# Patient Record
Sex: Female | Born: 1955 | Race: White | Hispanic: No | Marital: Single | Smoking: Former smoker
Health system: Southern US, Community
[De-identification: ages and names within clinical notes are randomized; demographics above are authoritative.]

## PROBLEM LIST (undated history)

## (undated) DIAGNOSIS — S52502A Unspecified fracture of the lower end of left radius, initial encounter for closed fracture: Secondary | ICD-10-CM

## (undated) DIAGNOSIS — T7840XA Allergy, unspecified, initial encounter: Secondary | ICD-10-CM

## (undated) DIAGNOSIS — Q2730 Arteriovenous malformation, site unspecified: Secondary | ICD-10-CM

## (undated) DIAGNOSIS — E039 Hypothyroidism, unspecified: Secondary | ICD-10-CM

## (undated) DIAGNOSIS — J45909 Unspecified asthma, uncomplicated: Secondary | ICD-10-CM

## (undated) DIAGNOSIS — F419 Anxiety disorder, unspecified: Secondary | ICD-10-CM

## (undated) DIAGNOSIS — K219 Gastro-esophageal reflux disease without esophagitis: Secondary | ICD-10-CM

## (undated) DIAGNOSIS — M797 Fibromyalgia: Secondary | ICD-10-CM

## (undated) DIAGNOSIS — E785 Hyperlipidemia, unspecified: Secondary | ICD-10-CM

## (undated) DIAGNOSIS — M722 Plantar fascial fibromatosis: Secondary | ICD-10-CM

## (undated) DIAGNOSIS — R569 Unspecified convulsions: Secondary | ICD-10-CM

## (undated) HISTORY — DX: Arteriovenous malformation, site unspecified: Q27.30

## (undated) HISTORY — DX: Hyperlipidemia, unspecified: E78.5

## (undated) HISTORY — DX: Fibromyalgia: M79.7

## (undated) HISTORY — PX: CHOLECYSTECTOMY: SHX55

## (undated) HISTORY — PX: CRANIOTOMY: SHX93

## (undated) HISTORY — DX: Allergy, unspecified, initial encounter: T78.40XA

## (undated) HISTORY — DX: Plantar fascial fibromatosis: M72.2

## (undated) HISTORY — DX: Hypothyroidism, unspecified: E03.9

## (undated) HISTORY — DX: Unspecified asthma, uncomplicated: J45.909

---

## 2003-10-06 ENCOUNTER — Encounter: Payer: Self-pay | Admitting: Cardiology

## 2008-07-16 ENCOUNTER — Ambulatory Visit: Payer: Self-pay | Admitting: Vascular Surgery

## 2008-07-16 ENCOUNTER — Encounter: Payer: Self-pay | Admitting: Cardiology

## 2010-07-18 ENCOUNTER — Encounter: Payer: Self-pay | Admitting: Cardiology

## 2010-09-02 ENCOUNTER — Encounter: Payer: Self-pay | Admitting: Cardiology

## 2010-09-18 ENCOUNTER — Encounter: Payer: Self-pay | Admitting: Cardiology

## 2010-12-02 ENCOUNTER — Encounter: Payer: Self-pay | Admitting: Cardiology

## 2011-01-06 ENCOUNTER — Encounter: Payer: Self-pay | Admitting: Cardiology

## 2011-01-06 ENCOUNTER — Ambulatory Visit
Admission: RE | Admit: 2011-01-06 | Discharge: 2011-01-06 | Payer: Self-pay | Source: Home / Self Care | Attending: Cardiology | Admitting: Cardiology

## 2011-01-06 DIAGNOSIS — R002 Palpitations: Secondary | ICD-10-CM | POA: Insufficient documentation

## 2011-01-06 DIAGNOSIS — R0602 Shortness of breath: Secondary | ICD-10-CM | POA: Insufficient documentation

## 2011-01-15 NOTE — Letter (Signed)
Summary: External Correspondence/ [facility]  External Correspondence/ [facility]   Imported By: Dorise Hiss 12/04/2010 14:32:36  _____________________________________________________________________  External Attachment:    Type:   Image     Comment:   External Document

## 2011-01-15 NOTE — Medication Information (Signed)
Summary: Environmental health practitioner [facility]  RX Folder/ [facility]   Imported By: Dorise Hiss 12/04/2010 14:31:14  _____________________________________________________________________  External Attachment:    Type:   Image     Comment:   External Document

## 2011-01-15 NOTE — Assessment & Plan Note (Signed)
Summary: NP-TACHYCARDIA X 2WEEKS EPISOCID?   Visit Type:  Initial Consult Primary Provider:  [facility], [facility]   History of Present Illness: 55 year old woman referred for cardiology consultation. She reports a few month history of intermittent palpitations, described as a fast heartbeat, occurs suddenly, often at rest. Symptoms usually last for a few minutes, but sometimes for "several" minutes. She notices associated shortness of breath and nausea, no syncope or chest pain however. There is no clear precipitant for these symptoms, no exertional component. Otherwise she states that she feels "okay."  She denies any prior history of symptoms in earlier years. No history of cardiac arrhythmia.  Faxed copy of old ECG from 2004 shows sinus rhythm at 85 beats per minutes with low voltage, left anterior fascicular block, and decreased R wave progression.  Preventive Screening-Counseling & Management  Alcohol-Tobacco     Smoking Status: quit     Year Started: 1974     Year Quit: 2001     Pack years: 2pks/day 27years  Current Medications (verified): 1)  Synthroid 125 Mcg Tabs (Levothyroxine Sodium) .... Take 1 Tablet By Mouth Once A Day 2)  Proair Hfa 108 (90 Base) Mcg/act Aers (Albuterol Sulfate) .... 2 Puffs Four Times A Day As Needed 3)  Savella 50 Mg Tabs (Milnacipran Hcl) .... Take 1 Tablet By Mouth Two Times A Day 4)  Zanaflex 4 Mg Tabs (Tizanidine Hcl) .... Take 1 Tablet By Mouth Three Times A Day 5)  Singulair 10 Mg Tabs (Montelukast Sodium) .... Take 1 Tablet By Mouth Once A Day 6)  Cetirizine Hcl 10 Mg Tabs (Cetirizine Hcl) .... Take 1 Tablet By Mouth Once A Day 7)  Stool Softener 100 Mg Caps (Docusate Sodium) .... Take 1 Tablet By Mouth Two Times A Day 8)  Aspir-Low 81 Mg Tbec (Aspirin) .... Take 1 Tablet By Mouth Once A Day 9)  Alprazolam 0.5 Mg Tabs (Alprazolam) .... Take 1 Tablet By Mouth Two Times A Day As Needed 10)  Vitamin D3 2000 Unit Caps  (Cholecalciferol) .... Take 1 Tablet By Mouth Two Times A Day 11)  Super B Complex  Tabs (B Complex-C) .... Take 1 Tablet By Mouth Two Times A Day 12)  Caltrate 600 1500 Mg Tabs (Calcium Carbonate) .... Take 1 Tablet By Mouth Two Times A Day 13)  Zinc 50 Mg Tabs (Zinc) .... Take 1 Tablet By Mouth Two Times A Day 14)  Multivitamins  Tabs (Multiple Vitamin) .... Take 1 Tablet By Mouth Once A Day 15)  Kelp  Tabs (Iodine (Kelp)) .... Take 1 Tablet By Mouth Once A Day 16)  Ibuprofen 800 Mg Tabs (Ibuprofen) .... Take 1 Tablet By Mouth Two Times A Day As Needed  Allergies (verified): 1)  ! Celexa 2)  ! * Tramadol 3)  ! * Cherry Flavor  Comments:  Administrator, Civil Service: The patient's medication list and allergies were reviewed with the patient and were updated in the Medication and Allergy Lists.  Past History:  Past Surgical History: Last updated: 01/05/2011 Cholecystectomy Craniotomy for AVM  Family History: Last updated: January 25, 2011 Father: died age 88 with emphysema Mother: no reported cardiac disease Siblings: no reported cardiac disease  Social History: Last updated: 01-25-2011 Retired  Tobacco Use - Former.  Alcohol Use - no Married   Past Medical History: Hypothyroidism Hyperlipidemia Asthma Intracranial AVM with seizures Fibromyalgia  Family History: Father: died age 7 with emphysema Mother: no reported cardiac disease Siblings: no reported cardiac disease  Social History: Retired  Tobacco  Use - Former.  Alcohol Use - no Married  Pack years:  2pks/day 27years  Review of Systems  The patient denies anorexia, fever, weight loss, chest pain, syncope, dyspnea on exertion, peripheral edema, prolonged cough, hemoptysis, melena, hematochezia, and severe indigestion/heartburn.         Describes some skin changes in her hands suggestive of Raynaud's phenomenon, otherwise reviewed and negative except as outlined.  Vital Signs:  Patient profile:   55 year old  female Height:      60 inches Weight:      191 pounds BMI:     37.44 Pulse rate:   79 / minute BP sitting:   123 / 76  (left arm) Cuff size:   large  Vitals Entered By: Carlye Grippe (January 06, 2011 8:28 AM)  Nutrition Counseling: Patient's BMI is greater than 25 and therefore counseled on weight management options.  Physical Exam  Additional Exam:  Overweight woman in no acute distress. HEENT: Conjunctiva and lids normal, oropharynx with moist mucosa. Neck: Supple, no elevated JVP or bruits, no thyromegaly or thyroid tenderness. Lungs: Clear to auscultation, nonlabored. Cardiac: Regular rate and rhythm, no S3, no significant systolic murmur. Abdomen: Soft, nontender, bowel sounds present. Skin: Warm and dry area of Musculoskeletal: No kyphosis. Extremities: No pitting edema, distal pulses full. Neuropsychiatric: Alert and oriented x3, affect appropriate.   EKG  Procedure date:  01/06/2011  Findings:      Normal sinus rhythm at 76 beats per minute, low voltage, PR interval 124 ms, no definite delta waves.  Impression & Recommendations:  Problem # 1:  PALPITATIONS, RECURRENT (ICD-785.1)  Episodic, as described above. No associated chest pain or syncope. Possibility of PSVT or perhaps even paroxysmal atrial fibrillation to be considered. Baseline ECG shows a short PR interval, no definite evidence of preexcitation however. No major findings noted on cardiac examination. Plan at this point is to provide an event recorder to obtain more objective information in association with symptoms. Then bring her back to the office to discuss the results. No new medications added.  Her updated medication list for this problem includes:    Aspir-low 81 Mg Tbec (Aspirin) .Marland Kitchen... Take 1 tablet by mouth once a day  Orders: Cardionet/Event Monitor (Cardionet/Event)  Problem # 2:  DYSPNEA (ICD-786.05)  Noted in association with above symptoms, no progressive dyspnea on exertion however.  Patient reports good control of her asthma, very infrequent inhaler use.  Her updated medication list for this problem includes:    Aspir-low 81 Mg Tbec (Aspirin) .Marland Kitchen... Take 1 tablet by mouth once a day  Other Orders: EKG w/ Interpretation (93000)  Patient Instructions: 1)  Follow up with Dr. Diona Browner on Friday, February 06, 2011 at 2pm. 2)  Your physician has recommended that you wear an event monitor.  Event monitors are medical devices that record the heart's electrical activity. Doctors most often use these monitors to diagnose arrhythmias. Arrhythmias are problems with the speed or rhythm of the heartbeat. The monitor is a small, portable device. You can wear one while you do your normal daily activities. This is usually used to diagnose what is causing palpitations/syncope (passing out). 3)  Your physician recommends that you continue on your current medications as directed. Please refer to the Current Medication list given to you today.

## 2011-01-15 NOTE — Consult Note (Signed)
Summary: Consultation Report/  VASCULAR  Consultation Report/  VASCULAR   Imported By: Dorise Hiss 01/06/2011 08:32:58  _____________________________________________________________________  External Attachment:    Type:   Image     Comment:   External Document

## 2011-01-21 NOTE — Letter (Signed)
Summary: Internal Other/ PATIENT HISTORY FORM  Internal Other/ PATIENT HISTORY FORM   Imported By: Dorise Hiss 01/14/2011 10:48:14  _____________________________________________________________________  External Attachment:    Type:   Image     Comment:   External Document

## 2011-02-03 ENCOUNTER — Encounter: Payer: Self-pay | Admitting: Cardiology

## 2011-02-06 ENCOUNTER — Encounter: Payer: Self-pay | Admitting: Physician Assistant

## 2011-02-06 ENCOUNTER — Ambulatory Visit (INDEPENDENT_AMBULATORY_CARE_PROVIDER_SITE_OTHER): Payer: PRIVATE HEALTH INSURANCE | Admitting: Cardiology

## 2011-02-06 DIAGNOSIS — R0602 Shortness of breath: Secondary | ICD-10-CM

## 2011-02-06 DIAGNOSIS — E039 Hypothyroidism, unspecified: Secondary | ICD-10-CM | POA: Insufficient documentation

## 2011-02-06 DIAGNOSIS — R002 Palpitations: Secondary | ICD-10-CM

## 2011-02-10 NOTE — Procedures (Signed)
Summary: [facility] Final Summary  [facility] Final Summary   Imported By: Cyril Loosen, RN, BSN 02/05/2011 16:57:25  _____________________________________________________________________  External Attachment:    Type:   Image     Comment:   External Document  Appended Document: [facility] Final Summary Pt will be notified of results at ov today

## 2011-02-10 NOTE — Assessment & Plan Note (Signed)
Summary: 3 WK F/U PER 1/24 OV-JM   Visit Type:  Follow-up Primary Provider:  Prudy Feeler, PA   History of Present Illness: patient returns in followup, for review of a recent event monitor, per Dr. Diona Browner, for evaluation of palpitations with associated dyspnea and chest pain. She recently presented to [place] here in the clinic for initial consultation, with no prior history of heart disease.  The results of the study yielded NSR with occasional sinus tachycardia at 150 bpm, and rare PVCs. There was no definite evidence of atrial fibrillation/flutter, or PSVT. Of note, the patient did report SOB and CP, during which she was in documented NSR in the 90 bpm range.  Patient otherwise denies exertional CP or SOB. Her palpitations have been occurring over the last 2-3 months, typically several times a week.  Of note, patient has hypothyroidism and is on Synthroid. She had a TSH of level of 0.26, back in September, with a normal T4.  Preventive Screening-Counseling & Management  Alcohol-Tobacco     Smoking Status: quit     Year Quit: 10-15 yrs  Current Medications (verified): 1)  Synthroid 125 Mcg Tabs (Levothyroxine Sodium) .... Take 1 Tablet By Mouth Once A Day 2)  Proair Hfa 108 (90 Base) Mcg/act Aers (Albuterol Sulfate) .... 2 Puffs Four Times A Day As Needed 3)  Savella 50 Mg Tabs (Milnacipran Hcl) .... Take 1 Tablet By Mouth Two Times A Day 4)  Zanaflex 4 Mg Tabs (Tizanidine Hcl) .... Take 1 Tablet By Mouth Three Times A Day 5)  Singulair 10 Mg Tabs (Montelukast Sodium) .... Take 1 Tablet By Mouth Once A Day 6)  Cetirizine Hcl 10 Mg Tabs (Cetirizine Hcl) .... Take 1 Tablet By Mouth Once A Day 7)  Stool Softener 100 Mg Caps (Docusate Sodium) .... Take 1 Tablet By Mouth Two Times A Day 8)  Aspir-Low 81 Mg Tbec (Aspirin) .... Take 1 Tablet By Mouth Once A Day 9)  Alprazolam 0.5 Mg Tabs (Alprazolam) .... Take 1 Tablet By Mouth Two Times A Day As Needed 10)  Vitamin D3 2000 Unit Caps  (Cholecalciferol) .... Take 1 Tablet By Mouth Two Times A Day 11)  Super B Complex  Tabs (B Complex-C) .... Take 1 Tablet By Mouth Two Times A Day 12)  Caltrate 600 1500 Mg Tabs (Calcium Carbonate) .... Take 1 Tablet By Mouth Two Times A Day 13)  Zinc 50 Mg Tabs (Zinc) .... Take 1 Tablet By Mouth Two Times A Day 14)  Multivitamins  Tabs (Multiple Vitamin) .... Take 1 Tablet By Mouth Once A Day 15)  Kelp  Tabs (Iodine (Kelp)) .... Take 1 Tablet By Mouth Once A Day 16)  Ibuprofen 800 Mg Tabs (Ibuprofen) .... Take 1 Tablet By Mouth Two Times A Day As Needed  Allergies (verified): 1)  ! Celexa 2)  ! * Tramadol 3)  ! * Cherry Flavor  Comments:  Nurse/Medical Assistant: Patient states all meds are the same as last  visit.  Did bring list at last OV.  Did not bring list or bottles today.  Past History:  Past Medical History: Last updated: 01/06/2011 Hypothyroidism Hyperlipidemia Asthma Intracranial AVM with seizures Fibromyalgia  Review of Systems       No fevers, chills, hemoptysis, dysphagia, melena, hematocheezia, hematuria, rash, claudication, orthopnea, pnd, pedal edema. All other systems negative.   Vital Signs:  Patient profile:   55 year old female Weight:      195.25 pounds Pulse rate:   78 /  minute BP sitting:   113 / 78  (left arm) Cuff size:   regular  Vitals Entered By: Hoover Brunette, LPN (February 06, 2011 2:12 PM) Is Patient Diabetic? No Comments 3 week f/u   Physical Exam  Additional Exam:  GEN: 55 year old female, obese, sitting upright, no distress HEENT: NCAT,PERRLA,EOMI NECK: palpable pulses, no bruits; no JVD; no TM LUNGS: CTA bilaterally HEART: RRR (S1S2); no significant murmurs; no rubs; no gallops ABD: protuberant, intact BS EXT: no significant edema SKIN: warm, dry MUSC: no obvious deformity NEURO: A/O (x3)     Impression & Recommendations:  Problem # 1:  PALPITATIONS, RECURRENT (ICD-785.1)  no further workup indicated. recent event  monitor is reassuring, indicating NSR and occasional ST, with no evidence of dysrhythmia. Of note, given the associated SOB with these palpitations, we will order a 2-D echocardiogram for assessment of LVF. If this is reassuring, the patient will be notified by phone, and can resume regular followup with her primary care physician.  Problem # 2:  HYPOTHYROIDISM (ICD-244.9)  the patient has been instructed to to schedule early followup with Dr. Charm Barges, for reassessment of her TSH level. In this context of persistent palpitations, it may be that she needs to cut back on her Synthroid dose. She had a TSH level 0.26, back in September.  Problem # 3:  DYSPNEA (ICD-786.05)  we'll evaluate further with a 2-D echo.  Other Orders: 2-D Echocardiogram (2D Echo)  Patient Instructions: 1)  Your physician has requested that you have an echocardiogram.  Echocardiography is a painless test that uses sound waves to create images of your heart. It provides your doctor with information about the size and shape of your heart and how well your heart's chambers and valves are working.  This procedure takes approximately one hour. There are no restrictions for this procedure. 2)  See Dr. Charm Barges in 1-2 weeks for follow up (need to have TSH done). 3)  Follow up will be based on Echo results.

## 2011-02-13 ENCOUNTER — Encounter: Payer: Self-pay | Admitting: Physician Assistant

## 2011-02-13 DIAGNOSIS — R0602 Shortness of breath: Secondary | ICD-10-CM

## 2011-02-18 ENCOUNTER — Encounter (INDEPENDENT_AMBULATORY_CARE_PROVIDER_SITE_OTHER): Payer: Self-pay | Admitting: *Deleted

## 2011-02-24 NOTE — Letter (Signed)
Summary: Engineer, materials at [facility]  [address]   [place], [place] [address]   Phone: [phone]  Fax: [phone]        February 18, 2011 MRN: [id number]    [facility] [address] [place], [place]  [address]    Dear Ms. Salois,  Your test ordered by Selena Batten has been reviewed by your physician (or physician assistant) and was found to be normal or stable. Your physician (or physician assistant) felt no changes were needed at this time.  _X_ Echocardiogram-No further workup needed. You are cleared to return to primary MD. Follow up with Dr. Diona Browner on an as needed basis  ____ Cardiac Stress Test  ____ Lab Work  ____ Peripheral vascular study of arms, legs or neck  ____ CT scan or X-ray  ____ Lung or Breathing test  ____ Other:   Thank you.   Cyril Loosen, RN, BSN    Duane Boston, M.D., F.A.C.C. Thressa Sheller, M.D., F.A.C.C. Oneal Grout, M.D., F.A.C.C. Cheree Ditto, M.D., F.A.C.C. Daiva Nakayama, M.D., F.A.C.C. Kenney Houseman, M.D., F.A.C.C. Jeanne Ivan, PA-C

## 2011-04-28 NOTE — Procedures (Signed)
LOWER EXTREMITY VENOUS REFLUX EXAM   INDICATION:  Enlarged right foot varicosity.   EXAM:  Using color-flow imaging and pulse Doppler spectral analysis, the  right common femoral, superficial femoral, popliteal, posterior tibial,  greater and lesser saphenous veins are evaluated.  There is no evidence  suggesting deep venous insufficiency in the right lower extremity.   The right saphenofemoral junction is competent.  The right GSV is  competent.   The right proximal short saphenous vein was not adequately visualized.   GSV Diameter (used if found to be incompetent only)                                            Right    Left  Proximal Greater Saphenous Vein           cm       cm  Proximal-to-mid-thigh                     cm       cm  Mid thigh                                 cm       cm  Mid-distal thigh                          cm       cm  Distal thigh                              cm       cm  Knee                                      cm       cm   IMPRESSION:  1. No reflux is noted in the right greater saphenous vein.  2. The right greater saphenous vein is not aneurysmal or tortuous.  3. The deep venous system is competent.  4. There is a cystic structure measuring roughly 0.4 cm x 0.5 cm x 4      cm on the anterolateral right foot which does not appear to be      vascular in nature.  5. There is a Baker's cyst noted in the right popliteal fossa.   ___________________________________________  Quita Skye. Hart Rochester, M.D.   CH/MEDQ  D:  07/16/2008  T:  07/17/2008  Job:  [id number]

## 2011-04-28 NOTE — Consult Note (Signed)
VASCULAR SURGERY CONSULTATION   Alicia Russo, Alicia Russo  DOB:  [birth year]                                       07/16/2008  CHART#:[id number]   The patient was referred for evaluation of a possible varicose vein or  area of thrombophlebitis on the right foot.  This healthy 55 year old  lady noticed a large tender lump on her foot 4 to 8 weeks ago.  She  states that this came on fairly suddenly.  There was no trauma  associated with it.  She has no history of deep venous thrombosis,  thrombophlebitis, pulmonary emboli, varicose veins, edema in her lower  extremities, spider veins or other venous problems.  This has been  treated with an anti-inflammatory medication (Diclofenac  75 mg) with  some improvement.  She was referred for further evaluation.   PAST MEDICAL HISTORY:  1. Asthma.  2. 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  3. Negative for diabetes, hypertension, coronary artery disease,      hyperlipidemia.   PREVIOUS SURGERY:  Includes a craniotomy for the AVM and a  cholecystectomy.   FAMILY HISTORY:  Positive for diabetes in a brother and an uncle.  Negative for coronary artery disease and stroke.   SOCIAL HISTORY:  She is married, has 2 children, is retired.  Smoked 2  packs of cigarettes per day for 30 years, quit in 2005.  Does not use  alcohol.   ALLERGIES:  None.   MEDICATIONS:  Please see health history form.   PHYSICAL EXAM:  Blood pressure 132/70, heart rate 72, respirations 14.  General:  She is a healthy-appearing middle-aged female in no apparent  distress.  alert and oriented x3.  Neck is supple with 3+ carotid pulses  palpable.  No bruits are audible.  Neurologic:  Normal.  No palpable  adenopathy in the neck.  No skin rashes are noted.  Chest:  Clear to  auscultation.  Cardiovascular:  Regular rhythm with no murmurs.  Upper  extremity pulses are 3+ bilaterally.  Abdomen:  Soft, nontender with  no  palpable masses.  Extremity exam reveals 3+ femoral, popliteal and  dorsalis pedis pulses bilaterally.  There is no evidence of any  significant varicosities or spider or reticular veins in the lower  extremity.  No edema is noted.  There is a tubular cystic structure on  the dorsum of the right foot running in a transverse direction, which  does not appear to be connected to the venous system.  It is distended  and is about 4 cm in length and about 0.5 cm in diameter.  It is not red  and inflamed in appearance.  There are no palpable cords in the calf and  no calf tenderness.   Venous duplex exam was performed which revealed no evidence of any  venous disease with no deep venous thrombosis or reflux in the right  great saphenous vein.  Small saphenous vein was not visualized.  The  cystic structure on the right foot does not appear to be a vascular  structure with no flow being noted.  Arterial Dopplers were normal with  normal pressures and wave forms in both feet.   I do not think this is a varicosity or an area of thrombophlebitis.  Possibly, it is a  ganglion cyst although it is somewhat unusual.  I do  not think she needs any further vascular workup and I will refer her  back to Dr. Ulice Brilliant for further evaluation.   Alicia Russo, M.D.  Electronically Signed  JDL/MEDQ  D:  07/16/2008  T:  07/17/2008  Job:  [id number]   cc:   Denny Peon. Ulice Brilliant, D.P.M.  Dr. Prudy Feeler

## 2012-06-03 ENCOUNTER — Encounter: Payer: Self-pay | Admitting: Cardiology

## 2014-12-14 HISTORY — PX: FOOT SURGERY: SHX648

## 2015-01-06 LAB — CBC
HCT: 45.7 % (ref 35.0–47.0)
HCT: 43.8 % (ref 35.0–47.0)
HGB: 15 g/dL (ref 12.0–16.0)
HGB: 14 g/dL (ref 12.0–16.0)
MCH: 29.5 pg (ref 26.0–34.0)
MCH: 29.1 pg (ref 26.0–34.0)
MCHC: 32.9 g/dL (ref 32.0–36.0)
MCHC: 32 g/dL (ref 32.0–36.0)
MCV: 90 fL (ref 80–100)
MCV: 91 fL (ref 80–100)
PLATELETS: 314 10*3/uL (ref 150–440)
Platelet: 347 10*3/uL (ref 150–440)
RBC: 5.1 10*6/uL (ref 3.80–5.20)
RBC: 4.83 10*6/uL (ref 3.80–5.20)
RDW: 13.8 % (ref 11.5–14.5)
RDW: 14 % (ref 11.5–14.5)
WBC: 24.7 10*3/uL — AB (ref 3.6–11.0)
WBC: 19.7 10*3/uL — AB (ref 3.6–11.0)

## 2015-01-06 LAB — COMPREHENSIVE METABOLIC PANEL
ALT: 59 U/L
ANION GAP: 11 (ref 7–16)
Albumin: 3.3 g/dL — ABNORMAL LOW (ref 3.4–5.0)
Alkaline Phosphatase: 88 U/L
BILIRUBIN TOTAL: 0.6 mg/dL (ref 0.2–1.0)
BUN: 13 mg/dL (ref 7–18)
CHLORIDE: 102 mmol/L (ref 98–107)
CREATININE: 0.86 mg/dL (ref 0.60–1.30)
Calcium, Total: 8.9 mg/dL (ref 8.5–10.1)
Co2: 28 mmol/L (ref 21–32)
EGFR (African American): >60
EGFR (Non-African Amer.): >60
Glucose: 120 mg/dL — ABNORMAL HIGH (ref 65–99)
Osmolality: 283 (ref 275–301)
POTASSIUM: 3 mmol/L — AB (ref 3.5–5.1)
SGOT(AST): 41 U/L — ABNORMAL HIGH (ref 15–37)
Sodium: 141 mmol/L (ref 136–145)
TOTAL PROTEIN: 6.9 g/dL (ref 6.4–8.2)

## 2015-01-06 LAB — URINALYSIS, COMPLETE
BILIRUBIN, UR: NEGATIVE
Bacteria: NONE SEEN
Blood: NEGATIVE
Glucose,UR: NEGATIVE mg/dL (ref 0–75)
KETONE: NEGATIVE
LEUKOCYTE ESTERASE: NEGATIVE
Nitrite: NEGATIVE
PH: 7 (ref 4.5–8.0)
PROTEIN: NEGATIVE
SPECIFIC GRAVITY: 1.039 (ref 1.003–1.030)

## 2015-01-06 LAB — TROPONIN I: Troponin-I: <0.02 ng/mL

## 2015-01-06 LAB — CK TOTAL AND CKMB (NOT AT ARMC)
CK, Total: 78 U/L (ref 26–192)
CK-MB: 0.8 ng/mL (ref 0.5–3.6)

## 2015-01-06 LAB — PRO B NATRIURETIC PEPTIDE: B-TYPE NATIURETIC PEPTID: 37 pg/mL (ref 0–125)

## 2015-01-07 ENCOUNTER — Inpatient Hospital Stay: Payer: Self-pay | Admitting: Internal Medicine

## 2015-01-07 LAB — POTASSIUM: Potassium: 2.8 mmol/L — ABNORMAL LOW (ref 3.5–5.1)

## 2015-01-07 LAB — MAGNESIUM: Magnesium: 1.7 mg/dL — ABNORMAL LOW

## 2015-01-07 LAB — CLOSTRIDIUM DIFFICILE(ARMC)

## 2015-01-08 LAB — TSH: Thyroid Stimulating Horm: 0.515 u[IU]/mL

## 2015-01-08 LAB — WBCS, STOOL

## 2015-01-09 LAB — CBC WITH DIFFERENTIAL/PLATELET
Basophil #: 0 10*3/uL (ref 0.0–0.1)
Basophil %: 0.4 %
Eosinophil #: 0.3 10*3/uL (ref 0.0–0.7)
Eosinophil %: 3.5 %
HCT: 38.7 % (ref 35.0–47.0)
HGB: 12.7 g/dL (ref 12.0–16.0)
Lymphocyte #: 1.8 10*3/uL (ref 1.0–3.6)
Lymphocyte %: 25.2 %
MCH: 29.7 pg (ref 26.0–34.0)
MCHC: 32.9 g/dL (ref 32.0–36.0)
MCV: 90 fL (ref 80–100)
Monocyte #: 0.7 x10 3/mm (ref 0.2–0.9)
Monocyte %: 9.8 %
Neutrophil #: 4.5 10*3/uL (ref 1.4–6.5)
Neutrophil %: 61.1 %
Platelet: 219 10*3/uL (ref 150–440)
RBC: 4.29 10*6/uL (ref 3.80–5.20)
RDW: 14.3 % (ref 11.5–14.5)
WBC: 7.3 10*3/uL (ref 3.6–11.0)

## 2015-01-09 LAB — POTASSIUM: Potassium: 4.1 mmol/L (ref 3.5–5.1)

## 2015-01-09 LAB — MAGNESIUM: Magnesium: 2.4 mg/dL

## 2015-01-10 LAB — STOOL CULTURE

## 2015-04-14 NOTE — H&P (Signed)
PATIENT NAME:  Alicia Russo, Alicia Russo MR#:  [id number] DATE OF BIRTH:  08-Dec-1956  DATE OF ADMISSION:  01/07/2015  REFERRING PHYSICIAN:  Rebecka ApleyAllison P. Webster, MD  PRIMARY CARE PHYSICIAN:  Nonlocal.   ADMISSION DIAGNOSIS:  Nausea and vomiting.   HISTORY OF PRESENT ILLNESS:  This is a 59 year old Caucasian female who presents to the Emergency Department via EMS for sudden onset of nausea and vomiting. The patient has vomited several times today and since she has been in the Emergency Department had a few episodes of nonbloody, nonbilious emesis accompanied with nonbloody diarrhea. The patient states that her symptoms began this evening. There are no sick contacts in the home. She ate a few meals today that were shared by the other members of the household, all of whom are well. The patient does not have underlying gastrointestinal issues and denies any abdominal pain prior to these recurrent episodes of vomiting. In the Emergency Room, the patient was evaluated and found to have a leukocytosis, but otherwise normal workup. It was thought that she might have a viral gastroenteritis, and she was given a prescription for Phenergan prior to discharge home. However, immediately before leaving the Emergency Department, the patient vomited again. She was given an oral trial, which she failed, which prompted the Emergency Department to call for admission.   REVIEW OF SYSTEMS: CONSTITUTIONAL:  The patient denies fever or weakness.  EYES:  Denies blurred vision or inflammation.  EARS, NOSE, AND THROAT:  Denies tinnitus or sore throat.  RESPIRATORY:  Admits to cough and some shortness of breath.  CARDIOVASCULAR:  Denies chest pain or palpitations, orthopnea, or paroxysmal nocturnal dyspnea.  GASTROINTESTINAL:  Admits to nausea, vomiting, and diarrhea. She denies abdominal pain.  GENITOURINARY:  Denies dysuria or increased frequency or hesitancy of urination.  HEMATOLOGIC AND LYMPHATIC:  Denies easy bruising or bleeding.   ENDOCRINE:  Denies polyuria or polydipsia.  INTEGUMENTARY:  Denies rashes or lesions.  MUSCULOSKELETAL:  Denies arthralgias or myalgias.  NEUROLOGIC:  Denies numbness in her extremities or dysarthria.  PSYCHIATRIC:  Denies depression or suicidal ideation.   PAST MEDICAL HISTORY:  Asthma, venous insufficiency, gastroesophageal reflux disease, fibromyalgia, and history of cerebral aneurysm.   PAST SURGICAL HISTORY:  The patient has had her right temporal lobe removed following cerebral aneurysm. She has also had a cholecystectomy.   SOCIAL HISTORY:  The patient does not smoke, drink, or do any drugs. She [place] but is currently visiting her [place].   FAMILY HISTORY:  Significant for some coronary artery disease, but otherwise no recurrent illnesses.   MEDICATIONS: 1.  Hydrochlorothiazide, dose unknown 1 tablet p.o. daily.  2.  ProAir 90 mcg per inhalation 2 puffs inhaled every 4 hours as needed for coughing, wheezing, or shortness of breath.  3.  Singulair 10 mg 1 tablet p.o. daily.  4.  Synthroid 125 mcg 1 tablet p.o. daily.   ALLERGIES:  CELEXA, CHERRY FLAVORING, AND BEE STINGS.   PERTINENT LABORATORY RESULTS AND RADIOGRAPHIC FINDINGS:  Serum glucose is 120, BNP 37, BUN 13, creatinine 0.86, serum sodium 141, potassium 3, chloride 102, bicarbonate 28, calcium 8.9, serum albumin 3.3, total bilirubin 0.6, alkaline phosphatase 88, AST 41, and ALT is 59. Troponin is negative. White blood cell count initially 24.7 and now 19.7 after fluid resuscitation, hemoglobin 14, hematocrit 43.8, and platelet count is 314,000. MCV is 91. Urinalysis is negative for infection.   Chest x-ray shows chronic peribronchial thickening with mild increased  interstitial markings that reflect asthma. The lungs are otherwise clear. CT of the abdomen and pelvis with contrast shows no acute intra-abdominal abnormality. There are nonobstructing right renal  stones at the lower pole of the right kidney measuring up to 4 mm in size, but there is no evidence of hydronephrosis.   PHYSICAL EXAMINATION: VITAL SIGNS:  Temperature is 99, pulse 96, respirations 18, blood pressure 106/63, and pulse oximetry is 97% on room air.  GENERAL:  The patient is alert and oriented, but in mild distress, as she is vomiting.  HEENT:  Normocephalic, atraumatic. Pupils are equal, round, and reactive to light and accommodation. Extraocular movements are intact. Mucous membranes are mildly tacky.  NECK:  Trachea is midline. No adenopathy. Thyroid is nonpalpable and nontender.  CHEST:  Symmetric and atraumatic.  CARDIOVASCULAR:  Regular rate and rhythm. Normal S1, S2. No rubs, clicks, or murmurs appreciated.  LUNGS:  Clear to auscultation bilaterally. Normal effort and excursion.  ABDOMEN:  Positive bowel sounds. Soft, nontender, nondistended. No hepatosplenomegaly.  GENITOURINARY:  Deferred.  MUSCULOSKELETAL:  The patient moves all 4 extremities equally, but I have not walked the patient, as she is currently retching.  SKIN:  No rashes or lesions. Warm and dry.  EXTREMITIES:  No clubbing, cyanosis, or edema.  NEUROLOGIC:  Cranial nerves II through XII are grossly intact.  PSYCHIATRIC:  Mood is normal. Affect is congruent. The patient has fairly good insight and judgment into her medical condition.   ASSESSMENT AND PLAN:  This is a 59 year old female admitted for nausea and vomiting.   1.  Nausea and vomiting, intractable but noncyclical. The patient has not had any blood or bilious material in her emesis. She likely is suffering from viral gastroenteritis. We will continue to hydrate the patient and manage her symptomatically.  2.  Asthma. The patient admits to more cough recently. This may indicate viral exposure, as viral gastroenteritis is frequently spread via respiratory droplet. We will continue albuterol every 4 hours per her home regimen.  3.  Venous insufficiency.  We will continue hydrochlorothiazide. The patient denies high blood pressure. Thus, I will start her dose at 12.5, as we do not have her medications with [place] at this time.  4.  Morbid obesity. The patient's body mass index is 40.7. I have encouraged a balanced diet and exercise.  5.  Deep vein thrombosis prophylaxis. Heparin.  6.  Gastrointestinal prophylaxis. H2-blocker as needed for heartburn.   CODE STATUS:  The patient is a full code.   TIME SPENT ON ADMISSION ORDERS AND PATIENT CARE:  Approximately 35 minutes.    ____________________________ Kelton Pillar. Sheryle Hail, MD msd:nb D: 01/07/2015 05:44:54 ET T: 01/07/2015 06:55:09 ET JOB#: [id number]  cc: Kelton Pillar. Sheryle Hail, MD, <Dictator> Kelton Pillar Naheem Mosco MD ELECTRONICALLY SIGNED 01/09/2015 1:56

## 2015-04-14 NOTE — Discharge Summary (Signed)
PATIENT NAME:  Alicia Russo, Alicia Russo MR#:  [id number] DATE OF BIRTH:  11/25/1956  DATE OF ADMISSION:  01/07/2015 DATE OF DISCHARGE:  01/09/2015  PRIMARY CARE PHYSICIAN: Nonlocal.   FINAL DIAGNOSES:  1. Nausea, vomiting, diarrhea, suspected viral gastroenteritis versus food poisoning, since other family members were sick.  2. Clostridium difficile colitis.  3. Hypertension.  4. Hypothyroidism.  5. Headache.  6. Hypokalemia and hypomagnesemia.   MEDICATIONS ON DISCHARGE: Include Synthroid 125 mcg daily, Singulair 10 mg at bedtime, ProAir HFA 1-2 puffs inhaled once a day, Savella 100 mg 1 tablet twice a day, atorvastatin 10 mg at bedtime, Zanaflex 4 mg every 8 hours as needed, metronidazole 500 mg every 8 hours for 12 more days, ranitidine 150 mg every 12 hours. Stop taking hydrochlorothiazide and omeprazole.   DIET: Low sodium diet, regular consistency.   ACTIVITY: As tolerated.   FOLLOWUP: 1 to 2 weeks with your medical doctor.   HOSPITAL COURSE: The patient was admitted 01/07/2015 and discharged 01/09/2015, came in with nausea, vomiting, diarrhea, unsteady gait. Other family members at home had similar symptoms.   LABORATORY AND RADIOLOGICAL DATA DURING THE HOSPITAL COURSE: Included a troponin that was negative. Glucose 120, BUN 13, creatinine 0.86, sodium 141, potassium 3.0, chloride 102, CO2 of 28, calcium 8.9. Liver function tests: AST slightly elevated 41, albumin slightly low at 3.3, white blood count 24.7, hemoglobin and hematocrit 15.0 and 45.7, platelet count 347,000. Chest x-ray shows chronic pre-bronchial thickening, mild increased interstitial markings. CT scan of the abdomen and pelvis showed no acute abnormality in the abdomen and pelvis, nonobstructing right renal stone at the lower pole of the right kidney measuring up to 4 mm in size. Urinalysis negative. White count on the 24th came down to 19.7. Stool for C. diff came back positive. Stool culture negative. Stool white blood cell  negative. TSH 0.515, magnesium 2.4, potassium 4.1. White count upon discharge 7.3, hemoglobin 12.7.   HOSPITAL COURSE PER PROBLEM LIST:  1. For the patient's nausea, vomiting, and diarrhea, I did suspect viral gastroenteritis versus food poisoning, since the other family members were sick. I spoke with the patient's daughter. Other family members were doing better, so likely the initial cause. The patient was able to tolerate diet.  2. Clostridium difficile colitis. Stool for Clostridium difficile was sent off. It was positive. The patient did not have any recent antibiotics, but she was taking a proton pump inhibitor, omeprazole was stopped. That could be her risk for this. We will treat with metronidazole for a total of 14 days.  3. Essential hypertension. Blood pressure on the borderline lower side and the patient had electrolyte abnormalities, so I stopped the hydrochlorothiazide.  4. Hypothyroidism, unspecified, continue Synthroid. TSH normal range.  5. Headache. This improved a dose of Toradol and anti-inflammatory.  6. Hypokalemia and hypomagnesemia. This was replaced during the hospital course.  7. Hyperlipidemia, unspecified on atorvastatin.  8. Asthma, shortness of breath upon walking. Follow up as outpatient.   TIME SPENT ON DISCHARGE: 35 minutes  ____________________________ Herschell Dimesichard J. Renae GlossWieting, MD rjw:ap D: 01/11/2015 12:04:03 ET T: 01/11/2015 14:42:58 ET JOB#: [id number]  cc: Herschell Dimesichard J. Renae GlossWieting, MD, <Dictator> Salley ScarletICHARD J Dexter Sauser MD ELECTRONICALLY SIGNED 01/14/2015 15:41

## 2015-07-10 ENCOUNTER — Ambulatory Visit (INDEPENDENT_AMBULATORY_CARE_PROVIDER_SITE_OTHER): Payer: PPO

## 2015-07-10 ENCOUNTER — Ambulatory Visit (INDEPENDENT_AMBULATORY_CARE_PROVIDER_SITE_OTHER): Payer: PPO | Admitting: Podiatry

## 2015-07-10 ENCOUNTER — Encounter: Payer: Self-pay | Admitting: Podiatry

## 2015-07-10 VITALS — BP 125/82 | HR 81 | Resp 16 | Ht 60.0 in | Wt 198.0 lb

## 2015-07-10 DIAGNOSIS — M722 Plantar fascial fibromatosis: Secondary | ICD-10-CM

## 2015-07-10 DIAGNOSIS — M67471 Ganglion, right ankle and foot: Secondary | ICD-10-CM

## 2015-07-10 MED ORDER — MELOXICAM 15 MG PO TABS: 15.0000 mg | ORAL_TABLET | Freq: Every day | ORAL | Status: DC

## 2015-07-10 MED ORDER — METHYLPREDNISOLONE 4 MG PO TBPK: ORAL_TABLET | ORAL | Status: DC

## 2015-07-10 NOTE — Progress Notes (Signed)
   Subjective:    Patient ID: Alicia Russo, female    DOB: 1956-12-06, 59 y.o.   MRN: [id number]  HPI Right plantar heel pain for 2-3 months , have tried stretching, no help and using ibuprofen for pain    Review of Systems     Objective:   Physical Exam: I have reviewed her past medical history medications allergies surgeon social history. States that she is repaired right heel pain for the past 2-3 months has been seen by her primary physician who recommended a foot specialist. She is also concerned of a lesion to the dorsal aspect of the right foot which has been undiagnosed by multiple surgeons.  Pulses are strongly palpable bilateral. Neurologic sensorium is intact bilateral. Deep tendon reflexes are intact bilateral and equal bilateral. Muscle strength +5 over 5 dorsiflexion plantar flexors and inverters everters all edges of musculature is intact. Orthopedic evaluation of a straight awl joints distal to the ankle have full range of motion without crepitation. Cutaneous evaluation demonstrates supple well-hydrated cutis no erythema edema cellulitis drainage or odor with exception of a serpentine off tissue mass overlying the dorsal lateral aspect of Lisfranc's joint of her right foot. His mass is nonpulsatile in nature and feels fluctuant. She does have pain on palpation medial calcaneal tubercle of her right heel. Radiographs confirm soft tissue increase in density at the plantar fascial calcaneal insertion site of her right heel.        Assessment & Plan:  Assessment: Ganglion cyst dorsolateral aspect right foot. Plantar fasciitis right foot.  Plan: Discussed etiology pathology conservative versus surgical therapies. Aspirated typical ganglion type material from the lesion to the dorsal aspect of the right foot. Injected the right heel today with Kenalog and local and aesthetic. She was dispensed a plantar fascial brace and a night splint. Dispensed a prescription for Medrol Dosepak  to be followed by meloxicam. Discussed appropriate shoe gear stretching exercises ice therapy and shoe gear modifications area I will follow-up with her in 1 month.

## 2015-07-10 NOTE — Patient Instructions (Signed)

## 2015-08-07 ENCOUNTER — Encounter: Payer: Self-pay | Admitting: Podiatry

## 2015-08-07 ENCOUNTER — Ambulatory Visit (INDEPENDENT_AMBULATORY_CARE_PROVIDER_SITE_OTHER): Payer: PPO | Admitting: Podiatry

## 2015-08-07 VITALS — BP 131/78 | HR 74 | Resp 18

## 2015-08-07 DIAGNOSIS — M722 Plantar fascial fibromatosis: Secondary | ICD-10-CM | POA: Diagnosis not present

## 2015-08-07 DIAGNOSIS — M67471 Ganglion, right ankle and foot: Secondary | ICD-10-CM

## 2015-08-07 NOTE — Progress Notes (Signed)
She presents today for follow-up of plantar fasciitis and a ganglion cyst on the right foot. She states that the ganglion cyst was doing great and he came back in the foot started hurting again she states that her plantar fasciitis is doing very good and then it has started to recur as well. She states that she continues all of her splints as well as her anti-inflammatory's. She presents today in tennis shoes.  Objective: 59 year old female in no acute distress presents today with her daughter and grandson. Pulses are palpable vital signs are stable she's alert and oriented 3. Ganglion cyst has recurred but is much more fluctuance to the dorsal aspect of the right foot. The cyst runs transversely measures proximally for 4 cm in total length and a centimeter in width. She has pain on palpation medial calcaneal tubercle of the right heel.  Assessment: Slowly resolving plantar fasciitis right foot. Ganglion cyst dorsal aspect right foot.  Plan: Discussed etiology pathology conservative versus surgical therapies. At this point she's going to continue nonsterile anti-inflammatory's plantar fascial brace and night splint. She will also consider surgical intervention in the near future. I will follow up with her in 3 weeks at which time we will perform a surgical consult for one or both of these items. I also injected the right heel today with Kenalog and local and aesthetic for which she tolerated well.  Tight Zeric Baranowski DPM

## 2015-08-28 ENCOUNTER — Encounter: Payer: Self-pay | Admitting: Podiatry

## 2015-08-28 ENCOUNTER — Ambulatory Visit (INDEPENDENT_AMBULATORY_CARE_PROVIDER_SITE_OTHER): Payer: PPO | Admitting: Podiatry

## 2015-08-28 VITALS — BP 126/79 | HR 92 | Resp 18

## 2015-08-28 DIAGNOSIS — M722 Plantar fascial fibromatosis: Secondary | ICD-10-CM | POA: Diagnosis not present

## 2015-08-28 DIAGNOSIS — M67471 Ganglion, right ankle and foot: Secondary | ICD-10-CM

## 2015-08-28 NOTE — Patient Instructions (Signed)
Pre-Operative Instructions  Congratulations, you have decided to take an important step to improving your quality of life.  You can be assured that the doctors of [facility] will be with you every step of the way.  1. Plan to be at the surgery center/hospital at least 1 (one) hour prior to your scheduled time unless otherwise directed by the surgical center/hospital staff.  You must have a responsible adult accompany you, remain during the surgery and drive you home.  Make sure you have directions to the surgical center/hospital and know how to get there on time. 2. For hospital based surgery you will need to obtain a history and physical form from your family physician within 1 month prior to the date of surgery- we will give you a form for you primary physician.  3. We make every effort to accommodate the date you request for surgery.  There are however, times where surgery dates or times have to be moved.  We will contact you as soon as possible if a change in schedule is required.   4. No Aspirin/Ibuprofen for one week before surgery.  If you are on aspirin, any non-steroidal anti-inflammatory medications (Mobic, Aleve, Ibuprofen) you should stop taking it 7 days prior to your surgery.  You make take Tylenol  For pain prior to surgery.  5. Medications- If you are taking daily heart and blood pressure medications, seizure, reflux, allergy, asthma, anxiety, pain or diabetes medications, make sure the surgery center/hospital is aware before the day of surgery so they may notify you which medications to take or avoid the day of surgery. 6. No food or drink after midnight the night before surgery unless directed otherwise by surgical center/hospital staff. 7. No alcoholic beverages 24 hours prior to surgery.  No smoking 24 hours prior to or 24 hours after surgery. 8. Wear loose pants or shorts- loose enough to fit over bandages, boots, and casts. 9. No slip on shoes, sneakers are best. 10. Bring  your boot with you to the surgery center/hospital.  Also bring crutches or a walker if your physician has prescribed it for you.  If you do not have this equipment, it will be provided for you after surgery. 11. If you have not been contracted by the surgery center/hospital by the day before your surgery, call to confirm the date and time of your surgery. 12. Leave-time from work may vary depending on the type of surgery you have.  Appropriate arrangements should be made prior to surgery with your employer. 13. Prescriptions will be provided immediately following surgery by your doctor.  Have these filled as soon as possible after surgery and take the medication as directed. 14. Remove nail polish on the operative foot. 15. Wash the night before surgery.  The night before surgery wash the foot and leg well with the antibacterial soap provided and water paying special attention to beneath the toenails and in between the toes.  Rinse thoroughly with water and dry well with a towel.  Perform this wash unless told not to do so by your physician.  Enclosed: 1 Ice pack (please put in freezer the night before surgery)   1 Hibiclens skin cleaner   Pre-op Instructions  If you have any questions regarding the instructions, do not hesitate to call our office.  [place]: [address] [place], [place] [address] [phone]  [place]: [address] [place], [place] [address] [phone]  [place]: [address]  [place], [place] [address] [phone]  Dr. Richard   Tuchman DPM, Dr. Norman Regal DPM Dr. Richard Sikora DPM, Dr. M. Todd Hyatt DPM, Dr. Kathryn Egerton DPM 

## 2015-08-28 NOTE — Progress Notes (Signed)
She presents today for follow-up of a ganglion cyst which seems to be enlarging she says as well as plantar fasciitis to the right heel. She states that the Synvisc seems to be getting larger and it is more painful with shoe gear. She also states that her plantar fasciitis is slowly getting better and she continues to sleep the night splint. She was liked to consider surgical correction for both of these problems.  Objective: Vital signs are stable she is alert and oriented 3. Pulses are strongly palpable. Neurologic sensorium is intact per Semmes-Weinstein monofilament. Deep tendon reflexes are intact bilateral and muscle strength is 5 over 5 dorsiflexion plantar flexors and inverters and everters. She has an enlarging ganglion cyst to the dorsal lateral aspect of the right foot it appears to be nodular late it but it is nonpulsatile. He runs transverse to her extensor tendons some really not worried about malignancy here. She has pain on palpation medial calcaneal tubercle of her right heel with persistent plantar fasciitis.  Assessment ganglion cyst dorsal aspect right foot. Plantar fasciitis right foot. Plantar fasciitis appears to be improving and ganglion cyst is worsening.  Plan: We discussed the etiology pathology conservative versus surgical therapies. At this point we performed an injection to the right heel today. This is with Kenalog and local and aesthetic. We also went over a consent form today line by line number by number giving her ample time to ask questions and she saw fit regarding excision soft tissue mass/ganglion dorsal aspect right foot as well as endoscopic plantar fasciotomy right heel. We did discuss the pros and cons of these procedures as well as the possibility for complications which may include but are not limited to postop pain bleeding swelling infection recurrence need for further surgery. We dispensed a cam boot today and I will follow-up with her at the end of October to  November 1 for surgical intervention regarding her right foot.

## 2015-08-29 ENCOUNTER — Telehealth: Payer: Self-pay | Admitting: *Deleted

## 2015-08-29 NOTE — Telephone Encounter (Signed)
I'm calling to see if you want to schedule surgery with Dr. Al Corpus?  "Yes ma'am I do."  When would you like to schedule it?  "Can he do it on November 4th?"  Yes, that day is available.  I'll get you scheduled.  You can go ahead an register with the surgical center and it tells you how to do it in your surgical pamphlet.  "What time will it be?"  The surgical center will call you a day or two prior to surgery date and give you the arrival time.  "Okay, I'll finish registering.  I was at a stand still because I didn't have a date yet."

## 2015-10-04 ENCOUNTER — Telehealth: Payer: Self-pay | Admitting: *Deleted

## 2015-10-04 NOTE — Telephone Encounter (Signed)
I called [employer] and spoke to LomiraMonica.  She stated patient has a co-pay of $150 and $3400 out of pocket maximum, no deductible.  I then called [employer] and the automated said no pre-certification is needed for codes [id number] and [id number].

## 2015-10-07 NOTE — Telephone Encounter (Signed)
"  I'd like to reschedule my surgery."

## 2015-10-07 NOTE — Telephone Encounter (Signed)
"  I'd like to reschedule my surgery from November 4th to the beginning of the year.  Do you have a date in mind?  "I'd like to have it done after February 8th."  He can do it on February 10th.  Is that date okay?  "That will be fine."  You should hear from the surgical center a day or two prior to surgery with the arrival time.

## 2015-10-23 ENCOUNTER — Encounter: Payer: Self-pay | Admitting: Podiatry

## 2015-10-30 ENCOUNTER — Encounter: Payer: Self-pay | Admitting: Podiatry

## 2015-12-24 ENCOUNTER — Telehealth: Payer: Self-pay | Admitting: *Deleted

## 2015-12-24 NOTE — Telephone Encounter (Signed)
I spoke to my daughter.  February 24th is not good for her.  Can we just schedule it for 01/31/2016?"  Yes, that date will be fine.  I called and left a message for Aram [facility]ch[facility]Cynthia at [facility] to reschedule surgery from 02/07/2016 to 01/31/2016.  Patient's daughter stated 24th was not a good time for her.  She will be the caretaker.

## 2015-12-24 NOTE — Telephone Encounter (Signed)
I called patient and informed her that Dr. Al CorpusHyatt is not going to be able to do her surgery on 01/24/2016.  I asked if it is okay to reschedule to 01/17/2016 or 01/31/2016.  "Is it possible to do it on 02/07/2016?"  Yes, that date is available.  "I will check with my daughter and see if that date will be okay.  I will be staying with her after I have the surgery.  If it's not a good date I'll call you back."  That is fine.  Thank you so much.

## 2016-01-21 ENCOUNTER — Telehealth: Payer: Self-pay | Admitting: *Deleted

## 2016-01-21 MED ORDER — MELOXICAM 15 MG PO TABS: 15.0000 mg | ORAL_TABLET | Freq: Every day | ORAL | Status: DC

## 2016-01-21 NOTE — Telephone Encounter (Signed)
Faxed request for Meloxicam received.  Dr. Al Corpus ordered refill to equal 1 year. Done.

## 2016-01-29 ENCOUNTER — Encounter: Payer: Self-pay | Admitting: Podiatry

## 2016-01-30 ENCOUNTER — Other Ambulatory Visit: Payer: Self-pay | Admitting: Podiatry

## 2016-01-30 MED ORDER — CEPHALEXIN 500 MG PO CAPS: 500.0000 mg | ORAL_CAPSULE | Freq: Three times a day (TID) | ORAL | Status: DC

## 2016-01-30 MED ORDER — PROMETHAZINE HCL 25 MG PO TABS: 25.0000 mg | ORAL_TABLET | Freq: Three times a day (TID) | ORAL | Status: DC | PRN

## 2016-01-30 MED ORDER — OXYCODONE-ACETAMINOPHEN 10-325 MG PO TABS: ORAL_TABLET | ORAL | Status: DC

## 2016-01-31 DIAGNOSIS — M6740 Ganglion, unspecified site: Secondary | ICD-10-CM | POA: Diagnosis not present

## 2016-01-31 DIAGNOSIS — I10 Essential (primary) hypertension: Secondary | ICD-10-CM | POA: Diagnosis not present

## 2016-01-31 DIAGNOSIS — M722 Plantar fascial fibromatosis: Secondary | ICD-10-CM | POA: Diagnosis not present

## 2016-01-31 DIAGNOSIS — M67471 Ganglion, right ankle and foot: Secondary | ICD-10-CM | POA: Diagnosis not present

## 2016-01-31 DIAGNOSIS — M25571 Pain in right ankle and joints of right foot: Secondary | ICD-10-CM | POA: Diagnosis not present

## 2016-02-05 ENCOUNTER — Encounter: Payer: Self-pay | Admitting: Podiatry

## 2016-02-05 ENCOUNTER — Ambulatory Visit (INDEPENDENT_AMBULATORY_CARE_PROVIDER_SITE_OTHER): Payer: PPO | Admitting: Podiatry

## 2016-02-05 VITALS — BP 136/87 | HR 81 | Resp 18

## 2016-02-05 DIAGNOSIS — M722 Plantar fascial fibromatosis: Secondary | ICD-10-CM

## 2016-02-05 DIAGNOSIS — M67471 Ganglion, right ankle and foot: Secondary | ICD-10-CM

## 2016-02-05 DIAGNOSIS — Z9889 Other specified postprocedural states: Secondary | ICD-10-CM

## 2016-02-05 NOTE — Progress Notes (Signed)
She presents today status post one week excision ganglion cyst dorsolateral aspect of the foot followed by an endoscopic plantar fasciotomy. She states that all in all he really hasn't been sore.  Objective: Vital signs are stable she is alert and oriented 3. Pulses are palpable. Neurologic sensorium is intact. Dry sterile dressing was intact was removed demonstrates no erythema edema saline drainage or odor sutures are intact margins well coapted.  Assessment well-healing surgical foot 1 week status post EPF and ganglion cyst excision.  Plan: [place] today dressed with compressive dressing follow-up with her in 1 week

## 2016-02-07 ENCOUNTER — Telehealth: Payer: Self-pay | Admitting: *Deleted

## 2016-02-07 NOTE — Telephone Encounter (Signed)
Dr. Al Corpus states biopsy of right foot on 01/31/2016 was a ganglion cyst, request I inform pt.  Results called to pt.

## 2016-02-12 ENCOUNTER — Ambulatory Visit (INDEPENDENT_AMBULATORY_CARE_PROVIDER_SITE_OTHER): Payer: PPO | Admitting: Podiatry

## 2016-02-12 ENCOUNTER — Encounter: Payer: Self-pay | Admitting: Podiatry

## 2016-02-12 VITALS — BP 127/71 | HR 77 | Resp 16

## 2016-02-12 DIAGNOSIS — M722 Plantar fascial fibromatosis: Secondary | ICD-10-CM

## 2016-02-12 DIAGNOSIS — M67471 Ganglion, right ankle and foot: Secondary | ICD-10-CM | POA: Diagnosis not present

## 2016-02-12 DIAGNOSIS — Z9889 Other specified postprocedural states: Secondary | ICD-10-CM

## 2016-02-13 NOTE — Progress Notes (Signed)
She presents today 2 weeks status post EPF right foot and excision ganglion cyst dorsal aspect right foot. She states that she is doing very well and has really had no problems. She presents with her cam walker ambulating regularly today. She denies fever chills nausea vomiting muscle aches and pains chest pain or shortness of breath.  Objective: Vital signs stable alert and oriented 3. Pulses are palpable. Sutures were intact and margins are coapted we removed the sutures today margins remain well coapted though I did place Steri-Strips. There is no signs of infection or dehiscence or redevelopment of the cyst.  Assessment well-healing surgical foot status post excision ganglion cyst and an EPF right foot.  Plan: I would allow her to start getting this wet in utilizing a regular shoe gear and I will follow-up with her in 1-2 weeks.

## 2016-02-19 ENCOUNTER — Encounter: Payer: Self-pay | Admitting: Podiatry

## 2016-02-26 ENCOUNTER — Ambulatory Visit (INDEPENDENT_AMBULATORY_CARE_PROVIDER_SITE_OTHER): Payer: PPO | Admitting: Podiatry

## 2016-02-26 ENCOUNTER — Encounter: Payer: Self-pay | Admitting: Podiatry

## 2016-02-26 VITALS — BP 127/79 | HR 84 | Resp 16

## 2016-02-26 DIAGNOSIS — Z9889 Other specified postprocedural states: Secondary | ICD-10-CM

## 2016-02-26 DIAGNOSIS — M67471 Ganglion, right ankle and foot: Secondary | ICD-10-CM

## 2016-02-26 DIAGNOSIS — M722 Plantar fascial fibromatosis: Secondary | ICD-10-CM

## 2016-02-26 NOTE — Progress Notes (Signed)
She presents today for follow-up of her endoscopic plantar fasciotomy right foot and excision ganglion cyst right foot. Her date of surgery 01/31/2016. She states that she is 100% improved. She is very happy with the outcome.  Objective: Vital signs are stable she is alert and oriented 3. Pulses are strongly palpable. Incision sites are gone on to heal uneventfully. She has no pain on palpation of any of the incision sites. No pain on palpation of the medial calcaneal tubercle.  Assessment: Well-healing surgical foot.  Plan: I encouraged her to continue the use of her night splint for 2 more weeks and I will follow-up with her on an as-needed basis.

## 2016-03-31 ENCOUNTER — Encounter: Payer: Self-pay | Admitting: Podiatry

## 2016-04-13 DIAGNOSIS — E785 Hyperlipidemia, unspecified: Secondary | ICD-10-CM | POA: Diagnosis not present

## 2016-04-13 DIAGNOSIS — I10 Essential (primary) hypertension: Secondary | ICD-10-CM | POA: Diagnosis not present

## 2016-04-13 DIAGNOSIS — M797 Fibromyalgia: Secondary | ICD-10-CM | POA: Diagnosis not present

## 2016-04-13 DIAGNOSIS — E039 Hypothyroidism, unspecified: Secondary | ICD-10-CM | POA: Diagnosis not present

## 2016-04-22 DIAGNOSIS — E039 Hypothyroidism, unspecified: Secondary | ICD-10-CM | POA: Diagnosis not present

## 2016-04-22 DIAGNOSIS — I10 Essential (primary) hypertension: Secondary | ICD-10-CM | POA: Diagnosis not present

## 2016-04-22 DIAGNOSIS — E785 Hyperlipidemia, unspecified: Secondary | ICD-10-CM | POA: Diagnosis not present

## 2016-04-22 DIAGNOSIS — M797 Fibromyalgia: Secondary | ICD-10-CM | POA: Diagnosis not present

## 2016-05-10 DIAGNOSIS — H6503 Acute serous otitis media, bilateral: Secondary | ICD-10-CM | POA: Diagnosis not present

## 2016-05-10 DIAGNOSIS — J40 Bronchitis, not specified as acute or chronic: Secondary | ICD-10-CM | POA: Diagnosis not present

## 2016-05-10 DIAGNOSIS — J040 Acute laryngitis: Secondary | ICD-10-CM | POA: Diagnosis not present

## 2016-06-26 DIAGNOSIS — R05 Cough: Secondary | ICD-10-CM | POA: Diagnosis not present

## 2016-06-26 DIAGNOSIS — J329 Chronic sinusitis, unspecified: Secondary | ICD-10-CM | POA: Diagnosis not present

## 2016-08-03 ENCOUNTER — Encounter (INDEPENDENT_AMBULATORY_CARE_PROVIDER_SITE_OTHER): Payer: Self-pay

## 2016-08-03 ENCOUNTER — Encounter: Payer: Self-pay | Admitting: Physician Assistant

## 2016-08-03 ENCOUNTER — Ambulatory Visit (INDEPENDENT_AMBULATORY_CARE_PROVIDER_SITE_OTHER): Payer: PPO | Admitting: Physician Assistant

## 2016-08-03 VITALS — BP 131/86 | HR 76 | Temp 98.3°F | Ht 60.0 in | Wt 203.2 lb

## 2016-08-03 DIAGNOSIS — W540XXA Bitten by dog, initial encounter: Secondary | ICD-10-CM | POA: Diagnosis not present

## 2016-08-03 DIAGNOSIS — Z6839 Body mass index (BMI) 39.0-39.9, adult: Secondary | ICD-10-CM

## 2016-08-03 DIAGNOSIS — S81852A Open bite, left lower leg, initial encounter: Secondary | ICD-10-CM | POA: Insufficient documentation

## 2016-08-03 DIAGNOSIS — L03116 Cellulitis of left lower limb: Secondary | ICD-10-CM

## 2016-08-03 MED ORDER — CEPHALEXIN 500 MG PO CAPS: 500.0000 mg | ORAL_CAPSULE | Freq: Four times a day (QID) | ORAL | 1 refills | Status: DC

## 2016-08-03 NOTE — Patient Instructions (Signed)

## 2016-08-03 NOTE — Progress Notes (Signed)
BP 131/86 (BP Location: Left Wrist, Patient Position: Sitting, Cuff Size: Small)   Pulse 76   Temp 98.3 F (36.8 C) (Oral)   Ht 5' (1.524 m)   Wt 203 lb 3.2 oz (92.2 kg)   BMI 39.68 kg/m    Subjective:    Patient ID: Alicia Russo, female    DOB: 02/26/1956, 60 y.o.   MRN: [id number]  HPI: Alicia Russo is a 60 y.o. female presenting on 08/03/2016 for Animal Bite (Happened Wednesday morning. Left leg- swelling, Patient states that dog is up to date on shots. )   HPI this patient comes in for a problem focused. She has been an established patient with me over many years [facility].  On August 16 she was visiting a neighbor and was bitten by a dog on her lower left leg. The dogs are completely up-to-date and her friend's house. She is current on her vaccines. She had been on Augmentin for sinus infection and has been cleaning the wound very well every day. She states she still has some soreness. And at this time the posterior area of the left leg is still quite tender and is read below it. She denies any fever or chills. She denies any drainage from the wound.   Relevant past medical, surgical, family and social history reviewed and updated as indicated. Interim medical history since our last visit reviewed. Allergies and medications reviewed and updated.  Review of Systems  Constitutional: Negative.  Negative for activity change, fatigue and fever.  HENT: Negative.   Eyes: Negative.   Respiratory: Negative.  Negative for cough.   Cardiovascular: Negative.  Negative for chest pain.  Gastrointestinal: Negative.  Negative for abdominal pain.  Endocrine: Negative.   Genitourinary: Negative.  Negative for dysuria.  Musculoskeletal: Negative.   Skin: Positive for color change, rash and wound.  Neurological: Negative.     Per HPI unless specifically indicated above  Social History   Social History  . Marital status: Married    Spouse name:  N/A  . Number of children: N/A  . Years of education: N/A   Occupational History  . Not on file.   Social History Main Topics  . Smoking status: Former Games developermoker  . Smokeless tobacco: Never Used  . Alcohol use No  . Drug use: No  . Sexual activity: Not on file   Other Topics Concern  . Not on file   Social History Narrative  . No narrative on file    Past Surgical History:  Procedure Laterality Date  . CHOLECYSTECTOMY    . CRANIOTOMY     for AVM    Family History  Problem Relation Age of Onset  . Emphysema Father     died at age 60      Medication List       Accurate as of 08/03/16 12:54 PM. Always use your most recent med list.          albuterol 108 (90 Base) MCG/ACT inhaler Commonly known as:  PROVENTIL HFA;VENTOLIN HFA Inhale 2 puffs into the lungs 4 (four) times daily as needed.   ALPRAZolam 0.5 MG tablet Commonly known as:  XANAX Take 0.5 mg by mouth 2 (two) times daily as needed.   aspirin 81 MG tablet Take 81 mg by mouth daily.   Biotin 10 MG Tabs Take by mouth.   calcium carbonate 1250 MG capsule Take 1,250 mg by mouth 2 (two) times daily with  a meal.   cephALEXin 500 MG capsule Commonly known as:  KEFLEX Take 1 capsule (500 mg total) by mouth 4 (four) times daily.   fluticasone 50 MCG/ACT nasal spray Commonly known as:  FLONASE Place into both nostrils daily.   ibuprofen 800 MG tablet Commonly known as:  ADVIL,MOTRIN Take 800 mg by mouth 2 (two) times daily as needed.   levothyroxine 125 MCG tablet Commonly known as:  SYNTHROID, LEVOTHROID Take 125 mcg by mouth daily.   magnesium 30 MG tablet Take 30 mg by mouth 2 (two) times daily.   montelukast 10 MG tablet Commonly known as:  SINGULAIR Take 10 mg by mouth at bedtime.   multivitamin tablet Take 1 tablet by mouth daily.   omeprazole 20 MG capsule Commonly known as:  PRILOSEC Take 20 mg by mouth daily.   SAVELLA 100 MG Tabs tablet Generic drug:  Milnacipran HCl Take 100  mg by mouth 2 (two) times daily.   SUPER B COMPLEX/C PO Take 1 tablet by mouth 2 (two) times daily.   tiZANidine 4 MG tablet Commonly known as:  ZANAFLEX Take 4 mg by mouth 3 (three) times daily.   Vitamin D3 2000 units Tabs Take 1 tablet by mouth 2 (two) times daily.   zinc gluconate 50 MG tablet Take 50 mg by mouth 2 (two) times daily.          Objective:    BP 131/86 (BP Location: Left Wrist, Patient Position: Sitting, Cuff Size: Small)   Pulse 76   Temp 98.3 F (36.8 C) (Oral)   Ht 5' (1.524 m)   Wt 203 lb 3.2 oz (92.2 kg)   BMI 39.68 kg/m   Wt Readings from Last 3 Encounters:  08/03/16 203 lb 3.2 oz (92.2 kg)  07/10/15 198 lb (89.8 kg)  02/06/11 195 lb 4 oz (88.6 kg)    Physical Exam  Constitutional: She is oriented to person, place, and time. She appears well-developed and well-nourished.  HENT:  Head: Normocephalic and atraumatic.  Eyes: EOM are normal.  Neck: Neck supple.  Cardiovascular: Normal rate, regular rhythm, normal heart sounds and intact distal pulses.   Pulmonary/Chest: Effort normal and breath sounds normal.  Abdominal: Bowel sounds are normal.  Musculoskeletal: She exhibits edema and tenderness.       Right shoulder: She exhibits normal range of motion.       Left ankle: She exhibits decreased range of motion, swelling and deformity.  Neurological: She is alert and oriented to person, place, and time. She has normal reflexes.  Skin: Skin is warm and dry. Bruising and rash noted. There is erythema.     Psychiatric: She has a normal mood and affect. Her behavior is normal. Judgment and thought content normal.  Nursing note and vitals reviewed.        Assessment & Plan:   Problem List Items Addressed This Visit      Other   Dog bite of left calf   Relevant Medications   cephALEXin (KEFLEX) 500 MG capsule   Cellulitis of left leg   Relevant Medications   cephALEXin (KEFLEX) 500 MG capsule    Other Visit Diagnoses    Body mass index  39.0-39.9, adult    -  Primary       Follow up plan: Return in about 2 months (around 10/03/2016) for medication and lab.  Remus LofflerAngel S. Deniese Oberry PA-C [facility] [address]  [place], [place] [id number] [phone]   08/03/2016, 12:54 PM

## 2016-08-07 ENCOUNTER — Encounter: Payer: Self-pay | Admitting: *Deleted

## 2016-08-07 NOTE — Progress Notes (Signed)
   DOS 01-31-16  EPF right and Excision ganglion right

## 2016-08-20 DIAGNOSIS — Z0289 Encounter for other administrative examinations: Secondary | ICD-10-CM

## 2016-09-07 ENCOUNTER — Encounter: Payer: Self-pay | Admitting: Physician Assistant

## 2016-09-07 ENCOUNTER — Ambulatory Visit (INDEPENDENT_AMBULATORY_CARE_PROVIDER_SITE_OTHER): Payer: PPO | Admitting: Physician Assistant

## 2016-09-07 VITALS — BP 120/78 | HR 100 | Temp 97.1°F | Resp 16 | Ht 60.0 in | Wt 200.4 lb

## 2016-09-07 DIAGNOSIS — J209 Acute bronchitis, unspecified: Secondary | ICD-10-CM | POA: Diagnosis not present

## 2016-09-07 MED ORDER — METHYLPREDNISOLONE ACETATE 80 MG/ML IJ SUSP
80.0000 mg | Freq: Once | INTRAMUSCULAR | Status: AC
Start: 2016-09-07 — End: 2016-09-07
  Administered 2016-09-07: 80 mg via INTRAMUSCULAR

## 2016-09-07 MED ORDER — AZITHROMYCIN 250 MG PO TABS: ORAL_TABLET | ORAL | 0 refills | Status: DC

## 2016-09-07 MED ORDER — ALBUTEROL SULFATE HFA 108 (90 BASE) MCG/ACT IN AERS: 2.0000 | INHALATION_SPRAY | Freq: Four times a day (QID) | RESPIRATORY_TRACT | 6 refills | Status: DC | PRN

## 2016-09-07 NOTE — Patient Instructions (Signed)

## 2016-09-07 NOTE — Progress Notes (Signed)
BP 120/78   Pulse 100   Temp 97.1 F (36.2 C) (Oral)   Resp 16   Ht 5' (1.524 m)   Wt 200 lb 6.4 oz (90.9 kg)   SpO2 100%   BMI 39.14 kg/m    Subjective:    Patient ID: Alicia Russo, female    DOB: 05/05/1956, 60 y.o.   MRN: [id number]  HPI: Alicia MeLynn S Chacko is a 60 y.o. female presenting on 09/07/2016 for Shortness of Breath and Wheezing  History of cough and congestion greater than one week. Cough is worsening.  OTC meds have not helped. Denied fever or chills. Does have fatigue and copious cough, especially at night. Some production with yellow coloer, denies blood.  Relevant past medical, surgical, family and social history reviewed and updated as indicated. Interim medical history since our last visit reviewed. Allergies and medications reviewed and updated. DATA REVIEWED: CHART IN EPIC  Social History   Social History  . Marital status: Married    Spouse name: N/A  . Number of children: N/A  . Years of education: N/A   Occupational History  . Not on file.   Social History Main Topics  . Smoking status: Former Games developermoker  . Smokeless tobacco: Never Used  . Alcohol use No  . Drug use: No  . Sexual activity: Not on file   Other Topics Concern  . Not on file   Social History Narrative  . No narrative on file    Past Surgical History:  Procedure Laterality Date  . CHOLECYSTECTOMY    . CRANIOTOMY     for AVM    Family History  Problem Relation Age of Onset  . Emphysema Father     died at age 60    Review of Systems  Constitutional: Negative.   HENT: Positive for congestion, postnasal drip and sore throat.   Eyes: Negative.   Respiratory: Positive for cough, shortness of breath and wheezing.   Cardiovascular: Negative.   Gastrointestinal: Negative.   Genitourinary: Negative.       Medication List       Accurate as of 09/07/16 11:13 AM. Always use your most recent med list.          albuterol 108 (90 Base) MCG/ACT inhaler Commonly known as:   PROVENTIL HFA;VENTOLIN HFA Inhale 2 puffs into the lungs every 6 (six) hours as needed for wheezing or shortness of breath.   albuterol 108 (90 Base) MCG/ACT inhaler Commonly known as:  PROVENTIL HFA;VENTOLIN HFA Inhale 2 puffs into the lungs 4 (four) times daily as needed.   ALPRAZolam 0.5 MG tablet Commonly known as:  XANAX Take 0.5 mg by mouth 2 (two) times daily as needed.   aspirin 81 MG tablet Take 81 mg by mouth daily.   azithromycin 250 MG tablet Commonly known as:  ZITHROMAX Z-PAK As directed   Biotin 10 MG Tabs Take by mouth.   calcium carbonate 1250 MG capsule Take 1,250 mg by mouth 2 (two) times daily with a meal.   fluticasone 50 MCG/ACT nasal spray Commonly known as:  FLONASE Place into both nostrils daily.   ibuprofen 800 MG tablet Commonly known as:  ADVIL,MOTRIN Take 800 mg by mouth 2 (two) times daily as needed.   levothyroxine 125 MCG tablet Commonly known as:  SYNTHROID, LEVOTHROID Take 125 mcg by mouth daily.   magnesium 30 MG tablet Take 30 mg by mouth 2 (two) times daily.   montelukast 10 MG tablet Commonly known as:  SINGULAIR Take 10 mg by mouth at bedtime.   multivitamin tablet Take 1 tablet by mouth daily.   omeprazole 20 MG capsule Commonly known as:  PRILOSEC Take 20 mg by mouth daily.   SAVELLA 100 MG Tabs tablet Generic drug:  Milnacipran HCl Take 100 mg by mouth 2 (two) times daily.   SUPER B COMPLEX/C PO Take 1 tablet by mouth 2 (two) times daily.   tiZANidine 4 MG tablet Commonly known as:  ZANAFLEX Take 4 mg by mouth 3 (three) times daily.   Vitamin D3 2000 units Tabs Take 1 tablet by mouth 2 (two) times daily.   zinc gluconate 50 MG tablet Take 50 mg by mouth 2 (two) times daily.          Objective:    BP 120/78   Pulse 100   Temp 97.1 F (36.2 C) (Oral)   Resp 16   Ht 5' (1.524 m)   Wt 200 lb 6.4 oz (90.9 kg)   SpO2 100%   BMI 39.14 kg/m   Allergies  Allergen Reactions  . Bee Venom   . [employer]  . Citalopram Hydrobromide   . Tramadol     Wt Readings from Last 3 Encounters:  09/07/16 200 lb 6.4 oz (90.9 kg)  08/03/16 203 lb 3.2 oz (92.2 kg)  07/10/15 198 lb (89.8 kg)    Physical Exam  Constitutional: She is oriented to person, place, and time. She appears well-developed and well-nourished.  HENT:  Head: Normocephalic and atraumatic.  Right Ear: No drainage or tenderness.  Left Ear: No drainage.  Nose: Mucosal edema and rhinorrhea present. Right sinus exhibits no maxillary sinus tenderness and no frontal sinus tenderness. Left sinus exhibits no maxillary sinus tenderness and no frontal sinus tenderness.  Mouth/Throat: Oropharyngeal exudate and posterior oropharyngeal erythema present.  Eyes: Conjunctivae and EOM are normal. Pupils are equal, round, and reactive to light.  Neck: Normal range of motion. Neck supple.  Cardiovascular: Normal rate, regular rhythm, normal heart sounds and intact distal pulses.   Pulmonary/Chest: Effort normal. She has wheezes in the right upper field and the left upper field.  Abdominal: Soft. Bowel sounds are normal.  Neurological: She is alert and oriented to person, place, and time. She has normal reflexes.  Skin: Skin is warm and dry. No rash noted.  Psychiatric: She has a normal mood and affect. Her behavior is normal. Judgment and thought content normal.        Assessment & Plan:   1. Acute bronchitis, unspecified organism Albuterol MDI or nebulizer QID Zpak # 1 take as directed Depomedrol 80 mg one injection today   Continue all other maintenance medications as listed above.  Follow up plan: PRN worsening or non resolution if condition  Educational handout given for bronchitis  Remus Loffler PA-C [facility] [address]  [place], [place] [address] [phone]   09/07/2016, 11:13 AM

## 2016-09-07 NOTE — Addendum Note (Signed)
Addended by: Tamera PuntWRAY, Yamaira Spinner S on: 09/07/2016 11:42 AM   Modules accepted: Orders

## 2016-09-16 ENCOUNTER — Ambulatory Visit (INDEPENDENT_AMBULATORY_CARE_PROVIDER_SITE_OTHER): Payer: PPO | Admitting: Physician Assistant

## 2016-09-16 ENCOUNTER — Encounter: Payer: Self-pay | Admitting: Physician Assistant

## 2016-09-16 VITALS — BP 138/75 | HR 72 | Temp 97.3°F | Resp 16 | Ht 60.0 in | Wt 197.4 lb

## 2016-09-16 DIAGNOSIS — J0100 Acute maxillary sinusitis, unspecified: Secondary | ICD-10-CM

## 2016-09-16 DIAGNOSIS — S29019A Strain of muscle and tendon of unspecified wall of thorax, initial encounter: Secondary | ICD-10-CM

## 2016-09-16 MED ORDER — AMOXICILLIN 500 MG PO CAPS: 500.0000 mg | ORAL_CAPSULE | Freq: Three times a day (TID) | ORAL | 0 refills | Status: DC

## 2016-09-16 NOTE — Progress Notes (Signed)
BP 138/75   Pulse 72   Temp 97.3 F (36.3 C) (Oral)   Resp 16   Ht 5' (1.524 m)   Wt 197 lb 6.4 oz (89.5 kg)   SpO2 100%   BMI 38.55 kg/m    Subjective:    Patient ID: [name], female    DOB: 1956/05/10, 60 y.o.   MRN: [id number]  HPI: AMMANDA DOBBINS is a 60 y.o. female presenting on 09/16/2016 for Shortness of Breath The patient finished her antibiotics for her bronchitis last week. She felt better for a few days but at this point is having significant amount of sinus drainage and pressure. She is having pain over her maxillary sinuses. She denies any fever or chills at this time. She is also having a significant amount of pain along the lower right rib area. She states that her cough is not that bad. She also has some shortness of breath with exertion. I reminded her that she did have bronchitis this past month and sometimes it can take several weeks for her to fully feel that her breathing is normal. I've encouraged her to use her inhaler first thing in the morning upon getting up. She is also use it throughout the day if needed.    Relevant past medical, surgical, family and social history reviewed and updated as indicated. Interim medical history since our last visit reviewed. Allergies and medications reviewed and updated. DATA REVIEWED: CHART IN EPIC  Social History   Social History  . Marital status: Married    Spouse name: N/A  . Number of children: N/A  . Years of education: N/A   Occupational History  . Not on file.   Social History Main Topics  . Smoking status: Former Games developer  . Smokeless tobacco: Never Used  . Alcohol use No  . Drug use: No  . Sexual activity: Not on file   Other Topics Concern  . Not on file   Social History Narrative  . No narrative on file    Past Surgical History:  Procedure Laterality Date  . CHOLECYSTECTOMY    . CRANIOTOMY     for AVM    Family History  Problem Relation Age of Onset  . Emphysema Father     died at  age 51    Review of Systems  Constitutional: Negative.  Negative for activity change, fatigue and fever.  HENT: Positive for postnasal drip, sinus pressure and sore throat.   Eyes: Negative.   Respiratory: Positive for shortness of breath. Negative for cough.   Cardiovascular: Negative.  Negative for chest pain.  Gastrointestinal: Negative.  Negative for abdominal pain.  Endocrine: Negative.   Genitourinary: Negative.  Negative for dysuria.  Musculoskeletal: Positive for back pain and neck pain.  Skin: Negative.   Neurological: Negative.       Medication List       Accurate as of 09/16/16 12:07 PM. Always use your most recent med list.          albuterol 108 (90 Base) MCG/ACT inhaler Commonly known as:  PROVENTIL HFA;VENTOLIN HFA Inhale 2 puffs into the lungs every 6 (six) hours as needed for wheezing or shortness of breath.   albuterol 108 (90 Base) MCG/ACT inhaler Commonly known as:  PROVENTIL HFA;VENTOLIN HFA Inhale 2 puffs into the lungs 4 (four) times daily as needed.   ALPRAZolam 0.5 MG tablet Commonly known as:  XANAX Take 0.5 mg by mouth 2 (two) times daily as needed.  amoxicillin 500 MG capsule Commonly known as:  AMOXIL Take 1 capsule (500 mg total) by mouth 3 (three) times daily.   aspirin 81 MG tablet Take 81 mg by mouth daily.   Biotin 10 MG Tabs Take by mouth.   calcium carbonate 1250 MG capsule Take 1,250 mg by mouth 2 (two) times daily with a meal.   fluticasone 50 MCG/ACT nasal spray Commonly known as:  FLONASE Place into both nostrils daily.   ibuprofen 800 MG tablet Commonly known as:  ADVIL,MOTRIN Take 800 mg by mouth 2 (two) times daily as needed.   levothyroxine 125 MCG tablet Commonly known as:  SYNTHROID, LEVOTHROID Take 125 mcg by mouth daily.   magnesium 30 MG tablet Take 30 mg by mouth 2 (two) times daily.   montelukast 10 MG tablet Commonly known as:  SINGULAIR Take 10 mg by mouth at bedtime.   multivitamin tablet Take  1 tablet by mouth daily.   omeprazole 20 MG capsule Commonly known as:  PRILOSEC Take 20 mg by mouth daily.   SAVELLA 100 MG Tabs tablet Generic drug:  Milnacipran HCl Take 100 mg by mouth 2 (two) times daily.   SUPER B COMPLEX/C PO Take 1 tablet by mouth 2 (two) times daily.   tiZANidine 4 MG tablet Commonly known as:  ZANAFLEX Take 4 mg by mouth 3 (three) times daily.   Vitamin D3 2000 units Tabs Take 1 tablet by mouth 2 (two) times daily.   zinc gluconate 50 MG tablet Take 50 mg by mouth 2 (two) times daily.          Objective:    BP 138/75   Pulse 72   Temp 97.3 F (36.3 C) (Oral)   Resp 16   Ht 5' (1.524 m)   Wt 197 lb 6.4 oz (89.5 kg)   SpO2 100%   BMI 38.55 kg/m   Allergies  Allergen Reactions  . Bee Venom   . [employer] Flavor Hives  . Citalopram Hydrobromide   . Tramadol     Wt Readings from Last 3 Encounters:  09/16/16 197 lb 6.4 oz (89.5 kg)  09/07/16 200 lb 6.4 oz (90.9 kg)  08/03/16 203 lb 3.2 oz (92.2 kg)    Physical Exam  Constitutional: She is oriented to person, place, and time. She appears well-developed and well-nourished.  HENT:  Head: Normocephalic and atraumatic.  Right Ear: Tympanic membrane and external ear normal. No middle ear effusion.  Left Ear: Tympanic membrane and external ear normal.  No middle ear effusion.  Nose: Mucosal edema and rhinorrhea present. Right sinus exhibits maxillary sinus tenderness. Left sinus exhibits maxillary sinus tenderness.  Mouth/Throat: Uvula is midline. Posterior oropharyngeal erythema present.  Eyes: Conjunctivae and EOM are normal. Pupils are equal, round, and reactive to light. Right eye exhibits no discharge. Left eye exhibits no discharge.  Neck: Normal range of motion.  Cardiovascular: Normal rate, regular rhythm and normal heart sounds.   Pulmonary/Chest: Effort normal and breath sounds normal. No respiratory distress. She has no wheezes.     She exhibits tenderness and bony tenderness.    Abdominal: Soft.  Lymphadenopathy:    She has no cervical adenopathy.  Neurological: She is alert and oriented to person, place, and time.  Skin: Skin is warm and dry.  Psychiatric: She has a normal mood and affect.        Assessment & Plan:   1. Acute maxillary sinusitis, recurrence not specified - amoxicillin (AMOXIL) 500 MG capsule; Take 1 capsule (  500 mg total) by mouth 3 (three) times daily.  Dispense: 30 capsule; Refill: 0  2. Thoracic myofascial strain, initial encounter Take ibuprofen 800 mg that she has at home, 1 tablet twice a day for the right sided chest wall pain. Call if she does not have improvement.   Continue all other maintenance medications as listed above.  Follow up plan: Return if symptoms worsen or fail to improve.   Educational handout given for Thoracic strain  Remus Loffler PA-C [facility] [address]  [place], [place] [phone]   09/16/2016, 12:07 PM

## 2016-09-16 NOTE — Patient Instructions (Signed)
Thoracic Strain A thoracic strain, which is sometimes called a mid-back strain, is an injury to the muscles or tendons that attach to the upper part of your back behind your chest. This type of injury occurs when a muscle is overstretched or overloaded.  Thoracic strains can range from mild to severe. Mild strains may involve stretching a muscle or tendon without tearing it. These injuries may heal in 1-2 weeks. More severe strains involve tearing of muscle fibers or tendons. These will cause more pain and may take 6-8 weeks to heal. CAUSES This condition may be caused by:  An injury in which a sudden force is placed on the muscle.  Exercising without properly warming up.  Overuse of the muscle.  Improper form during certain movements.  Other injuries that surround or cause stress on the mid-back, causing a strain on the muscles. In some cases, the cause may not be known. RISK FACTORS This injury is more common in:  Athletes.  People with obesity. SYMPTOMS The main symptom of this condition is pain, especially with movement. Other symptoms include:  Bruising.  Swelling.  Spasm. DIAGNOSIS This condition may be diagnosed with a physical exam. X-rays may be taken to check for a fracture. TREATMENT This condition may be treated with:  Resting and icing the injured area.  Physical therapy. This will involve doing stretching and strengthening exercises.  Medicines for pain and inflammation. HOME CARE INSTRUCTIONS  Rest as needed. Follow instructions from your health care provider about any restrictions on activity.  If directed, apply ice to the injured area:  Put ice in a plastic bag.  Place a towel between your skin and the bag.  Leave the ice on for 20 minutes, 2-3 times per day.  Take over-the-counter and prescription medicines only as told by your health care provider.  Begin doing exercises as told by your health care provider or physical therapist.  Always  warm up properly before physical activity or sports.  Bend your knees before you lift heavy objects.  Keep all follow-up visits as told by your health care provider. This is important. SEEK MEDICAL CARE IF:  Your pain is not helped by medicine.  Your pain, bruising, or swelling is getting worse.  You have a fever. SEEK IMMEDIATE MEDICAL CARE IF:  You have shortness of breath.  You have chest pain.  You develop numbness or weakness in your legs.  You have involuntary loss of urine (urinary incontinence).   This information is not intended to replace advice given to you by your health care provider. Make sure you discuss any questions you have with your health care provider.   Document Released: 02/20/2004 Document Revised: 08/21/2015 Document Reviewed: 01/24/2015 Elsevier Interactive Patient Education 2016 [employer]  

## 2016-10-05 ENCOUNTER — Ambulatory Visit (INDEPENDENT_AMBULATORY_CARE_PROVIDER_SITE_OTHER): Payer: PPO | Admitting: Physician Assistant

## 2016-10-05 ENCOUNTER — Encounter: Payer: Self-pay | Admitting: Physician Assistant

## 2016-10-05 VITALS — BP 138/85 | HR 72 | Temp 97.5°F | Ht 60.0 in | Wt 198.2 lb

## 2016-10-05 DIAGNOSIS — J4520 Mild intermittent asthma, uncomplicated: Secondary | ICD-10-CM | POA: Insufficient documentation

## 2016-10-05 DIAGNOSIS — M797 Fibromyalgia: Secondary | ICD-10-CM | POA: Diagnosis not present

## 2016-10-05 DIAGNOSIS — Z1159 Encounter for screening for other viral diseases: Secondary | ICD-10-CM | POA: Diagnosis not present

## 2016-10-05 DIAGNOSIS — E781 Pure hyperglyceridemia: Secondary | ICD-10-CM | POA: Insufficient documentation

## 2016-10-05 DIAGNOSIS — E039 Hypothyroidism, unspecified: Secondary | ICD-10-CM

## 2016-10-05 DIAGNOSIS — K219 Gastro-esophageal reflux disease without esophagitis: Secondary | ICD-10-CM | POA: Diagnosis not present

## 2016-10-05 NOTE — Progress Notes (Signed)
BP 138/85   Pulse 72   Temp 97.5 F (36.4 C) (Oral)   Ht 5' (1.524 m)   Wt 198 lb 3.2 oz (89.9 kg)   BMI 38.71 kg/m    Subjective:    Patient ID: Alicia Russo, female    DOB: [birth year], 60 y.o.   MRN: [id number]  HPI: Alicia Russo is a 60 y.o. female presenting on 10/05/2016 for Follow-up (labs)  Patient comes in today for recheck on her blood pressure and medications. She states she is doing well overall. She recently did have a cold and has continued with a little bit of cough. She also has a left-sided cervical neck pain with a little bit of swelling of her muscle. She states she has not had injury.  Relevant past medical, surgical, family and social history reviewed and updated as indicated. Allergies and medications reviewed and updated.  Past Medical History:  Diagnosis Date  . Asthma   . AVM (arteriovenous malformation)    Intracranial with seizures  . Fibromyalgia   . Hyperlipidemia   . Hypothyroidism     Past Surgical History:  Procedure Laterality Date  . CHOLECYSTECTOMY    . CRANIOTOMY     for AVM    Review of Systems  Constitutional: Negative.  Negative for activity change, fatigue and fever.  HENT: Negative.   Eyes: Negative.   Respiratory: Positive for cough. Negative for shortness of breath and wheezing.   Cardiovascular: Negative.  Negative for chest pain.  Gastrointestinal: Negative.  Negative for abdominal pain.  Endocrine: Negative.   Genitourinary: Negative.  Negative for dysuria.  Musculoskeletal: Positive for neck pain and neck stiffness.  Skin: Negative.   Neurological: Negative.       Medication List       Accurate as of 10/05/16 11:08 AM. Always use your most recent med list.          albuterol 108 (90 Base) MCG/ACT inhaler Commonly known as:  PROVENTIL HFA;VENTOLIN HFA Inhale 2 puffs into the lungs every 6 (six) hours as needed for wheezing or shortness of breath.   albuterol 108 (90 Base) MCG/ACT inhaler Commonly known  as:  PROVENTIL HFA;VENTOLIN HFA Inhale 2 puffs into the lungs 4 (four) times daily as needed.   ALPRAZolam 0.5 MG tablet Commonly known as:  XANAX Take 0.5 mg by mouth 2 (two) times daily as needed.   aspirin 81 MG tablet Take 81 mg by mouth daily.   Biotin 10 MG Tabs Take by mouth.   calcium carbonate 1250 MG capsule Take 1,250 mg by mouth 2 (two) times daily with a meal.   fluticasone 50 MCG/ACT nasal spray Commonly known as:  FLONASE Place into both nostrils daily.   ibuprofen 800 MG tablet Commonly known as:  ADVIL,MOTRIN Take 800 mg by mouth 2 (two) times daily as needed.   levothyroxine 125 MCG tablet Commonly known as:  SYNTHROID, LEVOTHROID Take 125 mcg by mouth daily.   magnesium 30 MG tablet Take 30 mg by mouth 2 (two) times daily.   montelukast 10 MG tablet Commonly known as:  SINGULAIR Take 10 mg by mouth at bedtime.   multivitamin tablet Take 1 tablet by mouth daily.   omeprazole 20 MG capsule Commonly known as:  PRILOSEC Take 20 mg by mouth daily.   SAVELLA 100 MG Tabs tablet Generic drug:  Milnacipran HCl Take 100 mg by mouth 2 (two) times daily.   SUPER B COMPLEX/C PO Take 1 tablet by mouth 2 (  two) times daily.   tiZANidine 4 MG tablet Commonly known as:  ZANAFLEX Take 4 mg by mouth 3 (three) times daily.   Vitamin D3 2000 units Tabs Take 1 tablet by mouth 2 (two) times daily.   zinc gluconate 50 MG tablet Take 50 mg by mouth 2 (two) times daily.          Objective:    BP 138/85   Pulse 72   Temp 97.5 F (36.4 C) (Oral)   Ht 5' (1.524 m)   Wt 198 lb 3.2 oz (89.9 kg)   BMI 38.71 kg/m   Allergies  Allergen Reactions  . Bee Venom   . [employer]  . Citalopram Hydrobromide   . Tramadol     Physical Exam  Constitutional: She is oriented to person, place, and time. She appears well-developed and well-nourished.  HENT:  Head: Normocephalic and atraumatic.  Eyes: Conjunctivae and EOM are normal. Pupils are equal,  round, and reactive to light.  Cardiovascular: Normal rate, regular rhythm, normal heart sounds and intact distal pulses.   Pulmonary/Chest: Effort normal and breath sounds normal.  Abdominal: Soft. Bowel sounds are normal.  Musculoskeletal:       Cervical back: She exhibits decreased range of motion, swelling, pain and spasm.       Back:  Neurological: She is alert and oriented to person, place, and time. She has normal reflexes.  Skin: Skin is warm and dry. No rash noted.  Psychiatric: She has a normal mood and affect. Her behavior is normal. Judgment and thought content normal.  Nursing note and vitals reviewed.       Assessment & Plan:   1. Hypothyroidism, unspecified type - CMP14+EGFR - TSH  2. Pure hyperglyceridemia - CBC with Differential/Platelet - CMP14+EGFR - Lipid panel  3. Mild intermittent asthma, unspecified whether complicated  4. Fibromyalgia  5. Gastroesophageal reflux disease without esophagitis - CBC with Differential/Platelet  6. Need for hepatitis C screening test - Hepatitis C antibody   Continue all other maintenance medications as listed above.  Follow up plan: Return in about 3 months (around 01/05/2017).  Orders Placed This Encounter  Procedures  . CBC with Differential/Platelet  . CMP14+EGFR  . Lipid panel  . TSH  . Hepatitis C antibody    Educational handout given for Bethany PA-C [facility] [address]  [place], [place] [address] [phone]   10/05/2016, 11:08 AM

## 2016-10-05 NOTE — Patient Instructions (Signed)
DASH Eating Plan  DASH stands for "Dietary Approaches to Stop Hypertension." The DASH eating plan is a healthy eating plan that has been shown to reduce high blood pressure (hypertension). Additional health benefits may include reducing the risk of type 2 diabetes mellitus, heart disease, and stroke. The DASH eating plan may also help with weight loss.  WHAT DO I NEED TO KNOW ABOUT THE DASH EATING PLAN?  For the DASH eating plan, you will follow these general guidelines:  · Choose foods with a percent daily value for sodium of less than 5% (as listed on the food label).  · Use salt-free seasonings or herbs instead of table salt or sea salt.  · Check with your health care provider or pharmacist before using salt substitutes.  · Eat lower-sodium products, often labeled as "lower sodium" or "no salt added."  · Eat fresh foods.  · Eat more vegetables, fruits, and low-fat dairy products.  · Choose whole grains. Look for the word "whole" as the first word in the ingredient list.  · Choose fish and skinless chicken or turkey more often than red meat. Limit fish, poultry, and meat to 6 oz (170 g) each day.  · Limit sweets, desserts, sugars, and sugary drinks.  · Choose heart-healthy fats.  · Limit cheese to 1 oz (28 g) per day.  · Eat more home-cooked food and less restaurant, buffet, and fast food.  · Limit fried foods.  · Cook foods using methods other than frying.  · Limit canned vegetables. If you do use them, rinse them well to decrease the sodium.  · When eating at a restaurant, ask that your food be prepared with less salt, or no salt if possible.  WHAT FOODS CAN I EAT?  Seek help from a dietitian for individual calorie needs.  Grains  Whole grain or whole wheat bread. Brown rice. Whole grain or whole wheat pasta. Quinoa, bulgur, and whole grain cereals. Low-sodium cereals. Corn or whole wheat flour tortillas. Whole grain cornbread. Whole grain crackers. Low-sodium crackers.  Vegetables  Fresh or frozen vegetables  (raw, steamed, roasted, or grilled). Low-sodium or reduced-sodium tomato and vegetable juices. Low-sodium or reduced-sodium tomato sauce and paste. Low-sodium or reduced-sodium canned vegetables.   Fruits  All fresh, canned (in natural juice), or frozen fruits.  Meat and Other Protein Products  Ground beef (85% or leaner), grass-fed beef, or beef trimmed of fat. Skinless chicken or turkey. Ground chicken or turkey. Pork trimmed of fat. All fish and seafood. Eggs. Dried beans, peas, or lentils. Unsalted nuts and seeds. Unsalted canned beans.  Dairy  Low-fat dairy products, such as skim or 1% milk, 2% or reduced-fat cheeses, low-fat ricotta or cottage cheese, or plain low-fat yogurt. Low-sodium or reduced-sodium cheeses.  Fats and Oils  Tub margarines without trans fats. Light or reduced-fat mayonnaise and salad dressings (reduced sodium). Avocado. Safflower, olive, or canola oils. Natural peanut or almond butter.  Other  Unsalted popcorn and pretzels.  The items listed above may not be a complete list of recommended foods or beverages. Contact your dietitian for more options.  WHAT FOODS ARE NOT RECOMMENDED?  Grains  White bread. White pasta. White rice. Refined cornbread. Bagels and croissants. Crackers that contain trans fat.  Vegetables  Creamed or fried vegetables. Vegetables in a cheese sauce. Regular canned vegetables. Regular canned tomato sauce and paste. Regular tomato and vegetable juices.  Fruits  Dried fruits. Canned fruit in light or heavy syrup. Fruit juice.  Meat and Other Protein   Products  Fatty cuts of meat. Ribs, chicken wings, bacon, sausage, bologna, salami, chitterlings, fatback, hot dogs, bratwurst, and packaged luncheon meats. Salted nuts and seeds. Canned beans with salt.  Dairy  Whole or 2% milk, cream, half-and-half, and cream cheese. Whole-fat or sweetened yogurt. Full-fat cheeses or blue cheese. Nondairy creamers and whipped toppings. Processed cheese, cheese spreads, or cheese  curds.  Condiments  Onion and garlic salt, seasoned salt, table salt, and sea salt. Canned and packaged gravies. Worcestershire sauce. Tartar sauce. Barbecue sauce. Teriyaki sauce. Soy sauce, including reduced sodium. Steak sauce. Fish sauce. Oyster sauce. Cocktail sauce. Horseradish. Ketchup and mustard. Meat flavorings and tenderizers. Bouillon cubes. Hot sauce. Tabasco sauce. Marinades. Taco seasonings. Relishes.  Fats and Oils  Butter, stick margarine, lard, shortening, ghee, and bacon fat. Coconut, palm kernel, or palm oils. Regular salad dressings.  Other  Pickles and olives. Salted popcorn and pretzels.  The items listed above may not be a complete list of foods and beverages to avoid. Contact your dietitian for more information.  WHERE CAN I FIND MORE INFORMATION?  National Heart, Lung, and Blood Institute: www.nhlbi.nih.gov/health/health-topics/topics/dash/     This information is not intended to replace advice given to you by your health care provider. Make sure you discuss any questions you have with your health care provider.     Document Released: 11/19/2011 Document Revised: 12/21/2014 Document Reviewed: 10/04/2013  Elsevier Interactive Patient Education ©2016 Elsevier Inc.

## 2016-10-06 LAB — CMP14+EGFR
A/G RATIO: 1.9 (ref 1.2–2.2)
ALT: 23 IU/L (ref 0–32)
AST: 18 IU/L (ref 0–40)
Albumin: 4 g/dL (ref 3.6–4.8)
Alkaline Phosphatase: 107 IU/L (ref 39–117)
BUN/Creatinine Ratio: 9 — ABNORMAL LOW (ref 12–28)
BUN: 6 mg/dL — AB (ref 8–27)
Bilirubin Total: 0.2 mg/dL (ref 0.0–1.2)
CALCIUM: 9.2 mg/dL (ref 8.7–10.3)
CHLORIDE: 100 mmol/L (ref 96–106)
CO2: 28 mmol/L (ref 18–29)
Creatinine, Ser: 0.66 mg/dL (ref 0.57–1.00)
GFR calc Af Amer: 111 mL/min/{1.73_m2} (ref >59)
GFR, EST NON AFRICAN AMERICAN: 96 mL/min/{1.73_m2} (ref >59)
GLUCOSE: 87 mg/dL (ref 65–99)
Globulin, Total: 2.1 g/dL (ref 1.5–4.5)
POTASSIUM: 4.8 mmol/L (ref 3.5–5.2)
Sodium: 144 mmol/L (ref 134–144)
Total Protein: 6.1 g/dL (ref 6.0–8.5)

## 2016-10-06 LAB — CBC WITH DIFFERENTIAL/PLATELET
BASOS: 1 %
Basophils Absolute: 0.1 10*3/uL (ref 0.0–0.2)
EOS (ABSOLUTE): 0.3 10*3/uL (ref 0.0–0.4)
EOS: 3 %
HEMATOCRIT: 44.6 % (ref 34.0–46.6)
Hemoglobin: 14.4 g/dL (ref 11.1–15.9)
Immature Grans (Abs): 0.1 10*3/uL (ref 0.0–0.1)
Immature Granulocytes: 1 %
LYMPHS ABS: 2.4 10*3/uL (ref 0.7–3.1)
Lymphs: 25 %
MCH: 29.4 pg (ref 26.6–33.0)
MCHC: 32.3 g/dL (ref 31.5–35.7)
MCV: 91 fL (ref 79–97)
MONOS ABS: 0.7 10*3/uL (ref 0.1–0.9)
Monocytes: 7 %
NEUTROS PCT: 63 %
Neutrophils Absolute: 6.2 10*3/uL (ref 1.4–7.0)
PLATELETS: 332 10*3/uL (ref 150–379)
RBC: 4.89 x10E6/uL (ref 3.77–5.28)
RDW: 14.8 % (ref 12.3–15.4)
WBC: 9.8 10*3/uL (ref 3.4–10.8)

## 2016-10-06 LAB — LIPID PANEL
CHOL/HDL RATIO: 3.2 ratio (ref 0.0–4.4)
Cholesterol, Total: 193 mg/dL (ref 100–199)
HDL: 60 mg/dL (ref >39)
LDL Calculated: 107 mg/dL — ABNORMAL HIGH (ref 0–99)
TRIGLYCERIDES: 130 mg/dL (ref 0–149)
VLDL CHOLESTEROL CAL: 26 mg/dL (ref 5–40)

## 2016-10-06 LAB — TSH: TSH: 0.971 u[IU]/mL (ref 0.450–4.500)

## 2016-10-06 LAB — HEPATITIS C ANTIBODY: Hep C Virus Ab: <0.1 s/co ratio (ref 0.0–0.9)

## 2016-11-03 ENCOUNTER — Other Ambulatory Visit: Payer: Self-pay | Admitting: *Deleted

## 2016-11-03 MED ORDER — ALPRAZOLAM 0.5 MG PO TABS: 0.5000 mg | ORAL_TABLET | Freq: Two times a day (BID) | ORAL | 5 refills | Status: DC | PRN

## 2016-11-03 NOTE — Telephone Encounter (Signed)
Pt is requesting refill on her xanax, if approved please route to Nurse Pool A for nurse to call in.

## 2016-11-30 ENCOUNTER — Ambulatory Visit (INDEPENDENT_AMBULATORY_CARE_PROVIDER_SITE_OTHER): Payer: PPO | Admitting: Family Medicine

## 2016-11-30 ENCOUNTER — Encounter: Payer: Self-pay | Admitting: Family Medicine

## 2016-11-30 VITALS — BP 136/87 | HR 77 | Temp 98.2°F | Ht 60.0 in | Wt 202.8 lb

## 2016-11-30 DIAGNOSIS — J4531 Mild persistent asthma with (acute) exacerbation: Secondary | ICD-10-CM

## 2016-11-30 MED ORDER — AZITHROMYCIN 250 MG PO TABS: ORAL_TABLET | ORAL | 0 refills | Status: DC

## 2016-11-30 MED ORDER — METHYLPREDNISOLONE ACETATE 80 MG/ML IJ SUSP
80.0000 mg | Freq: Once | INTRAMUSCULAR | Status: AC
  Administered 2016-11-30: 80 mg via INTRAMUSCULAR

## 2016-11-30 NOTE — Progress Notes (Signed)
BP (!) 154/97   Pulse 82   Temp 98.2 F (36.8 C) (Oral)   Ht 5' (1.524 m)   Wt 202 lb 12.8 oz (92 kg)   BMI 39.61 kg/m    Subjective:    Patient ID: Alicia Russo, female    DOB: 07/06/1956, 60 y.o.   MRN: [id number]  HPI: Alicia Russo is a 60 y.o. female presenting on 11/30/2016 for URI (has been taking her inhalers & breathing txs, since Saturday has gotten worse since, no known fever) and Hoarse   HPI Cough and chest congestion and wheezing Patient comes in today complaining of cough and chest congestion and wheezing is been going on for the past 3 days. She denies any fevers or chills that she knows of. She has been having more wheezing and having to use her albuterol inhaler up to 3 or 4 times a day. She is also been using her nebulizer machine a couple times a day as well. She denies any sick contacts that she knows of. She says she usually gets like this a few times a year when the weather changes. She has been having some mild shortness of breath as well.  Relevant past medical, surgical, family and social history reviewed and updated as indicated. Interim medical history since our last visit reviewed. Allergies and medications reviewed and updated.  Review of Systems  Constitutional: Negative for chills and fever.  HENT: Positive for congestion, postnasal drip, rhinorrhea, sinus pressure, sneezing and sore throat. Negative for ear discharge and ear pain.   Eyes: Negative for pain, redness and visual disturbance.  Respiratory: Positive for cough, shortness of breath and wheezing. Negative for chest tightness.   Cardiovascular: Negative for chest pain and leg swelling.  Genitourinary: Negative for difficulty urinating and dysuria.  Musculoskeletal: Negative for back pain and gait problem.  Skin: Negative for rash.  Neurological: Negative for light-headedness and headaches.  Psychiatric/Behavioral: Negative for agitation and behavioral problems.  All other systems  reviewed and are negative.   Per HPI unless specifically indicated above      Objective:    BP (!) 154/97   Pulse 82   Temp 98.2 F (36.8 C) (Oral)   Ht 5' (1.524 m)   Wt 202 lb 12.8 oz (92 kg)   BMI 39.61 kg/m   Wt Readings from Last 3 Encounters:  11/30/16 202 lb 12.8 oz (92 kg)  10/05/16 198 lb 3.2 oz (89.9 kg)  09/16/16 197 lb 6.4 oz (89.5 kg)    Physical Exam  Constitutional: She is oriented to person, place, and time. She appears well-developed and well-nourished. No distress.  HENT:  Right Ear: Tympanic membrane, external ear and ear canal normal.  Left Ear: Tympanic membrane, external ear and ear canal normal.  Nose: Mucosal edema and rhinorrhea present. No epistaxis. Right sinus exhibits no maxillary sinus tenderness and no frontal sinus tenderness. Left sinus exhibits no maxillary sinus tenderness and no frontal sinus tenderness.  Mouth/Throat: Uvula is midline and mucous membranes are normal. Posterior oropharyngeal edema and posterior oropharyngeal erythema present. No oropharyngeal exudate or tonsillar abscesses.  Eyes: Conjunctivae are normal.  Cardiovascular: Normal rate, regular rhythm, normal heart sounds and intact distal pulses.   No murmur heard. Pulmonary/Chest: Effort normal and breath sounds normal. No respiratory distress. She has no wheezes (No wheezes on exam today.). She has no rales.  Musculoskeletal: Normal range of motion. She exhibits no edema or tenderness.  Neurological: She is alert and oriented to person,  place, and time. Coordination normal.  Skin: Skin is warm and dry. No rash noted. She is not diaphoretic.  Psychiatric: She has a normal mood and affect. Her behavior is normal.  Vitals reviewed.     Assessment & Plan:   Problem List Items Addressed This Visit      Respiratory   Mild intermittent asthma - Primary   Relevant Medications   methylPREDNISolone acetate (DEPO-MEDROL) injection 80 mg   azithromycin (ZITHROMAX) 250 MG tablet        Follow up plan: Return if symptoms worsen or fail to improve.  Counseling provided for all of the vaccine components No orders of the defined types were placed in this encounter.   Arville CareJoshua Zenobia Kuennen, MD [facility] 11/30/2016, 6:13 PM

## 2017-01-05 ENCOUNTER — Ambulatory Visit (INDEPENDENT_AMBULATORY_CARE_PROVIDER_SITE_OTHER): Payer: PPO | Admitting: Physician Assistant

## 2017-01-05 ENCOUNTER — Encounter: Payer: Self-pay | Admitting: Physician Assistant

## 2017-01-05 VITALS — BP 122/83 | HR 74 | Temp 97.6°F | Ht 60.0 in | Wt 198.2 lb

## 2017-01-05 DIAGNOSIS — E039 Hypothyroidism, unspecified: Secondary | ICD-10-CM | POA: Diagnosis not present

## 2017-01-05 DIAGNOSIS — Z8639 Personal history of other endocrine, nutritional and metabolic disease: Secondary | ICD-10-CM | POA: Diagnosis not present

## 2017-01-05 DIAGNOSIS — K219 Gastro-esophageal reflux disease without esophagitis: Secondary | ICD-10-CM | POA: Diagnosis not present

## 2017-01-05 DIAGNOSIS — J4520 Mild intermittent asthma, uncomplicated: Secondary | ICD-10-CM | POA: Diagnosis not present

## 2017-01-05 DIAGNOSIS — M79671 Pain in right foot: Secondary | ICD-10-CM

## 2017-01-05 NOTE — Progress Notes (Signed)
BP 122/83   Pulse 74   Temp 97.6 F (36.4 C) (Oral)   Ht 5' (1.524 m)   Wt 198 lb 3.2 oz (89.9 kg)   BMI 38.71 kg/m    Subjective:    Patient ID: Alicia Russo, female    DOB: [birth year], 61 y.o.   MRN: [id number]  HPI: NIL Alicia Russo is a 61 y.o. female presenting on 01/05/2017 for Hypothyroidism and Follow-up (3 month follow up )  This patient comes in for periodic recheck on medications and conditions. All medications are reviewed today. There are no reports of any problems with the medications. All of the medical conditions are reviewed and updated.  Lab work is reviewed and will be ordered as medically necessary. There are no new problems reported with today's visit. Right foot With tenderness in the forefoot. It is worse after she has been sitting with her foot tucked under her. She is trying to stop doing this. She does have ibuprofen at home to take. I'm to have her do that for 1 week to let us know if it does not get better.  Past Medical History:  Diagnosis Date  . Asthma   . AVM (arteriovenous malformation)    Intracranial with seizures  . Fibromyalgia   . Hyperlipidemia   . Hypothyroidism    Relevant past medical, surgical, family and social history reviewed and updated as indicated. Interim medical history since our last visit reviewed. Allergies and medications reviewed and updated. DATA REVIEWED: CHART IN EPIC  Social History   Social History  . Marital status: Married    Spouse name: N/A  . Number of children: N/A  . Years of education: N/A   Occupational History  . Not on file.   Social History Main Topics  . Smoking status: Former Games developer  . Smokeless tobacco: Never Used  . Alcohol use No  . Drug use: No  . Sexual activity: Not on file   Other Topics Concern  . Not on file   Social History Narrative  . No narrative on file    Past Surgical History:  Procedure Laterality Date  . CHOLECYSTECTOMY    . CRANIOTOMY     for AVM    Family  History  Problem Relation Age of Onset  . Emphysema Father     died at age 59    Review of Systems  Constitutional: Negative.  Negative for activity change, fatigue and fever.  HENT: Negative.   Eyes: Negative.   Respiratory: Negative.  Negative for cough.   Cardiovascular: Negative.  Negative for chest pain.  Gastrointestinal: Negative.  Negative for abdominal pain.  Endocrine: Negative.   Genitourinary: Negative.  Negative for dysuria.  Musculoskeletal: Negative.   Skin: Negative.   Neurological: Negative.     Allergies as of 01/05/2017      Reactions   Bee Venom    Cherry Flavor Hives   Citalopram Hydrobromide    Tramadol       Medication List       Accurate as of 01/05/17  2:27 PM. Always use your most recent med list.          albuterol 108 (90 Base) MCG/ACT inhaler Commonly known as:  PROVENTIL HFA;VENTOLIN HFA Inhale 2 puffs into the lungs every 6 (six) hours as needed for wheezing or shortness of breath.   albuterol 108 (90 Base) MCG/ACT inhaler Commonly known as:  PROVENTIL HFA;VENTOLIN HFA Inhale 2 puffs into the lungs 4 (four) times daily  as needed.   ALPRAZolam 0.5 MG tablet Commonly known as:  XANAX Take 1 tablet (0.5 mg total) by mouth 2 (two) times daily as needed.   aspirin 81 MG tablet Take 81 mg by mouth daily.   Biotin 10 MG Tabs Take by mouth.   calcium carbonate 1250 MG capsule Take 1,250 mg by mouth 2 (two) times daily with a meal.   fluticasone 50 MCG/ACT nasal spray Commonly known as:  FLONASE Place into both nostrils daily.   ibuprofen 800 MG tablet Commonly known as:  ADVIL,MOTRIN Take 800 mg by mouth 2 (two) times daily as needed.   levothyroxine 125 MCG tablet Commonly known as:  SYNTHROID, LEVOTHROID Take 125 mcg by mouth daily.   magnesium 30 MG tablet Take 30 mg by mouth 2 (two) times daily.   montelukast 10 MG tablet Commonly known as:  SINGULAIR Take 10 mg by mouth at bedtime.   multivitamin tablet Take 1  tablet by mouth daily.   omeprazole 20 MG capsule Commonly known as:  PRILOSEC Take 20 mg by mouth daily.   SAVELLA 100 MG Tabs tablet Generic drug:  Milnacipran HCl Take 100 mg by mouth 2 (two) times daily.   SUPER B COMPLEX/C PO Take 1 tablet by mouth 2 (two) times daily.   tiZANidine 4 MG tablet Commonly known as:  ZANAFLEX Take 4 mg by mouth 3 (three) times daily.   Vitamin D3 2000 units Tabs Take 1 tablet by mouth 2 (two) times daily.   zinc gluconate 50 MG tablet Take 50 mg by mouth 2 (two) times daily.          Objective:    BP 122/83   Pulse 74   Temp 97.6 F (36.4 C) (Oral)   Ht 5' (1.524 m)   Wt 198 lb 3.2 oz (89.9 kg)   BMI 38.71 kg/m   Allergies  Allergen Reactions  . Bee Venom   . [employer]ationCherry Flavor Hives  . Citalopram Hydrobromide   . Tramadol     Wt Readings from Last 3 Encounters:  01/05/17 198 lb 3.2 oz (89.9 kg)  11/30/16 202 lb 12.8 oz (92 kg)  10/05/16 198 lb 3.2 oz (89.9 kg)    Physical Exam  Constitutional: She is oriented to person, place, and time. She appears well-developed and well-nourished.  HENT:  Head: Normocephalic and atraumatic.  Eyes: Conjunctivae and EOM are normal. Pupils are equal, round, and reactive to light.  Cardiovascular: Normal rate, regular rhythm, normal heart sounds and intact distal pulses.   Pulmonary/Chest: Effort normal and breath sounds normal.  Abdominal: Soft. Bowel sounds are normal.  Musculoskeletal:       Right foot: There is tenderness and swelling.       Feet:  Neurological: She is alert and oriented to person, place, and time. She has normal reflexes.  Skin: Skin is warm and dry. No rash noted.  Psychiatric: She has a normal mood and affect. Her behavior is normal. Judgment and thought content normal.        Assessment & Plan:   1. Mild intermittent asthma, unspecified whether complicated  2. Gastroesophageal reflux disease without esophagitis  3. Hypothyroidism, unspecified type  4.  History of hyperlipidemia  5. Right foot pain Take ibuprofen for one week, rest, ice   Continue all other maintenance medications as listed above.  Follow up plan: Return in about 3 months (around 04/05/2017) for recheck.  No orders of the defined types were placed in this encounter.  Educational handout given for foot pain  Remus Loffler PA-C [facility] [address]  [place], [place] [address] [phone]   01/05/2017, 2:27 PM

## 2017-01-05 NOTE — Patient Instructions (Signed)
Foot Pain Introduction Many things can cause foot pain. Some common causes are:  An injury.  A sprain.  Arthritis.  Blisters.  Bunions. Follow these instructions at home: Pay attention to any changes in your symptoms. Take these actions to help with your discomfort:  If directed, put ice on the affected area:  Put ice in a plastic bag.  Place a towel between your skin and the bag.  Leave the ice on for 15-20 minutes, 3?4 times a day for 2 days.  Take over-the-counter and prescription medicines only as told by your health care provider.  Wear comfortable, supportive shoes that fit you well. Do not wear high heels.  Do not stand or walk for long periods of time.  Do not lift a lot of weight. This can put added pressure on your feet.  Do stretches to relieve foot pain and stiffness as told by your health care provider.  Rub your foot gently.  Keep your feet clean and dry. Contact a health care provider if:  Your pain does not get better after a few days of self-care.  Your pain gets worse.  You cannot stand on your foot. Get help right away if:  Your foot is numb or tingling.  Your foot or toes are swollen.  Your foot or toes turn white or blue.  You have warmth and redness along your foot. This information is not intended to replace advice given to you by your health care provider. Make sure you discuss any questions you have with your health care provider. Document Released: 12/27/2015 Document Revised: 05/07/2016 Document Reviewed: 12/26/2014  2017 Elsevier  

## 2017-01-13 ENCOUNTER — Other Ambulatory Visit: Payer: Self-pay | Admitting: Physician Assistant

## 2017-02-02 ENCOUNTER — Other Ambulatory Visit: Payer: Self-pay | Admitting: Physician Assistant

## 2017-02-12 ENCOUNTER — Emergency Department (HOSPITAL_COMMUNITY): Payer: PPO

## 2017-02-12 ENCOUNTER — Emergency Department (HOSPITAL_COMMUNITY)
Admission: EM | Admit: 2017-02-12 | Discharge: 2017-02-12 | Disposition: A | Discharge status: Home / Self Care | Payer: PPO | Attending: Emergency Medicine | Admitting: Emergency Medicine

## 2017-02-12 ENCOUNTER — Encounter (HOSPITAL_COMMUNITY): Payer: Self-pay | Admitting: Emergency Medicine

## 2017-02-12 DIAGNOSIS — Y92007 Garden or yard of unspecified non-institutional (private) residence as the place of occurrence of the external cause: Secondary | ICD-10-CM | POA: Insufficient documentation

## 2017-02-12 DIAGNOSIS — Y999 Unspecified external cause status: Secondary | ICD-10-CM | POA: Diagnosis not present

## 2017-02-12 DIAGNOSIS — S6992XA Unspecified injury of left wrist, hand and finger(s), initial encounter: Secondary | ICD-10-CM | POA: Diagnosis not present

## 2017-02-12 DIAGNOSIS — Z79899 Other long term (current) drug therapy: Secondary | ICD-10-CM | POA: Insufficient documentation

## 2017-02-12 DIAGNOSIS — Y9389 Activity, other specified: Secondary | ICD-10-CM | POA: Insufficient documentation

## 2017-02-12 DIAGNOSIS — E039 Hypothyroidism, unspecified: Secondary | ICD-10-CM | POA: Insufficient documentation

## 2017-02-12 DIAGNOSIS — Z87891 Personal history of nicotine dependence: Secondary | ICD-10-CM | POA: Insufficient documentation

## 2017-02-12 DIAGNOSIS — W1839XA Other fall on same level, initial encounter: Secondary | ICD-10-CM | POA: Insufficient documentation

## 2017-02-12 DIAGNOSIS — T148XXA Other injury of unspecified body region, initial encounter: Secondary | ICD-10-CM | POA: Diagnosis not present

## 2017-02-12 DIAGNOSIS — S52502A Unspecified fracture of the lower end of left radius, initial encounter for closed fracture: Secondary | ICD-10-CM | POA: Diagnosis not present

## 2017-02-12 DIAGNOSIS — S52592A Other fractures of lower end of left radius, initial encounter for closed fracture: Secondary | ICD-10-CM | POA: Diagnosis not present

## 2017-02-12 DIAGNOSIS — M7989 Other specified soft tissue disorders: Secondary | ICD-10-CM | POA: Diagnosis not present

## 2017-02-12 DIAGNOSIS — S52202A Unspecified fracture of shaft of left ulna, initial encounter for closed fracture: Secondary | ICD-10-CM | POA: Insufficient documentation

## 2017-02-12 DIAGNOSIS — J45909 Unspecified asthma, uncomplicated: Secondary | ICD-10-CM | POA: Diagnosis not present

## 2017-02-12 DIAGNOSIS — M79602 Pain in left arm: Secondary | ICD-10-CM | POA: Diagnosis not present

## 2017-02-12 DIAGNOSIS — S62102A Fracture of unspecified carpal bone, left wrist, initial encounter for closed fracture: Secondary | ICD-10-CM

## 2017-02-12 DIAGNOSIS — Z7982 Long term (current) use of aspirin: Secondary | ICD-10-CM | POA: Diagnosis not present

## 2017-02-12 DIAGNOSIS — Q7192 Unspecified reduction defect of left upper limb: Secondary | ICD-10-CM

## 2017-02-12 LAB — CBG MONITORING, ED: Glucose-Capillary: 114 mg/dL — ABNORMAL HIGH (ref 65–99)

## 2017-02-12 MED ORDER — HYDROMORPHONE HCL 1 MG/ML IJ SOLN
1.0000 mg | Freq: Once | INTRAMUSCULAR | Status: AC
  Administered 2017-02-12: 1 mg via INTRAVENOUS
  Filled 2017-02-12: qty 1

## 2017-02-12 MED ORDER — HYDROCODONE-ACETAMINOPHEN 5-325 MG PO TABS: 1.0000 | ORAL_TABLET | Freq: Four times a day (QID) | ORAL | 0 refills | Status: DC | PRN

## 2017-02-12 MED ORDER — LIDOCAINE HCL (PF) 2 % IJ SOLN
INTRAMUSCULAR | Status: AC
  Administered 2017-02-12: 10 mL
  Filled 2017-02-12: qty 10

## 2017-02-12 MED ORDER — ONDANSETRON HCL 4 MG/2ML IJ SOLN
4.0000 mg | Freq: Once | INTRAMUSCULAR | Status: AC
Start: 2017-02-12 — End: 2017-02-12
  Administered 2017-02-12: 4 mg via INTRAVENOUS
  Filled 2017-02-12: qty 2

## 2017-02-12 NOTE — Discharge Instructions (Signed)
Follow-up with Dr. Eulah PontMurphy at 8:30 Monday morning. Keep arm elevated.

## 2017-02-12 NOTE — ED Provider Notes (Signed)
[facility]-EMERGENCY DEPT Provider Note   CSN: [id number] Arrival date & time: 02/12/17  1213   By signing my name below, I, Alicia Russo, attest that this documentation has been prepared under the direction and in the presence of Bethann Berkshire, MD. Electronically Signed: Bobbie Russo, Scribe. 02/12/17. 12:47 PM. History   Chief Complaint Chief Complaint  Patient presents with  . Fall    wrist deformity   HPI Comments: Alicia Russo is a 61 y.o. female brought in by ambulance, who presents to the Emergency Department complaining of left wrist pain s/p fall that occurred prior to arrival. She rates the pain 10/10. The patient states that she was picking up things out of her yard when the wind blew her down. She reports falling and landing on her left wrist. She denies LOC and any other injuries. She has not taken anything for her pain.  The history is provided by the patient. No language interpreter was used.  Fall  This is a new problem. The current episode started 1 to 2 hours ago. The problem occurs constantly. The problem has been gradually worsening. Pertinent negatives include no chest pain, no abdominal pain and no headaches. She has tried nothing for the symptoms.  Arm Injury   This is a new problem. The current episode started 1 to 2 hours ago. The pain is present in the left wrist. The quality of the pain is described as aching. The pain is at a severity of 10/10. The pain is severe. The symptoms are aggravated by contact. She has tried nothing for the symptoms.    Past Medical History:  Diagnosis Date  . Asthma   . AVM (arteriovenous malformation)    Intracranial with seizures  . Fibromyalgia   . Hyperlipidemia   . Hypothyroidism     Patient Active Problem List   Diagnosis Date Noted  . History of hyperlipidemia 01/05/2017  . Pure hyperglyceridemia 10/05/2016  . Mild intermittent asthma 10/05/2016  . Fibromyalgia 10/05/2016  . Gastroesophageal reflux disease  without esophagitis 10/05/2016  . Dog bite of left calf 08/03/2016  . Cellulitis of left leg 08/03/2016  . Hypothyroidism 02/06/2011  . PALPITATIONS, RECURRENT 01/06/2011  . DYSPNEA 01/06/2011    Past Surgical History:  Procedure Laterality Date  . CHOLECYSTECTOMY    . CRANIOTOMY     for AVM    OB History    No data available       Home Medications    Prior to Admission medications   Medication Sig Start Date End Date Taking? Authorizing Provider  albuterol (PROVENTIL HFA;VENTOLIN HFA) 108 (90 BASE) MCG/ACT inhaler Inhale 2 puffs into the lungs 4 (four) times daily as needed.    Historical Provider, MD  albuterol (PROVENTIL HFA;VENTOLIN HFA) 108 (90 Base) MCG/ACT inhaler Inhale 2 puffs into the lungs every 6 (six) hours as needed for wheezing or shortness of breath. 09/07/16   Remus Loffler, PA-C  ALPRAZolam Prudy Feeler) 0.5 MG tablet Take 1 tablet (0.5 mg total) by mouth 2 (two) times daily as needed. 11/03/16   Remus Loffler, PA-C  aspirin 81 MG tablet Take 81 mg by mouth daily.    Historical Provider, MD  Biotin 10 MG TABS Take by mouth.    Historical Provider, MD  calcium carbonate 1250 MG capsule Take 1,250 mg by mouth 2 (two) times daily with a meal.    Historical Provider, MD  Cholecalciferol (VITAMIN D3) 2000 UNITS TABS Take 1 tablet by mouth 2 (two) times daily.  Historical Provider, MD  fluticasone (FLONASE) 50 MCG/ACT nasal spray Place into both nostrils daily.    Historical Provider, MD  ibuprofen (ADVIL,MOTRIN) 800 MG tablet TAKE 1 TABLET BY MOUTH THREE TIMES DAILY 02/03/17   Remus LofflerAngel S Jones, PA-C  levothyroxine (SYNTHROID, LEVOTHROID) 125 MCG tablet Take 125 mcg by mouth daily.    Historical Provider, MD  magnesium 30 MG tablet Take 30 mg by mouth 2 (two) times daily.    Historical Provider, MD  montelukast (SINGULAIR) 10 MG tablet Take 10 mg by mouth at bedtime.    Historical Provider, MD  Multiple Vitamin (MULTIVITAMIN) tablet Take 1 tablet by mouth daily.    Historical  Provider, MD  omeprazole (PRILOSEC) 20 MG capsule TAKE 1 CAPSULE BY MOUTH EVERY DAY 01/13/17   Remus LofflerAngel S Jones, PA-C  SAVELLA 100 MG TABS tablet Take 100 mg by mouth 2 (two) times daily. 04/29/15   Historical Provider, MD  SUPER B COMPLEX/C PO Take 1 tablet by mouth 2 (two) times daily.    Historical Provider, MD  tiZANidine (ZANAFLEX) 4 MG tablet Take 4 mg by mouth 3 (three) times daily.    Historical Provider, MD  zinc gluconate 50 MG tablet Take 50 mg by mouth 2 (two) times daily.    Historical Provider, MD    Family History Family History  Problem Relation Age of Onset  . Emphysema Father     died at age 61    Social History Social History  Substance Use Topics  . Smoking status: Former Games developermoker  . Smokeless tobacco: Never Used  . Alcohol use No     Allergies   Bee venom; Cherry flavor; Citalopram hydrobromide; and Tramadol   Review of Systems Review of Systems  Constitutional: Negative for appetite change and fatigue.  HENT: Negative for congestion, ear discharge and sinus pressure.   Eyes: Negative for discharge.  Respiratory: Negative for cough.   Cardiovascular: Negative for chest pain.  Gastrointestinal: Negative for abdominal pain and diarrhea.  Genitourinary: Negative for frequency and hematuria.  Musculoskeletal: Positive for arthralgias (Left wrist pain). Negative for back pain.  Skin: Negative for rash.  Neurological: Negative for seizures, syncope and headaches.  Psychiatric/Behavioral: Negative for hallucinations.  All other systems reviewed and are negative.   Physical Exam Updated Vital Signs BP 124/77 (BP Location: Right Arm)   Pulse 71   Temp 97.9 F (36.6 C) (Oral)   Resp 18   Ht 5' (1.524 m)   Wt 200 lb (90.7 kg)   SpO2 97%   BMI 39.06 kg/m   Physical Exam  Constitutional: She is oriented to person, place, and time. She appears well-developed.  HENT:  Head: Normocephalic.  Eyes: Conjunctivae are normal.  Neck: No tracheal deviation  present.  Cardiovascular:  No murmur heard. Musculoskeletal:  Deformed left wrist.  Neurological: She is oriented to person, place, and time.  Skin: Skin is warm.  Psychiatric: She has a normal mood and affect.    ED Treatments / Results  DIAGNOSTIC STUDIES: Oxygen Saturation is 97% on RA, normal by my interpretation.    COORDINATION OF CARE: 12:22 PM Discussed treatment plan with pt at bedside and pt agreed to plan. I will check the x-ray of the patient's left wrist.  Labs (all labs ordered are listed, but only abnormal results are displayed) Labs Reviewed - No data to display  EKG  EKG Interpretation None       Radiology No results found.  Procedures Reduction of fracture Date/Time: 02/12/2017 4:01  PM Performed by: Bethann Berkshire Authorized by: Bethann Berkshire  Comments: Patient has a distal radial fracture that's angulated. Patient was given a lot of pain medicine and a hematoma block was performed with 10 mL of 1% lidocaine. Patient tolerated the procedure well. She was anesthetized well. The fracture was reduced without difficulty. Neurovascular exam was normal after reduction and splint was applied    (including critical care time)  Medications Ordered in ED Medications  HYDROmorphone (DILAUDID) injection 1 mg (1 mg Intravenous Given 02/12/17 1230)  ondansetron (ZOFRAN) injection 4 mg (4 mg Intravenous Given 02/12/17 1231)     Initial Impression / Assessment and Plan / ED Course  I have reviewed the triage vital signs and the nursing notes.  Pertinent labs & imaging results that were available during my care of the patient were reviewed by me and considered in my medical decision making (see chart for details).    Patient with a comminuted distal radial fracture. Patient will follow-up with Dr. Eulah Pont on Monday at 8:30   Final Clinical Impressions(s) / ED Diagnoses   Final diagnoses:  None    New Prescriptions New Prescriptions   No medications on file     The chart was scribed for me under my direct supervision.  I personally performed the history, physical, and medical decision making and all procedures in the evaluation of this patient.Bethann Berkshire, MD 02/12/17 [phone]

## 2017-02-12 NOTE — ED Triage Notes (Signed)
Pt was picking up things out of the yard and the wind blew her down.  She landed on left wrist with deformity present.

## 2017-02-15 ENCOUNTER — Encounter (HOSPITAL_BASED_OUTPATIENT_CLINIC_OR_DEPARTMENT_OTHER): Payer: Self-pay | Admitting: *Deleted

## 2017-02-15 DIAGNOSIS — M25512 Pain in left shoulder: Secondary | ICD-10-CM | POA: Diagnosis not present

## 2017-02-16 ENCOUNTER — Encounter (HOSPITAL_BASED_OUTPATIENT_CLINIC_OR_DEPARTMENT_OTHER): Payer: Self-pay | Admitting: *Deleted

## 2017-02-16 ENCOUNTER — Ambulatory Visit (HOSPITAL_BASED_OUTPATIENT_CLINIC_OR_DEPARTMENT_OTHER): Payer: PPO | Admitting: Anesthesiology

## 2017-02-16 ENCOUNTER — Encounter (HOSPITAL_BASED_OUTPATIENT_CLINIC_OR_DEPARTMENT_OTHER)
Admission: RE | Disposition: A | Discharge status: Home / Self Care | Payer: Self-pay | Source: Ambulatory Visit | Attending: Orthopedic Surgery

## 2017-02-16 ENCOUNTER — Ambulatory Visit (HOSPITAL_BASED_OUTPATIENT_CLINIC_OR_DEPARTMENT_OTHER)
Admission: RE | Admit: 2017-02-16 | Discharge: 2017-02-16 | Disposition: A | Discharge status: Home / Self Care | Payer: PPO | Source: Ambulatory Visit | Attending: Orthopedic Surgery | Admitting: Orthopedic Surgery

## 2017-02-16 DIAGNOSIS — K219 Gastro-esophageal reflux disease without esophagitis: Secondary | ICD-10-CM | POA: Diagnosis not present

## 2017-02-16 DIAGNOSIS — Z7982 Long term (current) use of aspirin: Secondary | ICD-10-CM | POA: Diagnosis not present

## 2017-02-16 DIAGNOSIS — J45909 Unspecified asthma, uncomplicated: Secondary | ICD-10-CM | POA: Diagnosis not present

## 2017-02-16 DIAGNOSIS — Z79899 Other long term (current) drug therapy: Secondary | ICD-10-CM | POA: Insufficient documentation

## 2017-02-16 DIAGNOSIS — W19XXXA Unspecified fall, initial encounter: Secondary | ICD-10-CM | POA: Diagnosis not present

## 2017-02-16 DIAGNOSIS — Z87891 Personal history of nicotine dependence: Secondary | ICD-10-CM | POA: Insufficient documentation

## 2017-02-16 DIAGNOSIS — E039 Hypothyroidism, unspecified: Secondary | ICD-10-CM | POA: Insufficient documentation

## 2017-02-16 DIAGNOSIS — S52572A Other intraarticular fracture of lower end of left radius, initial encounter for closed fracture: Secondary | ICD-10-CM | POA: Diagnosis not present

## 2017-02-16 DIAGNOSIS — M797 Fibromyalgia: Secondary | ICD-10-CM | POA: Diagnosis not present

## 2017-02-16 DIAGNOSIS — X58XXXA Exposure to other specified factors, initial encounter: Secondary | ICD-10-CM | POA: Insufficient documentation

## 2017-02-16 DIAGNOSIS — S52502A Unspecified fracture of the lower end of left radius, initial encounter for closed fracture: Secondary | ICD-10-CM | POA: Diagnosis not present

## 2017-02-16 DIAGNOSIS — F419 Anxiety disorder, unspecified: Secondary | ICD-10-CM | POA: Insufficient documentation

## 2017-02-16 DIAGNOSIS — S62102A Fracture of unspecified carpal bone, left wrist, initial encounter for closed fracture: Secondary | ICD-10-CM

## 2017-02-16 HISTORY — DX: Gastro-esophageal reflux disease without esophagitis: K21.9

## 2017-02-16 HISTORY — DX: Unspecified fracture of the lower end of left radius, initial encounter for closed fracture: S52.502A

## 2017-02-16 HISTORY — DX: Unspecified convulsions: R56.9

## 2017-02-16 HISTORY — PX: ORIF WRIST FRACTURE: SHX2133

## 2017-02-16 HISTORY — DX: Anxiety disorder, unspecified: F41.9

## 2017-02-16 SURGERY — OPEN REDUCTION INTERNAL FIXATION (ORIF) WRIST FRACTURE
Anesthesia: Monitor Anesthesia Care | Site: Wrist | Laterality: Left

## 2017-02-16 MED ORDER — FENTANYL CITRATE (PF) 100 MCG/2ML IJ SOLN
INTRAMUSCULAR | Status: AC
  Filled 2017-02-16: qty 2

## 2017-02-16 MED ORDER — METHOCARBAMOL 500 MG PO TABS: 500.0000 mg | ORAL_TABLET | Freq: Four times a day (QID) | ORAL | 0 refills | Status: DC | PRN

## 2017-02-16 MED ORDER — ONDANSETRON HCL 4 MG/2ML IJ SOLN
INTRAMUSCULAR | Status: AC
  Filled 2017-02-16: qty 2

## 2017-02-16 MED ORDER — MIDAZOLAM HCL 2 MG/2ML IJ SOLN
INTRAMUSCULAR | Status: AC
  Filled 2017-02-16: qty 2

## 2017-02-16 MED ORDER — FENTANYL CITRATE (PF) 100 MCG/2ML IJ SOLN
INTRAMUSCULAR | Status: AC
  Filled 2017-02-16: qty 4

## 2017-02-16 MED ORDER — FENTANYL CITRATE (PF) 100 MCG/2ML IJ SOLN
50.0000 ug | INTRAMUSCULAR | Status: DC | PRN
  Administered 2017-02-16 (×2): 50 ug via INTRAVENOUS

## 2017-02-16 MED ORDER — ACETAMINOPHEN 500 MG PO TABS: 1000.0000 mg | ORAL_TABLET | Freq: Once | ORAL | Status: DC

## 2017-02-16 MED ORDER — PROMETHAZINE HCL 25 MG/ML IJ SOLN: 6.2500 mg | INTRAMUSCULAR | Status: DC | PRN

## 2017-02-16 MED ORDER — BUPIVACAINE-EPINEPHRINE (PF) 0.5% -1:200000 IJ SOLN
INTRAMUSCULAR | Status: DC | PRN
  Administered 2017-02-16: 30 mL via PERINEURAL

## 2017-02-16 MED ORDER — PROPOFOL 500 MG/50ML IV EMUL
INTRAVENOUS | Status: DC | PRN
  Administered 2017-02-16: 75 ug/kg/min via INTRAVENOUS

## 2017-02-16 MED ORDER — DEXAMETHASONE SODIUM PHOSPHATE 10 MG/ML IJ SOLN
INTRAMUSCULAR | Status: AC
  Filled 2017-02-16: qty 1

## 2017-02-16 MED ORDER — MIDAZOLAM HCL 2 MG/2ML IJ SOLN
1.0000 mg | INTRAMUSCULAR | Status: DC | PRN
  Administered 2017-02-16: 2 mg via INTRAVENOUS

## 2017-02-16 MED ORDER — LACTATED RINGERS IV SOLN
INTRAVENOUS | Status: DC
  Administered 2017-02-16 (×2): via INTRAVENOUS

## 2017-02-16 MED ORDER — CEFAZOLIN SODIUM-DEXTROSE 2-4 GM/100ML-% IV SOLN
INTRAVENOUS | Status: AC
  Filled 2017-02-16: qty 100

## 2017-02-16 MED ORDER — SCOPOLAMINE 1 MG/3DAYS TD PT72: 1.0000 | MEDICATED_PATCH | Freq: Once | TRANSDERMAL | Status: DC | PRN

## 2017-02-16 MED ORDER — ONDANSETRON HCL 4 MG PO TABS: 4.0000 mg | ORAL_TABLET | Freq: Three times a day (TID) | ORAL | 0 refills | Status: DC | PRN

## 2017-02-16 MED ORDER — CHLORHEXIDINE GLUCONATE 4 % EX LIQD: 60.0000 mL | Freq: Once | CUTANEOUS | Status: DC

## 2017-02-16 MED ORDER — PROPOFOL 10 MG/ML IV BOLUS
INTRAVENOUS | Status: DC | PRN
  Administered 2017-02-16 (×2): 20 mg via INTRAVENOUS

## 2017-02-16 MED ORDER — ONDANSETRON HCL 4 MG/2ML IJ SOLN
INTRAMUSCULAR | Status: DC | PRN
  Administered 2017-02-16: 4 mg via INTRAVENOUS

## 2017-02-16 MED ORDER — OXYCODONE-ACETAMINOPHEN 5-325 MG PO TABS
ORAL_TABLET | ORAL | Status: AC
  Filled 2017-02-16: qty 1

## 2017-02-16 MED ORDER — BUPIVACAINE HCL (PF) 0.5 % IJ SOLN
INTRAMUSCULAR | Status: AC
  Filled 2017-02-16: qty 30

## 2017-02-16 MED ORDER — LIDOCAINE 2% (20 MG/ML) 5 ML SYRINGE
INTRAMUSCULAR | Status: AC
  Filled 2017-02-16: qty 5

## 2017-02-16 MED ORDER — CEFAZOLIN SODIUM-DEXTROSE 2-4 GM/100ML-% IV SOLN
2.0000 g | INTRAVENOUS | Status: AC
  Administered 2017-02-16: 2 g via INTRAVENOUS

## 2017-02-16 MED ORDER — LACTATED RINGERS IV SOLN
INTRAVENOUS | Status: DC
  Administered 2017-02-16: 10 mL/h via INTRAVENOUS

## 2017-02-16 MED ORDER — PROPOFOL 10 MG/ML IV BOLUS
INTRAVENOUS | Status: AC
  Filled 2017-02-16: qty 20

## 2017-02-16 MED ORDER — OXYCODONE-ACETAMINOPHEN 5-325 MG PO TABS: 1.0000 | ORAL_TABLET | ORAL | 0 refills | Status: DC | PRN

## 2017-02-16 MED ORDER — FENTANYL CITRATE (PF) 100 MCG/2ML IJ SOLN: 25.0000 ug | INTRAMUSCULAR | Status: DC | PRN

## 2017-02-16 MED ORDER — OXYCODONE-ACETAMINOPHEN 5-325 MG PO TABS
1.0000 | ORAL_TABLET | Freq: Once | ORAL | Status: AC
  Administered 2017-02-16: 1 via ORAL

## 2017-02-16 SURGICAL SUPPLY — 70 items
BANDAGE ACE 4X5 VEL STRL LF (GAUZE/BANDAGES/DRESSINGS) ×2 IMPLANT
BIT DRILL 2.2 SS TIBIAL (BIT) ×2 IMPLANT
BLADE SURG 15 STRL LF DISP TIS (BLADE) ×2 IMPLANT
BLADE SURG 15 STRL SS (BLADE) ×2
BNDG ESMARK 4X9 LF (GAUZE/BANDAGES/DRESSINGS) ×2 IMPLANT
CHLORAPREP W/TINT 26ML (MISCELLANEOUS) ×2 IMPLANT
CLSR STERI-STRIP ANTIMIC 1/2X4 (GAUZE/BANDAGES/DRESSINGS) ×2 IMPLANT
CORDS BIPOLAR (ELECTRODE) ×2 IMPLANT
COVER BACK TABLE 60X90IN (DRAPES) ×2 IMPLANT
CUFF TOURNIQUET SINGLE 18IN (TOURNIQUET CUFF) IMPLANT
CUFF TOURNIQUET SINGLE 24IN (TOURNIQUET CUFF) ×2 IMPLANT
DECANTER SPIKE VIAL GLASS SM (MISCELLANEOUS) IMPLANT
DRAPE EXTREMITY T 121X128X90 (DRAPE) ×2 IMPLANT
DRAPE IMP U-DRAPE 54X76 (DRAPES) ×2 IMPLANT
DRAPE OEC MINIVIEW 54X84 (DRAPES) ×2 IMPLANT
DRAPE SURG 17X23 STRL (DRAPES) IMPLANT
DRSG EMULSION OIL 3X3 NADH (GAUZE/BANDAGES/DRESSINGS) ×2 IMPLANT
GAUZE SPONGE 4X4 12PLY STRL (GAUZE/BANDAGES/DRESSINGS) ×2 IMPLANT
GAUZE XEROFORM 1X8 LF (GAUZE/BANDAGES/DRESSINGS) IMPLANT
GLOVE BIO SURGEON STRL SZ7.5 (GLOVE) ×4 IMPLANT
GLOVE BIOGEL PI IND STRL 7.0 (GLOVE) ×2 IMPLANT
GLOVE BIOGEL PI IND STRL 8 (GLOVE) ×2 IMPLANT
GLOVE BIOGEL PI INDICATOR 7.0 (GLOVE) ×2
GLOVE BIOGEL PI INDICATOR 8 (GLOVE) ×2
GLOVE ECLIPSE 6.5 STRL STRAW (GLOVE) ×2 IMPLANT
GOWN STRL REUS W/ TWL LRG LVL3 (GOWN DISPOSABLE) ×2 IMPLANT
GOWN STRL REUS W/ TWL XL LVL3 (GOWN DISPOSABLE) ×1 IMPLANT
GOWN STRL REUS W/TWL LRG LVL3 (GOWN DISPOSABLE) ×2
GOWN STRL REUS W/TWL XL LVL3 (GOWN DISPOSABLE) ×1
K-WIRE 1.6 (WIRE) ×2
K-WIRE FX5X1.6XNS BN SS (WIRE) ×2
KWIRE FX5X1.6XNS BN SS (WIRE) ×2 IMPLANT
NEEDLE HYPO 25X1 1.5 SAFETY (NEEDLE) ×2 IMPLANT
NS IRRIG 1000ML POUR BTL (IV SOLUTION) ×2 IMPLANT
PACK BASIN DAY SURGERY FS (CUSTOM PROCEDURE TRAY) ×2 IMPLANT
PAD CAST 4YDX4 CTTN HI CHSV (CAST SUPPLIES) ×1 IMPLANT
PADDING CAST ABS 4INX4YD NS (CAST SUPPLIES) ×1
PADDING CAST ABS COTTON 4X4 ST (CAST SUPPLIES) ×1 IMPLANT
PADDING CAST COTTON 4X4 STRL (CAST SUPPLIES) ×1
PEG LOCKING SMOOTH 2.2X18 (Screw) ×4 IMPLANT
PEG LOCKING SMOOTH 2.2X20 (Screw) ×8 IMPLANT
PLATE NARROW DVR LEFT (Plate) ×2 IMPLANT
SCREW LOCK 12X2.7X 3 LD (Screw) ×1 IMPLANT
SCREW LOCK 14X2.7X 3 LD TPR (Screw) ×2 IMPLANT
SCREW LOCKING 2.7X12MM (Screw) ×1 IMPLANT
SCREW LOCKING 2.7X13MM (Screw) ×2 IMPLANT
SCREW LOCKING 2.7X14 (Screw) ×2 IMPLANT
SLEEVE SCD COMPRESS KNEE MED (MISCELLANEOUS) IMPLANT
SPLINT FAST PLASTER 5X30 (CAST SUPPLIES)
SPLINT PLASTER CAST FAST 5X30 (CAST SUPPLIES) IMPLANT
SPLINT PLASTER CAST XFAST 3X15 (CAST SUPPLIES) IMPLANT
SPLINT PLASTER CAST XFAST 4X15 (CAST SUPPLIES) ×10 IMPLANT
SPLINT PLASTER XTRA FAST SET 4 (CAST SUPPLIES) ×10
SPLINT PLASTER XTRA FASTSET 3X (CAST SUPPLIES)
SUCTION FRAZIER HANDLE 10FR (MISCELLANEOUS)
SUCTION TUBE FRAZIER 10FR DISP (MISCELLANEOUS) IMPLANT
SUT ETHILON 3 0 PS 1 (SUTURE) IMPLANT
SUT MON AB 2-0 CT1 36 (SUTURE) ×2 IMPLANT
SUT MON AB 4-0 PC3 18 (SUTURE) ×2 IMPLANT
SUT PROLENE 3 0 PS 2 (SUTURE) IMPLANT
SUT VIC AB 0 SH 27 (SUTURE) IMPLANT
SUT VIC AB 2-0 SH 27 (SUTURE)
SUT VIC AB 2-0 SH 27XBRD (SUTURE) IMPLANT
SUT VIC AB 3-0 FS2 27 (SUTURE) IMPLANT
SYR BULB 3OZ (MISCELLANEOUS) ×2 IMPLANT
SYR CONTROL 10ML LL (SYRINGE) ×2 IMPLANT
TOWEL OR 17X24 6PK STRL BLUE (TOWEL DISPOSABLE) ×2 IMPLANT
TOWEL OR NON WOVEN STRL DISP B (DISPOSABLE) ×2 IMPLANT
TUBE CONNECTING 20X1/4 (TUBING) IMPLANT
UNDERPAD 30X30 (UNDERPADS AND DIAPERS) ×2 IMPLANT

## 2017-02-16 NOTE — Transfer of Care (Signed)
Immediate Anesthesia Transfer of Care Note  Patient: Alicia Russo  Procedure(s) Performed: Procedure(s): OPEN REDUCTION INTERNAL FIXATION (ORIF) LEFT WRIST FRACTURE (Left)  Patient Location: PACU  Anesthesia Type:MAC and Regional  Level of Consciousness: awake, alert  and oriented  Airway & Oxygen Therapy: Patient Spontanous Breathing and Patient connected to face mask oxygen  Post-op Assessment: Report given to RN and Post -op Vital signs reviewed and stable  Post vital signs: Reviewed and stable  Last Vitals:  Vitals:   02/16/17 1050 02/16/17 1247  BP:  (!) 116/56  Pulse: 82 73  Resp: 16 16  Temp:      Last Pain:  Vitals:   02/16/17 0941  PainSc: 7       Patients Stated Pain Goal: 2 (65/53/74 8270)  Complications: No apparent anesthesia complications

## 2017-02-16 NOTE — Anesthesia Preprocedure Evaluation (Addendum)
Anesthesia Evaluation  Patient identified by MRN, date of birth, ID band Patient awake    Reviewed: Allergy & Precautions, NPO status , Patient's Chart, lab work & pertinent test results  Airway Mallampati: II  TM Distance: >3 FB Neck ROM: Full    Dental  (+) Dental Advisory Given   Pulmonary asthma , former smoker,    breath sounds clear to auscultation       Cardiovascular negative cardio ROS   Rhythm:Regular Rate:Normal     Neuro/Psych Seizures -, Well Controlled,  S/p intracranial AVM excision.    GI/Hepatic Neg liver ROS, GERD  ,  Endo/Other  Hypothyroidism   Renal/GU negative Renal ROS     Musculoskeletal  (+) Fibromyalgia -  Abdominal   Peds  Hematology negative hematology ROS (+)   Anesthesia Other Findings   Reproductive/Obstetrics                            Lab Results  Component Value Date   WBC 9.8 10/05/2016   HGB 12.7 01/09/2015   HCT 44.6 10/05/2016   MCV 91 10/05/2016   PLT 332 10/05/2016   Lab Results  Component Value Date   CREATININE 0.66 10/05/2016   BUN 6 (L) 10/05/2016   NA 144 10/05/2016   K 4.8 10/05/2016   CL 100 10/05/2016   CO2 28 10/05/2016    Anesthesia Physical Anesthesia Plan  ASA: II  Anesthesia Plan: MAC and Regional   Post-op Pain Management:  Regional for Post-op pain   Induction: Intravenous  Airway Management Planned: Natural Airway and Simple Face Mask  Additional Equipment:   Intra-op Plan:   Post-operative Plan:   Informed Consent: I have reviewed the patients History and Physical, chart, labs and discussed the procedure including the risks, benefits and alternatives for the proposed anesthesia with the patient or authorized representative who has indicated his/her understanding and acceptance.   Dental advisory given  Plan Discussed with: CRNA  Anesthesia Plan Comments:        Anesthesia Quick Evaluation

## 2017-02-16 NOTE — Op Note (Signed)
02/16/2017  12:24 PM  PATIENT:  Alicia Russo    PRE-OPERATIVE DIAGNOSIS:  LEFT WRIST FRACTURE  POST-OPERATIVE DIAGNOSIS:  Same  PROCEDURE:  OPEN REDUCTION INTERNAL FIXATION (ORIF) LEFT WRIST FRACTURE  SURGEON:  Tyjae Issa, Jewel BaizeIMOTHY D, MD  ASSISTANT: Aquilla HackerHenry Martensen, PA-C, he was present and scrubbed throughout the case, critical for completion in a timely fashion, and for retraction, instrumentation, and closure.   ANESTHESIA:   gen  PREOPERATIVE INDICATIONS:  Alicia Russo is a  61 y.o. female with a diagnosis of LEFT WRIST FRACTURE who failed conservative measures and elected for surgical management.    The risks benefits and alternatives were discussed with the patient preoperatively including but not limited to the risks of infection, bleeding, nerve injury, cardiopulmonary complications, the need for revision surgery, among others, and the patient was willing to proceed.  OPERATIVE IMPLANTS: DVR plate  OPERATIVE FINDINGS: comminuted articular fracture with >3 parts  BLOOD LOSS: min  COMPLICATIONS: none  TOURNIQUET TIME: 60min  OPERATIVE PROCEDURE:  Patient was identified in the preoperative holding area and site was marked by me She was transported to the operating theater and placed on the table in supine position taking care to pad all bony prominences. After a preincinduction time out anesthesia was induced. The left upper extremity was prepped and draped in normal sterile fashion and a pre-incision timeout was performed. She received ancef for preoperative antibiotics.   I made a 5 cm incision centered over her FCR tendon and dissected down carefully to the level of the flexor tendon sheath and incise this longitudinally and retracted the FCR radially and incised the dorsal aspect of the sheath.   I bluntly dissected the FPL muscle belly away from the brachioradialis and then sharply incised the pronator tendon from the distal radius and from the wrist capsule. I Elevated  this off the bone the fractures visible.   I released the brachioradialis from its insertion. I then debrided the fracture and performed a manual reduction.   I selected a plate and I placed it on the bone. I pinned it into place and was happy on multiple radiographic views with it's placement. I then fixed the plate distally with the locking pegs. I confirmed no articular penetration with the pegs and that none were prominent dorsally.   I then reduced the plate to the shaft improving the volar and radial tilt of her distal radius.  I was happy with the final fluoro xrays    I thoroughly irrigated the wound and closed the pronator over top of the plate and then closed the skin in layers with absorbable stitch. Sterile dressing was applied using the PACU in stable condition.  POST OPERATIVE PLAN: NWB, Splint full time. Ambulate for DVT px.

## 2017-02-16 NOTE — Anesthesia Procedure Notes (Signed)
Anesthesia Regional Block: Axillary brachial plexus block   Pre-Anesthetic Checklist: ,, timeout performed, Correct Patient, Correct Site, Correct Laterality, Correct Procedure, Correct Position, site marked, Risks and benefits discussed,  Surgical consent,  Pre-op evaluation,  At surgeon's request and post-op pain management  Laterality: Left  Prep: chloraprep       Needles:  Injection technique: Single-shot  Needle Type: Echogenic Stimulator Needle     Needle Length: 9cm  Needle Gauge: 21     Additional Needles:   Procedures: ultrasound guided, nerve stimulator,,,,,,   Nerve Stimulator or Paresthesia:  Response: musculocutaneous, median, radial and ulnar, 0.5 mA,   Additional Responses:   Narrative:  Start time: 02/16/2017 10:35 AM End time: 02/16/2017 10:43 AM Injection made incrementally with aspirations every 5 mL.  Performed by: Personally  Anesthesiologist: Marcene DuosFITZGERALD, Charnee Turnipseed

## 2017-02-16 NOTE — Anesthesia Postprocedure Evaluation (Signed)
Anesthesia Post Note  Patient: Eusebio MeLynn S Shiller  Procedure(s) Performed: Procedure(s) (LRB): OPEN REDUCTION INTERNAL FIXATION (ORIF) LEFT WRIST FRACTURE (Left)  Patient location during evaluation: PACU Anesthesia Type: Regional Level of consciousness: sedated and patient cooperative Pain management: pain level controlled Vital Signs Assessment: post-procedure vital signs reviewed and stable Respiratory status: spontaneous breathing Cardiovascular status: stable Anesthetic complications: no       Last Vitals:  Vitals:   02/16/17 1315 02/16/17 1349  BP: 137/88 (!) 146/87  Pulse: 71 69  Resp: 16 20  Temp:  36.7 C    Last Pain:  Vitals:   02/16/17 1349  TempSrc: Oral  PainSc: 4                  Lewie LoronJohn Winifred Bodiford

## 2017-02-16 NOTE — Progress Notes (Signed)
Assisted Dr. Rob Fitzgerald with left, ultrasound guided, axillary block. Side rails up, monitors on throughout procedure. See vital signs in flow sheet. Tolerated Procedure well. 

## 2017-02-16 NOTE — Discharge Instructions (Signed)
Keep wrist elevated with ice as much as possible to reduce pain and swelling.  Diet: As you were doing prior to hospitalization   Shower:  You have a splint on, leave the splint in place and keep the splint dry with a plastic bag.  Dressing:  You have a splint - leave the splint in place and we will change your bandages during your first follow-up appointment.    Activity:  Increase activity slowly as tolerated, but follow the weight bearing instructions below.  The rules on driving is that you can not be taking narcotics while you drive, and you must feel in control of the vehicle.    Weight Bearing:  Non weight bearing affected wrist.  Sling for comfort.   To prevent constipation: you may use a stool softener such as -  Colace (over the counter) 100 mg by mouth twice a day  Drink plenty of fluids (prune juice may be helpful) and high fiber foods Miralax (over the counter) for constipation as needed.    Itching:  If you experience itching with your medications, try taking only a single pain pill, or even half a pain pill at a time.  You may take up to 10 pain pills per day, and you can also use benadryl over the counter for itching or also to help with sleep.   Precautions:  If you experience chest pain or shortness of breath - call 911 immediately for transfer to the hospital emergency department!!  If you develop a fever greater that 101 F, purulent drainage from wound, increased redness or drainage from wound, or calf pain -- Call the office at [phone]                                                Follow- Up Appointment:  Please call for an appointment to be seen in 2 weeks [place] - [phone]

## 2017-02-16 NOTE — H&P (Signed)
ORTHOPAEDIC CONSULTATION  REQUESTING PHYSICIAN: Renette Butters, MD  Chief Complaint: left DR fracture  HPI: Alicia Russo is a 61 y.o. female who complains of a fall onto the left arm  Past Medical History:  Diagnosis Date  . Anxiety   . Asthma   . AVM (arteriovenous malformation)    Intracranial with seizures  . Closed fracture of left distal radius   . Fibromyalgia   . GERD (gastroesophageal reflux disease)   . Hyperlipidemia   . Hypothyroidism   . Seizures ([place])    AVM repair   Past Surgical History:  Procedure Laterality Date  . CHOLECYSTECTOMY    . CRANIOTOMY     for AVM   Social History   Social History  . Marital status: Single    Spouse name: N/A  . Number of children: N/A  . Years of education: N/A   Social History Main Topics  . Smoking status: Former Research scientist (life sciences)  . Smokeless tobacco: Never Used  . Alcohol use No  . Drug use: No  . Sexual activity: Not Asked   Other Topics Concern  . None   Social History Narrative  . None   Family History  Problem Relation Age of Onset  . Emphysema Father     died at age 64   Allergies  Allergen Reactions  . Bee Venom     Cant breath, swell up  . Cherry Flavor Hives      dizziness  . Citalopram Hydrobromide     Mood swings, gets "really really mean"  . Tramadol     unknown   Prior to Admission medications   Medication Sig Start Date End Date Taking? Authorizing Provider  albuterol (PROVENTIL HFA;VENTOLIN HFA) 108 (90 Base) MCG/ACT inhaler Inhale 2 puffs into the lungs every 6 (six) hours as needed for wheezing or shortness of breath. 09/07/16  Yes Terald Sleeper, PA-C  ALPRAZolam Duanne Moron) 0.5 MG tablet Take 1 tablet (0.5 mg total) by mouth 2 (two) times daily as needed. Patient taking differently: Take 0.5 mg by mouth 2 (two) times daily as needed for anxiety (panic attacks).  11/03/16  Yes Terald Sleeper, PA-C  aspirin 81 MG EC tablet Take 81 mg by mouth 2 (two) times daily.    Yes Historical  Provider, MD  Biotin 10 MG TABS Take 1 tablet by mouth 2 (two) times daily.    Yes Historical Provider, MD  Cholecalciferol (VITAMIN D3) 2000 UNITS TABS Take 1 tablet by mouth 2 (two) times daily.   Yes Historical Provider, MD  Cyanocobalamin (VITAMIN B-12 PO) Take 1 tablet by mouth 2 (two) times daily.   Yes Historical Provider, MD  fluticasone (FLONASE) 50 MCG/ACT nasal spray Place 1 spray into both nostrils 2 (two) times daily.    Yes Historical Provider, MD  HYDROcodone-acetaminophen (NORCO/VICODIN) 5-325 MG tablet Take 1 tablet by mouth every 6 (six) hours as needed for moderate pain. 02/12/17  Yes Milton Ferguson, MD  ibuprofen (ADVIL,MOTRIN) 800 MG tablet TAKE 1 TABLET BY MOUTH THREE TIMES DAILY Patient taking differently: take 1 tablet by mouth twice daily 02/03/17  Yes Terald Sleeper, PA-C  levothyroxine (SYNTHROID, LEVOTHROID) 125 MCG tablet Take 125 mcg by mouth daily.   Yes Historical Provider, MD  magnesium 30 MG tablet Take 30 mg by mouth 2 (two) times daily.   Yes Historical Provider, MD  montelukast (SINGULAIR) 10 MG tablet Take 10 mg by mouth at bedtime.   Yes Historical Provider, MD  Multiple Vitamin (MULTIVITAMIN) tablet Take 1 tablet by mouth daily.   Yes Historical Provider, MD  omeprazole (PRILOSEC) 20 MG capsule TAKE 1 CAPSULE BY MOUTH EVERY DAY 01/13/17  Yes Terald Sleeper, PA-C  SAVELLA 100 MG TABS tablet Take 100 mg by mouth 2 (two) times daily. 04/29/15  Yes Historical Provider, MD  SUPER B COMPLEX/C PO Take 1 tablet by mouth 2 (two) times daily.   Yes Historical Provider, MD  tiZANidine (ZANAFLEX) 4 MG tablet Take 4 mg by mouth 2 (two) times daily as needed for muscle spasms.    Yes Historical Provider, MD  albuterol (PROVENTIL) (2.5 MG/3ML) 0.083% nebulizer solution Take 2.5 mg by nebulization every 6 (six) hours as needed for wheezing or shortness of breath.    Historical Provider, MD   No results found.  Positive ROS: All other systems have been reviewed and were otherwise  negative with the exception of those mentioned in the HPI and as above.  Labs cbc No results for input(s): WBC, HGB, HCT, PLT in the last 72 hours.  Labs inflam No results for input(s): CRP in the last 72 hours.  Invalid input(s): ESR  Labs coag No results for input(s): INR, PTT in the last 72 hours.  Invalid input(s): PT  No results for input(s): NA, K, CL, CO2, GLUCOSE, BUN, CREATININE, CALCIUM in the last 72 hours.  Physical Exam: Vitals:   02/16/17 0935  BP: (!) 163/86  Resp: 14  Temp: 98.3 F (36.8 C)   General: Alert, no acute distress Cardiovascular: No pedal edema Respiratory: No cyanosis, no use of accessory musculature GI: No organomegaly, abdomen is soft and non-tender Skin: No lesions in the area of chief complaint other than those listed below in MSK exam.  Neurologic: Sensation intact distally save for the below mentioned MSK exam Psychiatric: Patient is competent for consent with normal mood and affect Lymphatic: No axillary or cervical lymphadenopathy  MUSCULOSKELETAL:  LUE: NVI, compartments soft Other extremities are atraumatic with painless ROM and NVI.  Assessment: Left DR fracture  Plan: ORIF today   Renette Butters, MD Cell [phone]   02/16/2017 10:13 AM

## 2017-02-17 ENCOUNTER — Encounter (HOSPITAL_BASED_OUTPATIENT_CLINIC_OR_DEPARTMENT_OTHER): Payer: Self-pay | Admitting: Orthopedic Surgery

## 2017-02-26 DIAGNOSIS — S52502D Unspecified fracture of the lower end of left radius, subsequent encounter for closed fracture with routine healing: Secondary | ICD-10-CM | POA: Diagnosis not present

## 2017-03-10 ENCOUNTER — Encounter: Payer: Self-pay | Admitting: Physician Assistant

## 2017-03-10 ENCOUNTER — Ambulatory Visit (INDEPENDENT_AMBULATORY_CARE_PROVIDER_SITE_OTHER): Payer: PPO | Admitting: Physician Assistant

## 2017-03-10 VITALS — BP 155/106 | HR 81 | Temp 98.0°F | Ht 60.0 in | Wt 199.8 lb

## 2017-03-10 DIAGNOSIS — M797 Fibromyalgia: Secondary | ICD-10-CM | POA: Diagnosis not present

## 2017-03-10 DIAGNOSIS — I10 Essential (primary) hypertension: Secondary | ICD-10-CM

## 2017-03-10 MED ORDER — LOSARTAN POTASSIUM 50 MG PO TABS: 50.0000 mg | ORAL_TABLET | Freq: Every day | ORAL | 3 refills | Status: DC

## 2017-03-10 MED ORDER — METHOCARBAMOL 500 MG PO TABS: 500.0000 mg | ORAL_TABLET | Freq: Four times a day (QID) | ORAL | 0 refills | Status: DC | PRN

## 2017-03-10 NOTE — Progress Notes (Signed)
BP (!) 155/106   Pulse 81   Temp 98 F (36.7 C) (Oral)   Ht 5' (1.524 m)   Wt 199 lb 12.8 oz (90.6 kg)   BMI 39.02 kg/m    Subjective:    Patient ID: Alicia Russo, female    DOB: March 11, 1956, 61 y.o.   MRN: [id number]  HPI: Alicia Russo is a 61 y.o. female presenting on 03/10/2017 for Medication Management  This patient comes in for periodic recheck on medications and conditions including fibromyalgia, htn. The patient was placed on Robaxin by the orthopedist when she broke her arm. She states that she is able to take this quite well and not be sleepy with it. She would like to discontinue Zanaflex. The patient also is having elevated blood pressure. She has had borderline readings in the last couple of visits. Start her with a very low dose blood pressure medication and plan to recheck her in 4 weeks..   All medications are reviewed today. There are no reports of any problems with the medications. All of the medical conditions are reviewed and updated.  Lab work is reviewed and will be ordered as medically necessary. There are no new problems reported with today's visit.   Relevant past medical, surgical, family and social history reviewed and updated as indicated. Allergies and medications reviewed and updated.  Past Medical History:  Diagnosis Date  . Anxiety   . Asthma   . AVM (arteriovenous malformation)    Intracranial with seizures  . Closed fracture of left distal radius   . Fibromyalgia   . GERD (gastroesophageal reflux disease)   . Hyperlipidemia   . Hypothyroidism   . Seizures (HCC)    AVM repair    Past Surgical History:  Procedure Laterality Date  . CHOLECYSTECTOMY    . CRANIOTOMY     for AVM  . ORIF WRIST FRACTURE Left 02/16/2017   Procedure: OPEN REDUCTION INTERNAL FIXATION (ORIF) LEFT WRIST FRACTURE;  Surgeon: Sheral Apley, MD;  Location: [facility];  Service: Orthopedics;  Laterality: Left;    Review of Systems  Constitutional:  Negative.  Negative for activity change, fatigue and fever.  HENT: Negative.   Eyes: Negative.   Respiratory: Negative.  Negative for cough.   Cardiovascular: Negative.  Negative for chest pain.  Gastrointestinal: Negative.  Negative for abdominal pain.  Endocrine: Negative.   Genitourinary: Negative.  Negative for dysuria.  Musculoskeletal: Negative.   Skin: Negative.   Neurological: Negative.     Allergies as of 03/10/2017      Reactions   Bee Venom    Cant breath, swell up   Cherry Flavor Hives     dizziness   Citalopram Hydrobromide    Mood swings, gets "really really mean"   Tramadol    unknown      Medication List       Accurate as of 03/10/17  1:41 PM. Always use your most recent med list.          albuterol (2.5 MG/3ML) 0.083% nebulizer solution Commonly known as:  PROVENTIL Take 2.5 mg by nebulization every 6 (six) hours as needed for wheezing or shortness of breath.   albuterol 108 (90 Base) MCG/ACT inhaler Commonly known as:  PROVENTIL HFA;VENTOLIN HFA Inhale 2 puffs into the lungs every 6 (six) hours as needed for wheezing or shortness of breath.   ALPRAZolam 0.5 MG tablet Commonly known as:  XANAX Take 1 tablet (0.5 mg total) by mouth 2 (  two) times daily as needed.   aspirin 81 MG EC tablet Take 81 mg by mouth 2 (two) times daily.   Biotin 10 MG Tabs Take 1 tablet by mouth 2 (two) times daily.   fluticasone 50 MCG/ACT nasal spray Commonly known as:  FLONASE Place 1 spray into both nostrils 2 (two) times daily.   levothyroxine 125 MCG tablet Commonly known as:  SYNTHROID, LEVOTHROID Take 125 mcg by mouth daily.   losartan 50 MG tablet Commonly known as:  COZAAR Take 1 tablet (50 mg total) by mouth daily.   magnesium 30 MG tablet Take 30 mg by mouth 2 (two) times daily.   methocarbamol 500 MG tablet Commonly known as:  ROBAXIN Take 1 tablet (500 mg total) by mouth every 6 (six) hours as needed for muscle spasms.   montelukast 10 MG  tablet Commonly known as:  SINGULAIR Take 10 mg by mouth at bedtime.   multivitamin tablet Take 1 tablet by mouth daily.   omeprazole 20 MG capsule Commonly known as:  PRILOSEC TAKE 1 CAPSULE BY MOUTH EVERY DAY   ondansetron 4 MG tablet Commonly known as:  ZOFRAN Take 1 tablet (4 mg total) by mouth every 8 (eight) hours as needed for nausea or vomiting.   oxyCODONE-acetaminophen 5-325 MG tablet Commonly known as:  ROXICET Take 1-2 tablets by mouth every 4 (four) hours as needed for severe pain.   SAVELLA 100 MG Tabs tablet Generic drug:  Milnacipran HCl Take 100 mg by mouth 2 (two) times daily.   SUPER B COMPLEX/C PO Take 1 tablet by mouth 2 (two) times daily.   VITAMIN B-12 PO Take 1 tablet by mouth 2 (two) times daily.   Vitamin D3 2000 units Tabs Take 1 tablet by mouth 2 (two) times daily.          Objective:    BP (!) 155/106   Pulse 81   Temp 98 F (36.7 C) (Oral)   Ht 5' (1.524 m)   Wt 199 lb 12.8 oz (90.6 kg)   BMI 39.02 kg/m   Allergies  Allergen Reactions  . Bee Venom     Cant breath, swell up  . Cherry Flavor Hives      dizziness  . Citalopram Hydrobromide     Mood swings, gets "really really mean"  . Tramadol     unknown    Physical Exam  Constitutional: She is oriented to person, place, and time. She appears well-developed and well-nourished.  HENT:  Head: Normocephalic and atraumatic.  Eyes: Conjunctivae and EOM are normal. Pupils are equal, round, and reactive to light.  Cardiovascular: Normal rate, regular rhythm, normal heart sounds and intact distal pulses.   Pulmonary/Chest: Effort normal and breath sounds normal.  Abdominal: Soft. Bowel sounds are normal.  Neurological: She is alert and oriented to person, place, and time. She has normal reflexes.  Skin: Skin is warm and dry. No rash noted.  Psychiatric: She has a normal mood and affect. Her behavior is normal. Judgment and thought content normal.    Results for orders placed  or performed during the hospital encounter of 02/12/17  CBG monitoring, ED  Result Value Ref Range   Glucose-Capillary 114 (H) 65 - 99 mg/dL      Assessment & Plan:   1. Essential hypertension - losartan (COZAAR) 50 MG tablet; Take 1 tablet (50 mg total) by mouth daily.  Dispense: 90 tablet; Refill: 3  2. Fibromyalgia - methocarbamol (ROBAXIN) 500 MG tablet; Take 1 tablet (  500 mg total) by mouth every 6 (six) hours as needed for muscle spasms.  Dispense: 120 tablet; Refill: 0   Current Outpatient Prescriptions:  .  albuterol (PROVENTIL HFA;VENTOLIN HFA) 108 (90 Base) MCG/ACT inhaler, Inhale 2 puffs into the lungs every 6 (six) hours as needed for wheezing or shortness of breath., Disp: 1 Inhaler, Rfl: 6 .  albuterol (PROVENTIL) (2.5 MG/3ML) 0.083% nebulizer solution, Take 2.5 mg by nebulization every 6 (six) hours as needed for wheezing or shortness of breath., Disp: , Rfl:  .  ALPRAZolam (XANAX) 0.5 MG tablet, Take 1 tablet (0.5 mg total) by mouth 2 (two) times daily as needed. (Patient taking differently: Take 0.5 mg by mouth 2 (two) times daily as needed for anxiety (panic attacks). ), Disp: 60 tablet, Rfl: 5 .  aspirin 81 MG EC tablet, Take 81 mg by mouth 2 (two) times daily. , Disp: , Rfl:  .  Biotin 10 MG TABS, Take 1 tablet by mouth 2 (two) times daily. , Disp: , Rfl:  .  Cholecalciferol (VITAMIN D3) 2000 UNITS TABS, Take 1 tablet by mouth 2 (two) times daily., Disp: , Rfl:  .  Cyanocobalamin (VITAMIN B-12 PO), Take 1 tablet by mouth 2 (two) times daily., Disp: , Rfl:  .  fluticasone (FLONASE) 50 MCG/ACT nasal spray, Place 1 spray into both nostrils 2 (two) times daily. , Disp: , Rfl:  .  levothyroxine (SYNTHROID, LEVOTHROID) 125 MCG tablet, Take 125 mcg by mouth daily., Disp: , Rfl:  .  magnesium 30 MG tablet, Take 30 mg by mouth 2 (two) times daily., Disp: , Rfl:  .  methocarbamol (ROBAXIN) 500 MG tablet, Take 1 tablet (500 mg total) by mouth every 6 (six) hours as needed for  muscle spasms., Disp: 120 tablet, Rfl: 0 .  montelukast (SINGULAIR) 10 MG tablet, Take 10 mg by mouth at bedtime., Disp: , Rfl:  .  Multiple Vitamin (MULTIVITAMIN) tablet, Take 1 tablet by mouth daily., Disp: , Rfl:  .  omeprazole (PRILOSEC) 20 MG capsule, TAKE 1 CAPSULE BY MOUTH EVERY DAY, Disp: 30 capsule, Rfl: 5 .  ondansetron (ZOFRAN) 4 MG tablet, Take 1 tablet (4 mg total) by mouth every 8 (eight) hours as needed for nausea or vomiting., Disp: 40 tablet, Rfl: 0 .  oxyCODONE-acetaminophen (ROXICET) 5-325 MG tablet, Take 1-2 tablets by mouth every 4 (four) hours as needed for severe pain., Disp: 40 tablet, Rfl: 0 .  SAVELLA 100 MG TABS tablet, Take 100 mg by mouth 2 (two) times daily., Disp: , Rfl: 11 .  SUPER B COMPLEX/C PO, Take 1 tablet by mouth 2 (two) times daily., Disp: , Rfl:  .  losartan (COZAAR) 50 MG tablet, Take 1 tablet (50 mg total) by mouth daily., Disp: 90 tablet, Rfl: 3  Continue all other maintenance medications as listed above.  Follow up plan: Return in about 4 weeks (around 04/07/2017) for recheck.  Educational handout given for DASH diet  Remus LofflerAngel S. Nayana Lenig PA-C [facility] [address]  [place], [place] [id number] [phone]   03/10/2017, 1:41 PM

## 2017-03-10 NOTE — Patient Instructions (Signed)
DASH Eating Plan DASH stands for "Dietary Approaches to Stop Hypertension." The DASH eating plan is a healthy eating plan that has been shown to reduce high blood pressure (hypertension). It may also reduce your risk for type 2 diabetes, heart disease, and stroke. The DASH eating plan may also help with weight loss. What are tips for following this plan? General guidelines  Avoid eating more than 2,300 mg (milligrams) of salt (sodium) a day. If you have hypertension, you may need to reduce your sodium intake to 1,500 mg a day.  Limit alcohol intake to no more than 1 drink a day for nonpregnant women and 2 drinks a day for men. One drink equals 12 oz of beer, 5 oz of wine, or 1 oz of hard liquor.  Work with your health care provider to maintain a healthy body weight or to lose weight. Ask what an ideal weight is for you.  Get at least 30 minutes of exercise that causes your heart to beat faster (aerobic exercise) most days of the week. Activities may include walking, swimming, or biking.  Work with your health care provider or diet and nutrition specialist (dietitian) to adjust your eating plan to your individual calorie needs. Reading food labels  Check food labels for the amount of sodium per serving. Choose foods with less than 5 percent of the Daily Value of sodium. Generally, foods with less than 300 mg of sodium per serving fit into this eating plan.  To find whole grains, look for the word "whole" as the first word in the ingredient list. Shopping  Buy products labeled as "low-sodium" or "no salt added."  Buy fresh foods. Avoid canned foods and premade or frozen meals. Cooking  Avoid adding salt when cooking. Use salt-free seasonings or herbs instead of table salt or sea salt. Check with your health care provider or pharmacist before using salt substitutes.  Do not fry foods. Cook foods using healthy methods such as baking, boiling, grilling, and broiling instead.  Cook with  heart-healthy oils, such as olive, canola, soybean, or sunflower oil. Meal planning   Eat a balanced diet that includes: ? 5 or more servings of fruits and vegetables each day. At each meal, try to fill half of your plate with fruits and vegetables. ? Up to 6-8 servings of whole grains each day. ? Less than 6 oz of lean meat, poultry, or fish each day. A 3-oz serving of meat is about the same size as a deck of cards. One egg equals 1 oz. ? 2 servings of low-fat dairy each day. ? A serving of nuts, seeds, or beans 5 times each week. ? Heart-healthy fats. Healthy fats called Omega-3 fatty acids are found in foods such as flaxseeds and coldwater fish, like sardines, salmon, and mackerel.  Limit how much you eat of the following: ? Canned or prepackaged foods. ? Food that is high in trans fat, such as fried foods. ? Food that is high in saturated fat, such as fatty meat. ? Sweets, desserts, sugary drinks, and other foods with added sugar. ? Full-fat dairy products.  Do not salt foods before eating.  Try to eat at least 2 vegetarian meals each week.  Eat more home-cooked food and less restaurant, buffet, and fast food.  When eating at a restaurant, ask that your food be prepared with less salt or no salt, if possible. What foods are recommended? The items listed may not be a complete list. Talk with your dietitian about what   dietary choices are best for you. Grains Whole-grain or whole-wheat bread. Whole-grain or whole-wheat pasta. Brown rice. Oatmeal. Quinoa. Bulgur. Whole-grain and low-sodium cereals. Pita bread. Low-fat, low-sodium crackers. Whole-wheat flour tortillas. Vegetables Fresh or frozen vegetables (raw, steamed, roasted, or grilled). Low-sodium or reduced-sodium tomato and vegetable juice. Low-sodium or reduced-sodium tomato sauce and tomato paste. Low-sodium or reduced-sodium canned vegetables. Fruits All fresh, dried, or frozen fruit. Canned fruit in natural juice (without  added sugar). Meat and other protein foods Skinless chicken or turkey. Ground chicken or turkey. Pork with fat trimmed off. Fish and seafood. Egg whites. Dried beans, peas, or lentils. Unsalted nuts, nut butters, and seeds. Unsalted canned beans. Lean cuts of beef with fat trimmed off. Low-sodium, lean deli meat. Dairy Low-fat (1%) or fat-free (skim) milk. Fat-free, low-fat, or reduced-fat cheeses. Nonfat, low-sodium ricotta or cottage cheese. Low-fat or nonfat yogurt. Low-fat, low-sodium cheese. Fats and oils Soft margarine without trans fats. Vegetable oil. Low-fat, reduced-fat, or light mayonnaise and salad dressings (reduced-sodium). Canola, safflower, olive, soybean, and sunflower oils. Avocado. Seasoning and other foods Herbs. Spices. Seasoning mixes without salt. Unsalted popcorn and pretzels. Fat-free sweets. What foods are not recommended? The items listed may not be a complete list. Talk with your dietitian about what dietary choices are best for you. Grains Baked goods made with fat, such as croissants, muffins, or some breads. Dry pasta or rice meal packs. Vegetables Creamed or fried vegetables. Vegetables in a cheese sauce. Regular canned vegetables (not low-sodium or reduced-sodium). Regular canned tomato sauce and paste (not low-sodium or reduced-sodium). Regular tomato and vegetable juice (not low-sodium or reduced-sodium). Pickles. Olives. Fruits Canned fruit in a light or heavy syrup. Fried fruit. Fruit in cream or butter sauce. Meat and other protein foods Fatty cuts of meat. Ribs. Fried meat. Bacon. Sausage. Bologna and other processed lunch meats. Salami. Fatback. Hotdogs. Bratwurst. Salted nuts and seeds. Canned beans with added salt. Canned or smoked fish. Whole eggs or egg yolks. Chicken or turkey with skin. Dairy Whole or 2% milk, cream, and half-and-half. Whole or full-fat cream cheese. Whole-fat or sweetened yogurt. Full-fat cheese. Nondairy creamers. Whipped toppings.  Processed cheese and cheese spreads. Fats and oils Butter. Stick margarine. Lard. Shortening. Ghee. Bacon fat. Tropical oils, such as coconut, palm kernel, or palm oil. Seasoning and other foods Salted popcorn and pretzels. Onion salt, garlic salt, seasoned salt, table salt, and sea salt. Worcestershire sauce. Tartar sauce. Barbecue sauce. Teriyaki sauce. Soy sauce, including reduced-sodium. Steak sauce. Canned and packaged gravies. Fish sauce. Oyster sauce. Cocktail sauce. Horseradish that you find on the shelf. Ketchup. Mustard. Meat flavorings and tenderizers. Bouillon cubes. Hot sauce and Tabasco sauce. Premade or packaged marinades. Premade or packaged taco seasonings. Relishes. Regular salad dressings. Where to find more information:  National Heart, Lung, and Blood Institute: www.nhlbi.nih.gov  American Heart Association: www.heart.org Summary  The DASH eating plan is a healthy eating plan that has been shown to reduce high blood pressure (hypertension). It may also reduce your risk for type 2 diabetes, heart disease, and stroke.  With the DASH eating plan, you should limit salt (sodium) intake to 2,300 mg a day. If you have hypertension, you may need to reduce your sodium intake to 1,500 mg a day.  When on the DASH eating plan, aim to eat more fresh fruits and vegetables, whole grains, lean proteins, low-fat dairy, and heart-healthy fats.  Work with your health care provider or diet and nutrition specialist (dietitian) to adjust your eating plan to your individual   calorie needs. This information is not intended to replace advice given to you by your health care provider. Make sure you discuss any questions you have with your health care provider. Document Released: 11/19/2011 Document Revised: 11/23/2016 Document Reviewed: 11/23/2016 [employer]vier Interactive Patient Education  2017 [employer]  

## 2017-03-15 ENCOUNTER — Other Ambulatory Visit: Payer: Self-pay | Admitting: Physician Assistant

## 2017-03-22 DIAGNOSIS — S52502D Unspecified fracture of the lower end of left radius, subsequent encounter for closed fracture with routine healing: Secondary | ICD-10-CM | POA: Diagnosis not present

## 2017-04-05 ENCOUNTER — Ambulatory Visit (INDEPENDENT_AMBULATORY_CARE_PROVIDER_SITE_OTHER): Payer: PPO | Admitting: Physician Assistant

## 2017-04-05 ENCOUNTER — Encounter: Payer: Self-pay | Admitting: Physician Assistant

## 2017-04-05 VITALS — BP 144/88 | HR 70 | Temp 97.6°F | Ht 60.0 in | Wt 197.6 lb

## 2017-04-05 DIAGNOSIS — R002 Palpitations: Secondary | ICD-10-CM

## 2017-04-05 DIAGNOSIS — K219 Gastro-esophageal reflux disease without esophagitis: Secondary | ICD-10-CM

## 2017-04-05 DIAGNOSIS — I10 Essential (primary) hypertension: Secondary | ICD-10-CM | POA: Diagnosis not present

## 2017-04-05 DIAGNOSIS — E039 Hypothyroidism, unspecified: Secondary | ICD-10-CM

## 2017-04-05 MED ORDER — LEVOTHYROXINE SODIUM 125 MCG PO TABS: 125.0000 ug | ORAL_TABLET | Freq: Every day | ORAL | 3 refills | Status: DC

## 2017-04-05 MED ORDER — FLUCONAZOLE 150 MG PO TABS: 150.0000 mg | ORAL_TABLET | Freq: Once | ORAL | 0 refills | Status: AC

## 2017-04-05 NOTE — Patient Instructions (Signed)
DASH Eating Plan DASH stands for "Dietary Approaches to Stop Hypertension." The DASH eating plan is a healthy eating plan that has been shown to reduce high blood pressure (hypertension). It may also reduce your risk for type 2 diabetes, heart disease, and stroke. The DASH eating plan may also help with weight loss. What are tips for following this plan? General guidelines  Avoid eating more than 2,300 mg (milligrams) of salt (sodium) a day. If you have hypertension, you may need to reduce your sodium intake to 1,500 mg a day.  Limit alcohol intake to no more than 1 drink a day for nonpregnant women and 2 drinks a day for men. One drink equals 12 oz of beer, 5 oz of wine, or 1 oz of hard liquor.  Work with your health care provider to maintain a healthy body weight or to lose weight. Ask what an ideal weight is for you.  Get at least 30 minutes of exercise that causes your heart to beat faster (aerobic exercise) most days of the week. Activities may include walking, swimming, or biking.  Work with your health care provider or diet and nutrition specialist (dietitian) to adjust your eating plan to your individual calorie needs. Reading food labels  Check food labels for the amount of sodium per serving. Choose foods with less than 5 percent of the Daily Value of sodium. Generally, foods with less than 300 mg of sodium per serving fit into this eating plan.  To find whole grains, look for the word "whole" as the first word in the ingredient list. Shopping  Buy products labeled as "low-sodium" or "no salt added."  Buy fresh foods. Avoid canned foods and premade or frozen meals. Cooking  Avoid adding salt when cooking. Use salt-free seasonings or herbs instead of table salt or sea salt. Check with your health care provider or pharmacist before using salt substitutes.  Do not fry foods. Cook foods using healthy methods such as baking, boiling, grilling, and broiling instead.  Cook with  heart-healthy oils, such as olive, canola, soybean, or sunflower oil. Meal planning   Eat a balanced diet that includes: ? 5 or more servings of fruits and vegetables each day. At each meal, try to fill half of your plate with fruits and vegetables. ? Up to 6-8 servings of whole grains each day. ? Less than 6 oz of lean meat, poultry, or fish each day. A 3-oz serving of meat is about the same size as a deck of cards. One egg equals 1 oz. ? 2 servings of low-fat dairy each day. ? A serving of nuts, seeds, or beans 5 times each week. ? Heart-healthy fats. Healthy fats called Omega-3 fatty acids are found in foods such as flaxseeds and coldwater fish, like sardines, salmon, and mackerel.  Limit how much you eat of the following: ? Canned or prepackaged foods. ? Food that is high in trans fat, such as fried foods. ? Food that is high in saturated fat, such as fatty meat. ? Sweets, desserts, sugary drinks, and other foods with added sugar. ? Full-fat dairy products.  Do not salt foods before eating.  Try to eat at least 2 vegetarian meals each week.  Eat more home-cooked food and less restaurant, buffet, and fast food.  When eating at a restaurant, ask that your food be prepared with less salt or no salt, if possible. What foods are recommended? The items listed may not be a complete list. Talk with your dietitian about what   dietary choices are best for you. Grains Whole-grain or whole-wheat bread. Whole-grain or whole-wheat pasta. Brown rice. Oatmeal. Quinoa. Bulgur. Whole-grain and low-sodium cereals. Pita bread. Low-fat, low-sodium crackers. Whole-wheat flour tortillas. Vegetables Fresh or frozen vegetables (raw, steamed, roasted, or grilled). Low-sodium or reduced-sodium tomato and vegetable juice. Low-sodium or reduced-sodium tomato sauce and tomato paste. Low-sodium or reduced-sodium canned vegetables. Fruits All fresh, dried, or frozen fruit. Canned fruit in natural juice (without  added sugar). Meat and other protein foods Skinless chicken or turkey. Ground chicken or turkey. Pork with fat trimmed off. Fish and seafood. Egg whites. Dried beans, peas, or lentils. Unsalted nuts, nut butters, and seeds. Unsalted canned beans. Lean cuts of beef with fat trimmed off. Low-sodium, lean deli meat. Dairy Low-fat (1%) or fat-free (skim) milk. Fat-free, low-fat, or reduced-fat cheeses. Nonfat, low-sodium ricotta or cottage cheese. Low-fat or nonfat yogurt. Low-fat, low-sodium cheese. Fats and oils Soft margarine without trans fats. Vegetable oil. Low-fat, reduced-fat, or light mayonnaise and salad dressings (reduced-sodium). Canola, safflower, olive, soybean, and sunflower oils. Avocado. Seasoning and other foods Herbs. Spices. Seasoning mixes without salt. Unsalted popcorn and pretzels. Fat-free sweets. What foods are not recommended? The items listed may not be a complete list. Talk with your dietitian about what dietary choices are best for you. Grains Baked goods made with fat, such as croissants, muffins, or some breads. Dry pasta or rice meal packs. Vegetables Creamed or fried vegetables. Vegetables in a cheese sauce. Regular canned vegetables (not low-sodium or reduced-sodium). Regular canned tomato sauce and paste (not low-sodium or reduced-sodium). Regular tomato and vegetable juice (not low-sodium or reduced-sodium). Pickles. Olives. Fruits Canned fruit in a light or heavy syrup. Fried fruit. Fruit in cream or butter sauce. Meat and other protein foods Fatty cuts of meat. Ribs. Fried meat. Bacon. Sausage. Bologna and other processed lunch meats. Salami. Fatback. Hotdogs. Bratwurst. Salted nuts and seeds. Canned beans with added salt. Canned or smoked fish. Whole eggs or egg yolks. Chicken or turkey with skin. Dairy Whole or 2% milk, cream, and half-and-half. Whole or full-fat cream cheese. Whole-fat or sweetened yogurt. Full-fat cheese. Nondairy creamers. Whipped toppings.  Processed cheese and cheese spreads. Fats and oils Butter. Stick margarine. Lard. Shortening. Ghee. Bacon fat. Tropical oils, such as coconut, palm kernel, or palm oil. Seasoning and other foods Salted popcorn and pretzels. Onion salt, garlic salt, seasoned salt, table salt, and sea salt. Worcestershire sauce. Tartar sauce. Barbecue sauce. Teriyaki sauce. Soy sauce, including reduced-sodium. Steak sauce. Canned and packaged gravies. Fish sauce. Oyster sauce. Cocktail sauce. Horseradish that you find on the shelf. Ketchup. Mustard. Meat flavorings and tenderizers. Bouillon cubes. Hot sauce and Tabasco sauce. Premade or packaged marinades. Premade or packaged taco seasonings. Relishes. Regular salad dressings. Where to find more information:  National Heart, Lung, and Blood Institute: www.nhlbi.nih.gov  American Heart Association: www.heart.org Summary  The DASH eating plan is a healthy eating plan that has been shown to reduce high blood pressure (hypertension). It may also reduce your risk for type 2 diabetes, heart disease, and stroke.  With the DASH eating plan, you should limit salt (sodium) intake to 2,300 mg a day. If you have hypertension, you may need to reduce your sodium intake to 1,500 mg a day.  When on the DASH eating plan, aim to eat more fresh fruits and vegetables, whole grains, lean proteins, low-fat dairy, and heart-healthy fats.  Work with your health care provider or diet and nutrition specialist (dietitian) to adjust your eating plan to your individual   calorie needs. This information is not intended to replace advice given to you by your health care provider. Make sure you discuss any questions you have with your health care provider. Document Released: 11/19/2011 Document Revised: 11/23/2016 Document Reviewed: 11/23/2016 [employer]vier Interactive Patient Education  2017 [employer]  

## 2017-04-05 NOTE — Progress Notes (Signed)
BP (!) 144/88   Pulse 70   Temp 97.6 F (36.4 C) (Oral)   Ht 5' (1.524 m)   Wt 197 lb 9.6 oz (89.6 kg)   BMI 38.59 kg/m    Subjective:    Patient ID: Alicia Russo, female    DOB: 1956-10-28, 61 y.o.   MRN: [id number]  HPI: Alicia Russo is a 61 y.o. female presenting on 04/05/2017 for Follow-up (3 month )  This patient comes in for periodic recheck on medications and conditions including Hypertension, GERD, hypothyroidism. Overall the patient has been doing well. She is tolerating the blood pressure medication quite well. Her blood pressure log is very good. I do think there is some anxiety here in the office that brings her blood pressure up. I would like for her to continue the medications at the level she is taking now. She will continue to monitor her blood pressure. I have asked her if she has any type of headache or anything going on to let us know. She experiences any weakness or dizziness to let us know. All of her medications are reviewed. Refills will be sent as needed. Plan to recheck her in 3 months..   All medications are reviewed today. There are no reports of any problems with the medications. All of the medical conditions are reviewed and updated.  Lab work is reviewed and will be ordered as medically necessary. There are no new problems reported with today's visit.   Relevant past medical, surgical, family and social history reviewed and updated as indicated. Allergies and medications reviewed and updated.  Past Medical History:  Diagnosis Date  . Anxiety   . Asthma   . AVM (arteriovenous malformation)    Intracranial with seizures  . Closed fracture of left distal radius   . Fibromyalgia   . GERD (gastroesophageal reflux disease)   . Hyperlipidemia   . Hypothyroidism   . Seizures (HCC)    AVM repair    Past Surgical History:  Procedure Laterality Date  . CHOLECYSTECTOMY    . CRANIOTOMY     for AVM  . ORIF WRIST FRACTURE Left 02/16/2017   Procedure: OPEN  REDUCTION INTERNAL FIXATION (ORIF) LEFT WRIST FRACTURE;  Surgeon: Sheral Apley, MD;  Location: [facility];  Service: Orthopedics;  Laterality: Left;    Review of Systems  Constitutional: Negative.  Negative for activity change, fatigue and fever.  HENT: Negative.   Eyes: Negative.   Respiratory: Negative.  Negative for cough.   Cardiovascular: Negative.  Negative for chest pain.  Gastrointestinal: Negative.  Negative for abdominal pain.  Endocrine: Negative.   Genitourinary: Negative.  Negative for dysuria.  Musculoskeletal: Negative.   Skin: Negative.   Neurological: Negative.     Allergies as of 04/05/2017      Reactions   Bee Venom    Cant breath, swell up   Cherry Flavor Hives     dizziness   Citalopram Hydrobromide    Mood swings, gets "really really mean"   Tramadol    unknown      Medication List       Accurate as of 04/05/17 12:09 PM. Always use your most recent med list.          albuterol (2.5 MG/3ML) 0.083% nebulizer solution Commonly known as:  PROVENTIL Take 2.5 mg by nebulization every 6 (six) hours as needed for wheezing or shortness of breath.   albuterol 108 (90 Base) MCG/ACT inhaler Commonly known as:  PROVENTIL  HFA;VENTOLIN HFA Inhale 2 puffs into the lungs every 6 (six) hours as needed for wheezing or shortness of breath.   ALPRAZolam 0.5 MG tablet Commonly known as:  XANAX Take 1 tablet (0.5 mg total) by mouth 2 (two) times daily as needed.   aspirin 81 MG EC tablet Take 81 mg by mouth 2 (two) times daily.   Biotin 10 MG Tabs Take 1 tablet by mouth 2 (two) times daily.   fluconazole 150 MG tablet Commonly known as:  DIFLUCAN Take 1 tablet (150 mg total) by mouth once.   fluticasone 50 MCG/ACT nasal spray Commonly known as:  FLONASE Place 1 spray into both nostrils 2 (two) times daily.   levothyroxine 125 MCG tablet Commonly known as:  SYNTHROID, LEVOTHROID Take 1 tablet (125 mcg total) by mouth daily.   losartan  50 MG tablet Commonly known as:  COZAAR Take 1 tablet (50 mg total) by mouth daily.   magnesium 30 MG tablet Take 30 mg by mouth 2 (two) times daily.   methocarbamol 500 MG tablet Commonly known as:  ROBAXIN Take 1 tablet (500 mg total) by mouth every 6 (six) hours as needed for muscle spasms.   montelukast 10 MG tablet Commonly known as:  SINGULAIR TAKE 1 TABLET BY MOUTH EVERY EVENING   multivitamin tablet Take 1 tablet by mouth daily.   omeprazole 20 MG capsule Commonly known as:  PRILOSEC TAKE 1 CAPSULE BY MOUTH EVERY DAY   ondansetron 4 MG tablet Commonly known as:  ZOFRAN Take 1 tablet (4 mg total) by mouth every 8 (eight) hours as needed for nausea or vomiting.   oxyCODONE-acetaminophen 5-325 MG tablet Commonly known as:  ROXICET Take 1-2 tablets by mouth every 4 (four) hours as needed for severe pain.   SAVELLA 100 MG Tabs tablet Generic drug:  Milnacipran HCl Take 100 mg by mouth 2 (two) times daily.   SUPER B COMPLEX/C PO Take 1 tablet by mouth 2 (two) times daily.   VITAMIN B-12 PO Take 1 tablet by mouth 2 (two) times daily.   Vitamin D3 2000 units Tabs Take 1 tablet by mouth 2 (two) times daily.          Objective:    BP (!) 144/88   Pulse 70   Temp 97.6 F (36.4 C) (Oral)   Ht 5' (1.524 m)   Wt 197 lb 9.6 oz (89.6 kg)   BMI 38.59 kg/m   Allergies  Allergen Reactions  . Bee Venom     Cant breath, swell up  . Cherry Flavor Hives      dizziness  . Citalopram Hydrobromide     Mood swings, gets "really really mean"  . Tramadol     unknown    Physical Exam  Constitutional: She is oriented to person, place, and time. She appears well-developed and well-nourished.  HENT:  Head: Normocephalic and atraumatic.  Right Ear: Tympanic membrane, external ear and ear canal normal.  Left Ear: Tympanic membrane, external ear and ear canal normal.  Nose: Nose normal. No rhinorrhea.  Mouth/Throat: Oropharynx is clear and moist and mucous membranes  are normal. No oropharyngeal exudate or posterior oropharyngeal erythema.  Eyes: Conjunctivae and EOM are normal. Pupils are equal, round, and reactive to light.  Neck: Normal range of motion. Neck supple.  Cardiovascular: Normal rate, regular rhythm, normal heart sounds and intact distal pulses.   Pulmonary/Chest: Effort normal and breath sounds normal.  Abdominal: Soft. Bowel sounds are normal.  Neurological: She is alert  and oriented to person, place, and time. She has normal reflexes.  Skin: Skin is warm and dry. No rash noted.  Psychiatric: She has a normal mood and affect. Her behavior is normal. Judgment and thought content normal.    Results for orders placed or performed during the hospital encounter of 02/12/17  CBG monitoring, ED  Result Value Ref Range   Glucose-Capillary 114 (H) 65 - 99 mg/dL      Assessment & Plan:   1. Essential hypertension  2. Gastroesophageal reflux disease without esophagitis  3. Hypothyroidism, unspecified type - levothyroxine (SYNTHROID, LEVOTHROID) 125 MCG tablet; Take 1 tablet (125 mcg total) by mouth daily.  Dispense: 90 tablet; Refill: 3  4. PALPITATIONS, RECURRENT   Current Outpatient Prescriptions:  .  albuterol (PROVENTIL HFA;VENTOLIN HFA) 108 (90 Base) MCG/ACT inhaler, Inhale 2 puffs into the lungs every 6 (six) hours as needed for wheezing or shortness of breath., Disp: 1 Inhaler, Rfl: 6 .  albuterol (PROVENTIL) (2.5 MG/3ML) 0.083% nebulizer solution, Take 2.5 mg by nebulization every 6 (six) hours as needed for wheezing or shortness of breath., Disp: , Rfl:  .  ALPRAZolam (XANAX) 0.5 MG tablet, Take 1 tablet (0.5 mg total) by mouth 2 (two) times daily as needed. (Patient taking differently: Take 0.5 mg by mouth 2 (two) times daily as needed for anxiety (panic attacks). ), Disp: 60 tablet, Rfl: 5 .  aspirin 81 MG EC tablet, Take 81 mg by mouth 2 (two) times daily. , Disp: , Rfl:  .  Biotin 10 MG TABS, Take 1 tablet by mouth 2 (two)  times daily. , Disp: , Rfl:  .  Cholecalciferol (VITAMIN D3) 2000 UNITS TABS, Take 1 tablet by mouth 2 (two) times daily., Disp: , Rfl:  .  Cyanocobalamin (VITAMIN B-12 PO), Take 1 tablet by mouth 2 (two) times daily., Disp: , Rfl:  .  fluticasone (FLONASE) 50 MCG/ACT nasal spray, Place 1 spray into both nostrils 2 (two) times daily. , Disp: , Rfl:  .  levothyroxine (SYNTHROID, LEVOTHROID) 125 MCG tablet, Take 1 tablet (125 mcg total) by mouth daily., Disp: 90 tablet, Rfl: 3 .  losartan (COZAAR) 50 MG tablet, Take 1 tablet (50 mg total) by mouth daily., Disp: 90 tablet, Rfl: 3 .  magnesium 30 MG tablet, Take 30 mg by mouth 2 (two) times daily., Disp: , Rfl:  .  methocarbamol (ROBAXIN) 500 MG tablet, Take 1 tablet (500 mg total) by mouth every 6 (six) hours as needed for muscle spasms., Disp: 120 tablet, Rfl: 0 .  montelukast (SINGULAIR) 10 MG tablet, TAKE 1 TABLET BY MOUTH EVERY EVENING, Disp: 270 tablet, Rfl: 0 .  Multiple Vitamin (MULTIVITAMIN) tablet, Take 1 tablet by mouth daily., Disp: , Rfl:  .  omeprazole (PRILOSEC) 20 MG capsule, TAKE 1 CAPSULE BY MOUTH EVERY DAY, Disp: 30 capsule, Rfl: 5 .  ondansetron (ZOFRAN) 4 MG tablet, Take 1 tablet (4 mg total) by mouth every 8 (eight) hours as needed for nausea or vomiting., Disp: 40 tablet, Rfl: 0 .  oxyCODONE-acetaminophen (ROXICET) 5-325 MG tablet, Take 1-2 tablets by mouth every 4 (four) hours as needed for severe pain., Disp: 40 tablet, Rfl: 0 .  SAVELLA 100 MG TABS tablet, Take 100 mg by mouth 2 (two) times daily., Disp: , Rfl: 11 .  SUPER B COMPLEX/C PO, Take 1 tablet by mouth 2 (two) times daily., Disp: , Rfl:  .  fluconazole (DIFLUCAN) 150 MG tablet, Take 1 tablet (150 mg total) by mouth once., Disp:  1 tablet, Rfl: 0  Continue all other maintenance medications as listed above.  Follow up plan: Return in about 3 months (around 07/05/2017) for recheck.  Educational handout given for DASH diet  Remus Loffler PA-C [facility] [address]  [place], [place] [address] [phone]   04/05/2017, 12:09 PM

## 2017-04-17 ENCOUNTER — Ambulatory Visit (INDEPENDENT_AMBULATORY_CARE_PROVIDER_SITE_OTHER): Payer: PPO | Admitting: Family

## 2017-04-17 ENCOUNTER — Other Ambulatory Visit: Payer: Self-pay | Admitting: Physician Assistant

## 2017-04-17 ENCOUNTER — Encounter: Payer: Self-pay | Admitting: Family

## 2017-04-17 VITALS — BP 130/85 | HR 86 | Temp 97.0°F | Ht 61.0 in | Wt 198.0 lb

## 2017-04-17 DIAGNOSIS — M797 Fibromyalgia: Secondary | ICD-10-CM

## 2017-04-17 DIAGNOSIS — J069 Acute upper respiratory infection, unspecified: Secondary | ICD-10-CM | POA: Diagnosis not present

## 2017-04-17 DIAGNOSIS — J4520 Mild intermittent asthma, uncomplicated: Secondary | ICD-10-CM

## 2017-04-17 MED ORDER — FLUTICASONE PROPIONATE 50 MCG/ACT NA SUSP: 2.0000 | Freq: Two times a day (BID) | NASAL | 5 refills | Status: DC

## 2017-04-17 MED ORDER — BUDESONIDE-FORMOTEROL FUMARATE 80-4.5 MCG/ACT IN AERO: 2.0000 | INHALATION_SPRAY | Freq: Two times a day (BID) | RESPIRATORY_TRACT | 3 refills | Status: DC

## 2017-04-17 MED ORDER — BENZONATATE 200 MG PO CAPS: 200.0000 mg | ORAL_CAPSULE | Freq: Three times a day (TID) | ORAL | 1 refills | Status: DC | PRN

## 2017-04-17 NOTE — Patient Instructions (Signed)

## 2017-04-17 NOTE — Progress Notes (Signed)
Subjective:    Patient ID: Alicia Russo, female    DOB: [birth year], 61 y.o.   MRN: [id number]  Cough  This is a new problem. The current episode started today. The problem has been unchanged. The problem occurs every few minutes. The cough is productive of sputum. Associated symptoms include chills, ear congestion, ear pain, nasal congestion, a sore throat, shortness of breath and wheezing. Pertinent negatives include no fever, headaches or rhinorrhea. The symptoms are aggravated by lying down. She has tried OTC cough suppressant, rest and prescription cough suppressant for the symptoms. The treatment provided mild relief. Her past medical history is significant for asthma.  Shortness of Breath  Associated symptoms include ear pain, a sore throat and wheezing. Pertinent negatives include no fever, headaches or rhinorrhea. Her past medical history is significant for asthma.      Review of Systems  Constitutional: Positive for chills. Negative for fever.  HENT: Positive for ear pain and sore throat. Negative for rhinorrhea.   Respiratory: Positive for cough, shortness of breath and wheezing.   Neurological: Negative for headaches.  All other systems reviewed and are negative.      Objective:   Physical Exam  Constitutional: She is oriented to person, place, and time. She appears well-developed and well-nourished. No distress.  HENT:  Head: Normocephalic and atraumatic.  Right Ear: External ear normal.  Left Ear: External ear normal.  Nose: Mucosal edema and rhinorrhea present.  Mouth/Throat: Posterior oropharyngeal erythema present.  Eyes: Pupils are equal, round, and reactive to light.  Neck: Normal range of motion. Neck supple. No thyromegaly present.  Cardiovascular: Normal rate, regular rhythm, normal heart sounds and intact distal pulses.   No murmur heard. Pulmonary/Chest: Effort normal and breath sounds normal. No respiratory distress. She has no wheezes.  Abdominal: Soft.  Bowel sounds are normal. She exhibits no distension. There is no tenderness.  Musculoskeletal: Normal range of motion. She exhibits no edema or tenderness.  Neurological: She is alert and oriented to person, place, and time. She has normal reflexes. No cranial nerve deficit.  Skin: Skin is warm and dry.  Psychiatric: She has a normal mood and affect. Her behavior is normal. Judgment and thought content normal.  Vitals reviewed.     BP 130/85   Pulse 86   Temp 97 F (36.1 C) (Oral)   Ht 5\' 1"  (1.549 m)   Wt 198 lb (89.8 kg)   SpO2 100%   BMI 37.41 kg/m      Assessment & Plan:  1. Viral upper respiratory tract infection - Take meds as prescribed - Use a cool mist humidifier  -Use saline nose sprays frequently -Saline irrigations of the nose can be very helpful if done frequently.  * 4X daily for 1 week*  * Use of a nettie pot can be helpful with this. Follow directions with this* -Force fluids -For any cough or congestion  Use plain Mucinex- regular strength or max strength is fine   * Children- consult with Pharmacist for dosing -For fever or aces or pains- take tylenol or ibuprofen appropriate for age and weight.  * for fevers greater than 101 orally you may alternate ibuprofen and tylenol every  3 hours. -Throat lozenges if help - fluticasone (FLONASE) 50 MCG/ACT nasal spray; Place 2 sprays into both nostrils 2 (two) times daily.  Dispense: 16 g; Refill: 5 - budesonide-formoterol (SYMBICORT) 80-4.5 MCG/ACT inhaler; Inhale 2 puffs into the lungs 2 (two) times daily.  Dispense: 1 Inhaler; Refill:  3  2. Mild intermittent asthma, unspecified whether complicated - fluticasone (FLONASE) 50 MCG/ACT nasal spray; Place 2 sprays into both nostrils 2 (two) times daily.  Dispense: 16 g; Refill: 5 - benzonatate (TESSALON) 200 MG capsule; Take 1 capsule (200 mg total) by mouth 3 (three) times daily as needed.  Dispense: 30 capsule; Refill: 1 - budesonide-formoterol (SYMBICORT) 80-4.5  MCG/ACT inhaler; Inhale 2 puffs into the lungs 2 (two) times daily.  Dispense: 1 Inhaler; Refill: 3   Continue Singulair and Flonase PT started on Symbicort today- Pt complaining of tightness and SOB Continue albuterol as needed  Avoid allergens when possible RTO prn and keep follow up with PCP   Jannifer Rodneyhristy Kiing Deakin, FNP

## 2017-04-19 DIAGNOSIS — S52502D Unspecified fracture of the lower end of left radius, subsequent encounter for closed fracture with routine healing: Secondary | ICD-10-CM | POA: Diagnosis not present

## 2017-04-21 ENCOUNTER — Ambulatory Visit (INDEPENDENT_AMBULATORY_CARE_PROVIDER_SITE_OTHER): Payer: PPO | Admitting: Family Medicine

## 2017-04-21 ENCOUNTER — Encounter: Payer: Self-pay | Admitting: Family Medicine

## 2017-04-21 VITALS — BP 129/76 | HR 93 | Temp 98.0°F | Ht 61.0 in | Wt 197.0 lb

## 2017-04-21 DIAGNOSIS — J4541 Moderate persistent asthma with (acute) exacerbation: Secondary | ICD-10-CM | POA: Diagnosis not present

## 2017-04-21 MED ORDER — METHYLPREDNISOLONE ACETATE 80 MG/ML IJ SUSP
80.0000 mg | Freq: Once | INTRAMUSCULAR | Status: AC
  Administered 2017-04-21: 80 mg via INTRAMUSCULAR

## 2017-04-21 NOTE — Progress Notes (Signed)
BP 129/76   Pulse 93   Temp 98 F (36.7 C) (Oral)   Ht 5\' 1"  (1.549 m)   Wt 197 lb (89.4 kg)   SpO2 98%   BMI 37.22 kg/m    Subjective:    Patient ID: Alicia Russo, female    DOB: 11/03/1956, 61 y.o.   MRN: [id number]  HPI: Alicia Russo is a 61 y.o. female presenting on 04/21/2017 for Shortness of Breath (began 5 days ago, was seen on Saturday and treated for an asthma flare, symptoms have not improved) and Cough   HPI Shortness of breath and wheezing Patient has been having 5 days worth of shortness of breath and wheezing and she feels like her asthma flares up commonly she gets this in the fall and the spring. She has been using her albuterol and her Symbicort and her Singulair and Flonase and feels like they are helping but not getting rid of the issue. She says she gets a lot this time year because of all pollens in the air. Recommended for her to take a Zyrtec on top of her current medications to see if it can help some more. She denies any fevers or chills. Her cough has been mostly nonproductive but occasionally productive of whitish sputum.  Relevant past medical, surgical, family and social history reviewed and updated as indicated. Interim medical history since our last visit reviewed. Allergies and medications reviewed and updated.  Review of Systems  Constitutional: Negative for chills and fever.  HENT: Positive for congestion, postnasal drip, rhinorrhea, sinus pressure, sneezing and sore throat. Negative for ear discharge and ear pain.   Eyes: Negative for pain, redness and visual disturbance.  Respiratory: Positive for cough, shortness of breath and wheezing. Negative for chest tightness.   Cardiovascular: Negative for chest pain and leg swelling.  Genitourinary: Negative for difficulty urinating and dysuria.  Musculoskeletal: Negative for back pain and gait problem.  Skin: Negative for rash.  Neurological: Negative for light-headedness and headaches.    Psychiatric/Behavioral: Negative for agitation and behavioral problems.  All other systems reviewed and are negative.   Per HPI unless specifically indicated above      Objective:    BP 129/76   Pulse 93   Temp 98 F (36.7 C) (Oral)   Ht 5\' 1"  (1.549 m)   Wt 197 lb (89.4 kg)   SpO2 98%   BMI 37.22 kg/m   Wt Readings from Last 3 Encounters:  04/21/17 197 lb (89.4 kg)  04/17/17 198 lb (89.8 kg)  04/05/17 197 lb 9.6 oz (89.6 kg)    Physical Exam  Constitutional: She is oriented to person, place, and time. She appears well-developed and well-nourished. No distress.  HENT:  Right Ear: Tympanic membrane, external ear and ear canal normal.  Left Ear: Tympanic membrane, external ear and ear canal normal.  Nose: Mucosal edema and rhinorrhea present. No epistaxis. Right sinus exhibits no maxillary sinus tenderness and no frontal sinus tenderness. Left sinus exhibits no maxillary sinus tenderness and no frontal sinus tenderness.  Mouth/Throat: Uvula is midline and mucous membranes are normal. Posterior oropharyngeal edema and posterior oropharyngeal erythema present. No oropharyngeal exudate or tonsillar abscesses.  Eyes: Conjunctivae are normal.  Cardiovascular: Normal rate, regular rhythm, normal heart sounds and intact distal pulses.   No murmur heard. Pulmonary/Chest: Effort normal. No respiratory distress. She has wheezes (mild end-expiratory). She has rales. She exhibits no tenderness.  Musculoskeletal: Normal range of motion. She exhibits no edema or tenderness.  Neurological: She is alert and oriented to person, place, and time. Coordination normal.  Skin: Skin is warm and dry. No rash noted. She is not diaphoretic.  Psychiatric: She has a normal mood and affect. Her behavior is normal.  Vitals reviewed.     Assessment & Plan:   Problem List Items Addressed This Visit    None    Visit Diagnoses    Moderate persistent asthma with exacerbation    -  Primary   Relevant  Medications   methylPREDNISolone acetate (DEPO-MEDROL) injection 80 mg (Start on 04/21/2017 12:00 PM)       Follow up plan: Return if symptoms worsen or fail to improve.  Counseling provided for all of the vaccine components No orders of the defined types were placed in this encounter.   Arville CareJoshua Dettinger, MD [facility] 04/21/2017, 11:50 AM

## 2017-04-23 ENCOUNTER — Other Ambulatory Visit: Payer: Self-pay

## 2017-04-23 NOTE — Patient Outreach (Signed)
[facility] Care Management  04/23/2017  Eusebio MeLynn S Haile 10/10/1956 [id number]   Telephone Screen  Referral Date: 04/22/17 Referral Source: EMMI Prevent Call Referral Reason: "HTN, irregular heart rhythm" Insurance: HTA   Outreach attempt # 1 to patient.No answer at present. RN CM left HIPAA compliant voicemail message along with contact info.      Plan: RN CM will make outreach attempt to patient within three business days if no return call from patient.    Antionette Fairyoshanda Josiephine Simao, RN,BSN,CCM [facility] Care Management Telephonic Care Management Coordinator Direct Phone: [phone] Toll Free: [phone] Fax: [phone]

## 2017-04-28 ENCOUNTER — Other Ambulatory Visit: Payer: Self-pay

## 2017-04-28 NOTE — Patient Outreach (Signed)
[facility] Care Management  04/28/2017  Alicia Russo 10/08/1956 [id number]   Telephone Screen  Referral Date: 04/22/17 Referral Source: EMMI Prevent Call Referral Reason: "HTN, irregular heart rhythm" Insurance: HTA   Outreach attempt #2 to patient. No answer at present.     Plan: RN CM will make outreach attempt to patient within a week.   Antionette Fairyoshanda Takoda Siedlecki, RN,BSN,CCM [facility] Care [facility] Telephonic Care Management Coordinator Direct Phone: [phone] Toll Free: [phone] Fax: [phone]

## 2017-04-29 ENCOUNTER — Ambulatory Visit (INDEPENDENT_AMBULATORY_CARE_PROVIDER_SITE_OTHER): Payer: PPO | Admitting: *Deleted

## 2017-04-29 VITALS — BP 128/85 | HR 89 | Ht 61.0 in | Wt 196.0 lb

## 2017-04-29 DIAGNOSIS — Z780 Asymptomatic menopausal state: Secondary | ICD-10-CM

## 2017-04-29 DIAGNOSIS — Z0000 Encounter for general adult medical examination without abnormal findings: Secondary | ICD-10-CM

## 2017-04-29 DIAGNOSIS — Z1211 Encounter for screening for malignant neoplasm of colon: Secondary | ICD-10-CM

## 2017-04-29 NOTE — Patient Instructions (Addendum)
  [name] ,  Thank you for taking time to come for your Medicare Wellness Visit. I appreciate your ongoing commitment to your health goals. Please review the following plan we discussed and let me know if I can assist you in the future.   These are the goals we discussed: Walk for 30 min 3 times per week  I will order a GI referral for you to have a colonoscopy consultation. Have a bone density scan at your July visit with Prudy FeelerAngel Jones, FNP.

## 2017-04-30 ENCOUNTER — Other Ambulatory Visit: Payer: Self-pay

## 2017-04-30 NOTE — Patient Outreach (Signed)
[facility] Care Management  04/30/2017  Eusebio MeLynn S Freilich 05/11/1956 [id number]    Telephone Screen  Referral Date: 04/22/17 Referral Source: EMMI Prevent Call Referral Reason: "HTN, irregular heart rhythm" Insurance: HTA  Outreach attempt# to patient. No answer at present.     Plan: RN CM will send unsuccessful outreach letter to patient and close case if no response from patient within 10 business days.   Antionette Fairyoshanda Tess Potts, RN,BSN,CCM [facility] Care [facility] Telephonic Care Management Coordinator Direct Phone: [phone] Toll Free: [phone] Fax: [phone]

## 2017-05-03 ENCOUNTER — Ambulatory Visit: Payer: PPO | Admitting: Family Medicine

## 2017-05-03 NOTE — Progress Notes (Signed)
Subjective:   Alicia Russo is a 61 y.o. female who presents for an Initial Medicare Annual Wellness Visit.  Review of Systems     Cardiac Risk Factors include: hypertension;sedentary lifestyle  Other systems negative    Objective:    Today's Vitals   04/29/17 1557  BP: 128/85  Pulse: 89  Weight: 196 lb (88.9 kg)  Height: 5\' 1"  (1.549 m)   Body mass index is 37.03 kg/m.   Current Medications (verified) Outpatient Encounter Prescriptions as of 04/29/2017  Medication Sig  . albuterol (PROVENTIL HFA;VENTOLIN HFA) 108 (90 Base) MCG/ACT inhaler Inhale 2 puffs into the lungs every 6 (six) hours as needed for wheezing or shortness of breath.  Marland Kitchen albuterol (PROVENTIL) (2.5 MG/3ML) 0.083% nebulizer solution Take 2.5 mg by nebulization every 6 (six) hours as needed for wheezing or shortness of breath.  Marland Kitchen aspirin 81 MG EC tablet Take 81 mg by mouth 2 (two) times daily.   . benzonatate (TESSALON) 200 MG capsule Take 1 capsule (200 mg total) by mouth 3 (three) times daily as needed.  . Biotin 10 MG TABS Take 1 tablet by mouth 2 (two) times daily.   . budesonide-formoterol (SYMBICORT) 80-4.5 MCG/ACT inhaler Inhale 2 puffs into the lungs 2 (two) times daily.  . cetirizine (ZYRTEC) 10 MG tablet Take 10 mg by mouth daily.  . Cholecalciferol (VITAMIN D3) 2000 UNITS TABS Take 1 tablet by mouth 2 (two) times daily.  . Cyanocobalamin (VITAMIN B-12 PO) Take 1 tablet by mouth 2 (two) times daily.  . fluticasone (FLONASE) 50 MCG/ACT nasal spray Place 2 sprays into both nostrils 2 (two) times daily.  Marland Kitchen ibuprofen (ADVIL,MOTRIN) 800 MG tablet TAKE 1 TABLET BY MOUTH THREE TIMES DAILY  . levothyroxine (SYNTHROID, LEVOTHROID) 125 MCG tablet Take 1 tablet (125 mcg total) by mouth daily.  Marland Kitchen losartan (COZAAR) 50 MG tablet Take 1 tablet (50 mg total) by mouth daily.  . magnesium 30 MG tablet Take 30 mg by mouth 2 (two) times daily.  . methocarbamol (ROBAXIN) 500 MG tablet TAKE 1 TABLET BY MOUTH EVERY SIX  HOURS AS NEEDED FOR MUSCLE SPASMS  . montelukast (SINGULAIR) 10 MG tablet TAKE 1 TABLET BY MOUTH EVERY EVENING  . Multiple Vitamin (MULTIVITAMIN) tablet Take 1 tablet by mouth daily.  Marland Kitchen omeprazole (PRILOSEC) 20 MG capsule TAKE 1 CAPSULE BY MOUTH EVERY DAY  . SAVELLA 100 MG TABS tablet Take 100 mg by mouth 2 (two) times daily.  . SUPER B COMPLEX/C PO Take 1 tablet by mouth 2 (two) times daily.  Marland Kitchen ALPRAZolam (XANAX) 0.5 MG tablet Take 1 tablet (0.5 mg total) by mouth 2 (two) times daily as needed. (Patient not taking: Reported on 04/29/2017)  . ondansetron (ZOFRAN) 4 MG tablet Take 1 tablet (4 mg total) by mouth every 8 (eight) hours as needed for nausea or vomiting. (Patient not taking: Reported on 04/29/2017)   No facility-administered encounter medications on file as of 04/29/2017.     Allergies (verified) Bee venom; Cherry flavor; Citalopram hydrobromide; and Tramadol   History: Past Medical History:  Diagnosis Date  . Anxiety   . Asthma   . AVM (arteriovenous malformation)    Intracranial with seizures  . Closed fracture of left distal radius   . Fibromyalgia   . GERD (gastroesophageal reflux disease)   . Hyperlipidemia   . Hypothyroidism   . Seizures (HCC)    AVM repair   Past Surgical History:  Procedure Laterality Date  . CHOLECYSTECTOMY    .  CRANIOTOMY     for AVM  . ORIF WRIST FRACTURE Left 02/16/2017   Procedure: OPEN REDUCTION INTERNAL FIXATION (ORIF) LEFT WRIST FRACTURE;  Surgeon: Sheral Apley, MD;  Location: [facility];  Service: Orthopedics;  Laterality: Left;   Family History  Problem Relation Age of Onset  . Emphysema Father        died at age 93  . COPD Father   . Hypertension Mother    Social History   Occupational History  . Not on file.   Social History Main Topics  . Smoking status: Former Smoker    Years: 20.00    Quit date: 04/30/1999  . Smokeless tobacco: Never Used  . Alcohol use No  . Drug use: No  . Sexual activity: No     Tobacco Counseling No tobacco use  Activities of Daily Living In your present state of health, do you have any difficulty performing the following activities: 04/29/2017 02/16/2017  Hearing? Y -  Vision? N -  Difficulty concentrating or making decisions? N -  Walking or climbing stairs? N N  Dressing or bathing? N -  Doing errands, shopping? N -  Preparing Food and eating ? N -  Using the Toilet? N -  In the past six months, have you accidently leaked urine? Y -  Do you have problems with loss of bowel control? N -  Managing your Medications? N -  Managing your Finances? N -  Housekeeping or managing your Housekeeping? N -  Some recent data might be hidden    Immunizations and Health Maintenance Immunization History  Administered Date(s) Administered  . Influenza-Unspecified 08/26/2016   There are no preventive care reminders to display for this patient.  Patient Care Team: Caryl Never as PCP - General (Physician Assistant) Jovita Kussmaul, Adrienne Mocha, RN as [facility] Care Management  Indicate any recent Medical Services you may have received from other than [facility] providers in the past year (date may be approximate).     Assessment:   This is a routine wellness examination for [employer].  Hearing/Vision screen No deficit noted during exam  Dietary issues and exercise activities discussed: Current Exercise Habits: The patient does not participate in regular exercise at present (Plays with grandchildren), Exercise limited by: orthopedic condition(s)  Goals Walk for 30 minutes 3 times per week.  Depression Screen PHQ 2/9 Scores 04/29/2017 04/21/2017 04/17/2017 04/05/2017 03/10/2017 01/05/2017 11/30/2016  PHQ - 2 Score 0 0 0 0 0 0 0    Fall Risk Fall Risk  04/29/2017 04/21/2017 04/17/2017 04/05/2017 03/10/2017  Falls in the past year? Yes Yes Yes Yes Yes  Number falls in past yr: 1 1 1 1 1   Injury with Fall? Yes Yes Yes Yes Yes    Cognitive Function: MMSE  - Mini Mental State Exam 04/29/2017  Orientation to time 5  Orientation to Place 5  Registration 3  Attention/ Calculation 5  Recall 3  Language- name 2 objects 2  Language- repeat 1  Language- follow 3 step command 1  Language- read & follow direction 1  Write a sentence 1  Copy design 0  Total score 27        Screening Tests Health Maintenance  Topic Date Due  . TETANUS/TDAP  08/03/2017 (Originally 09/14/1975)  . HIV Screening  08/03/2017 (Originally 09/14/1971)  . MAMMOGRAM  10/08/2017 (Originally 09/13/2006)  . PAP SMEAR  10/08/2017 (Originally 09/13/1977)  . COLONOSCOPY  10/08/2017 (Originally 09/13/2006)  . INFLUENZA VACCINE  07/14/2017  . Hepatitis C Screening  Completed      Plan:  Will request records for pap smear and mammogram. Colonoscopy referral placed.  Dexa scan ordered. Will have at next visit. Keep f/u with Prudy FeelerAngel Jones, PA-C on 07/05/17  I have personally reviewed and noted the following in the patient's chart:   . Medical and social history . Use of alcohol, tobacco or illicit drugs  . Current medications and supplements . Functional ability and status . Nutritional status . Physical activity . Advanced directives . List of other physicians . Hospitalizations, surgeries, and ER visits in previous 12 months . Vitals . Screenings to include cognitive, depression, and falls . Referrals and appointments  In addition, I have reviewed and discussed with patient certain preventive protocols, quality metrics, and best practice recommendations. A written personalized care plan for preventive services as well as general preventive health recommendations were provided to patient.     Demetrios LollKristen Hudy, RN  05/03/2017   I have reviewed and agree with the above AWV documentation.   Remus LofflerAngel S. Jones PA-C [facility] [address]  [place], [place] [id number] [phone]

## 2017-05-05 ENCOUNTER — Encounter (INDEPENDENT_AMBULATORY_CARE_PROVIDER_SITE_OTHER): Payer: Self-pay | Admitting: *Deleted

## 2017-05-11 ENCOUNTER — Ambulatory Visit (INDEPENDENT_AMBULATORY_CARE_PROVIDER_SITE_OTHER): Payer: PPO

## 2017-05-11 DIAGNOSIS — Z780 Asymptomatic menopausal state: Secondary | ICD-10-CM

## 2017-05-19 ENCOUNTER — Other Ambulatory Visit: Payer: Self-pay

## 2017-05-19 ENCOUNTER — Other Ambulatory Visit: Payer: Self-pay | Admitting: Physician Assistant

## 2017-05-19 DIAGNOSIS — M797 Fibromyalgia: Secondary | ICD-10-CM

## 2017-05-19 NOTE — Patient Outreach (Signed)
[facility] Care Management  05/19/2017  Eusebio MeLynn S Mauro 01/21/1956 [id number]   Telephone Screen  Referral Date: 04/22/17 Referral Source: EMMI Prevent Call Referral Reason: "HTN, irregular heart rhythm" Insurance: HTA    Multiple attempts to establish contact with patient without success. No response from letter mailed to patient. Case is being closed at this time.     Plan: RN CM will notify [facility] administrative assistant of case status.   Antionette Fairyoshanda Kristalyn Bergstresser, RN,BSN,CCM [facility] Care Management Telephonic Care Management Coordinator Direct Phone: [phone] Toll Free: [phone] Fax: [phone]

## 2017-05-24 ENCOUNTER — Encounter: Payer: Self-pay | Admitting: Family Medicine

## 2017-05-24 ENCOUNTER — Ambulatory Visit (INDEPENDENT_AMBULATORY_CARE_PROVIDER_SITE_OTHER): Payer: PPO | Admitting: Family Medicine

## 2017-05-24 DIAGNOSIS — J4521 Mild intermittent asthma with (acute) exacerbation: Secondary | ICD-10-CM | POA: Diagnosis not present

## 2017-05-24 MED ORDER — PREDNISONE 20 MG PO TABS: ORAL_TABLET | ORAL | 0 refills | Status: DC

## 2017-05-24 MED ORDER — CEFDINIR 300 MG PO CAPS: 300.0000 mg | ORAL_CAPSULE | Freq: Two times a day (BID) | ORAL | 0 refills | Status: DC

## 2017-05-24 MED ORDER — ALBUTEROL SULFATE (2.5 MG/3ML) 0.083% IN NEBU: 2.5000 mg | INHALATION_SOLUTION | Freq: Four times a day (QID) | RESPIRATORY_TRACT | 3 refills | Status: DC | PRN

## 2017-05-24 MED ORDER — BENZONATATE 200 MG PO CAPS: 200.0000 mg | ORAL_CAPSULE | Freq: Three times a day (TID) | ORAL | 1 refills | Status: DC | PRN

## 2017-05-24 MED ORDER — ALBUTEROL SULFATE HFA 108 (90 BASE) MCG/ACT IN AERS: 2.0000 | INHALATION_SPRAY | Freq: Four times a day (QID) | RESPIRATORY_TRACT | 6 refills | Status: DC | PRN

## 2017-05-24 MED ORDER — METHYLPREDNISOLONE ACETATE 80 MG/ML IJ SUSP
80.0000 mg | Freq: Once | INTRAMUSCULAR | Status: AC
  Administered 2017-05-24: 80 mg via INTRAMUSCULAR

## 2017-05-24 NOTE — Progress Notes (Signed)
BP 133/82   Pulse 76   Temp 98.6 F (37 C) (Oral)   Ht 5\' 1"  (1.549 m)   Wt 194 lb (88 kg)   SpO2 97%   BMI 36.66 kg/m    Subjective:    Patient ID: Alicia Russo, female    DOB: 12/28/1955, 61 y.o.   MRN: [id number]  HPI: Alicia MeLynn S Mikles is a 61 y.o. female presenting on 05/24/2017 for Nausea, vomiting, fever, headache, (x 2 days; taking Zofran) and Shortness of Breath (needs refill on Albuterol solution & Tessalong Perles)   HPI Shortness of breath and fever and headache Patient has been having shortness of breath and fever and headache that led to her coughing significantly and then led to emesis and nausea. She said that all started with the sinus and the pressure and the wheezing and the shortness of breath. She's been using her albuterol and her Symbicort and Zyrtec and Singulair and Flonase. She would like a refill on her albuterol and her Tessalon Perles to help with this. She said she had a fever of 101 last night and been having a lot of short of breath and wheezing.  Relevant past medical, surgical, family and social history reviewed and updated as indicated. Interim medical history since our last visit reviewed. Allergies and medications reviewed and updated.  Review of Systems  Constitutional: Positive for fever. Negative for chills.  HENT: Positive for congestion, postnasal drip, rhinorrhea, sinus pressure, sneezing and sore throat. Negative for ear discharge and ear pain.   Eyes: Negative for pain, redness and visual disturbance.  Respiratory: Positive for cough, shortness of breath and wheezing. Negative for chest tightness.   Cardiovascular: Negative for chest pain and leg swelling.  Genitourinary: Negative for difficulty urinating and dysuria.  Musculoskeletal: Negative for back pain and gait problem.  Skin: Negative for rash.  Neurological: Negative for light-headedness and headaches.  Psychiatric/Behavioral: Negative for agitation and behavioral problems.  All  other systems reviewed and are negative.   Per HPI unless specifically indicated above        Objective:    BP 133/82   Pulse 76   Temp 98.6 F (37 C) (Oral)   Ht 5\' 1"  (1.549 m)   Wt 194 lb (88 kg)   SpO2 97%   BMI 36.66 kg/m   Wt Readings from Last 3 Encounters:  05/24/17 194 lb (88 kg)  04/29/17 196 lb (88.9 kg)  04/21/17 197 lb (89.4 kg)    Physical Exam  Constitutional: She is oriented to person, place, and time. She appears well-developed and well-nourished. No distress.  HENT:  Right Ear: Tympanic membrane, external ear and ear canal normal.  Left Ear: Tympanic membrane, external ear and ear canal normal.  Nose: Mucosal edema and rhinorrhea present. No epistaxis. Right sinus exhibits no maxillary sinus tenderness and no frontal sinus tenderness. Left sinus exhibits no maxillary sinus tenderness and no frontal sinus tenderness.  Mouth/Throat: Uvula is midline and mucous membranes are normal. Posterior oropharyngeal edema and posterior oropharyngeal erythema present. No oropharyngeal exudate or tonsillar abscesses.  Eyes: Conjunctivae are normal.  Neck: Neck supple. No thyromegaly present.  Cardiovascular: Normal rate, regular rhythm, normal heart sounds and intact distal pulses.   No murmur heard. Pulmonary/Chest: Effort normal. No respiratory distress. She has wheezes (End expiratory wheezes in upper lobes). She has no rales.  Musculoskeletal: Normal range of motion. She exhibits no edema or tenderness.  Lymphadenopathy:    She has no cervical adenopathy.  Neurological:  She is alert and oriented to person, place, and time. Coordination normal.  Skin: Skin is warm and dry. No rash noted. She is not diaphoretic.  Psychiatric: She has a normal mood and affect. Her behavior is normal.  Vitals reviewed.       Assessment & Plan:   Problem List Items Addressed This Visit      Respiratory   Mild intermittent asthma   Relevant Medications   albuterol (PROVENTIL  HFA;VENTOLIN HFA) 108 (90 Base) MCG/ACT inhaler   albuterol (PROVENTIL) (2.5 MG/3ML) 0.083% nebulizer solution   benzonatate (TESSALON) 200 MG capsule   predniSONE (DELTASONE) 20 MG tablet   cefdinir (OMNICEF) 300 MG capsule   methylPREDNISolone acetate (DEPO-MEDROL) injection 80 mg (Start on 05/24/2017 12:30 PM)       Follow up plan: Return if symptoms worsen or fail to improve.  Counseling provided for all of the vaccine components No orders of the defined types were placed in this encounter.   Arville Care, MD [facility] 05/24/2017, 12:16 PM

## 2017-05-26 ENCOUNTER — Ambulatory Visit (INDEPENDENT_AMBULATORY_CARE_PROVIDER_SITE_OTHER): Payer: PPO | Admitting: Pharmacist

## 2017-05-26 ENCOUNTER — Telehealth: Payer: Self-pay | Admitting: Pharmacist

## 2017-05-26 VITALS — Ht 61.0 in | Wt 196.0 lb

## 2017-05-26 DIAGNOSIS — M810 Age-related osteoporosis without current pathological fracture: Secondary | ICD-10-CM | POA: Diagnosis not present

## 2017-05-26 MED ORDER — HYDROCOD POLST-CPM POLST ER 10-8 MG/5ML PO SUER: 5.0000 mL | Freq: Two times a day (BID) | ORAL | 0 refills | Status: DC | PRN

## 2017-05-26 NOTE — Progress Notes (Signed)
Patient ID: Alicia Russo, female   DOB: 09/11/1956, 61 y.o.   MRN: [id number]     HPI: Patient with previous DEXA at another office several years ago.  She was diagnosed with osteoporosis and tried alendronate for about 1 month but stopped because caused N/V.    Back Pain?  No       Kyphosis?  Yes Prior fracture as adult?  Yes  - left wrist 03/2017; also broke left arm 2011                                                             PMH: Age at menopause:  About 61 yo Hysterectomy?  No Oophorectomy?  No HRT? No Steroid Use?  Yes - Current.  Type/duration: only 5 day course Thyroid med?  Yes History of cancer?  No History of digestive disorders (ie Crohn's)?  Yes - GERD Current or previous eating disorders?  No Last Vitamin D Result:  Not checked in last 2 years at our practice. Last GFR Result:  96 (10/05/2016)   FH/SH: Family history of osteoporosis?  Yes  - mother, takes alendroante Parent with history of hip fracture?  No Family history of breast cancer?  No Exercise?  No - not currently Smoking?  No Alcohol?  No     Current Height:   5' 1 " Max Lifetime Height:  5\' 1"  Current Weight:  196#        Ethnicity:Caucasian       Calcium Assessment Calcium Intake  # of servings/day  Calcium mg  Milk (8 oz) 1  x  300  = 300mg   Yogurt (4 oz) 1 x  200 = 200mg   Cheese (1 oz) 0 x  200 = 0  Other Calcium sources   250mg   Ca supplement MVI = 400mg    Estimated calcium intake per day 1150mg     DEXA Results Date of Test T-Score for AP Spine L1-L4 T-Score for Neck Left Hip  05/11/2017 -2.5 -3.0               Assessment: Osteoporosis with history of fracture  Recommendations: 1.  Checking PTH, calcium and vitamin D today - pending.   Plan is to start at least 3 months of either Tymlos or Forteo if lab are WNL.   2.  recommend calcium 1200mg  daily through supplementation or diet.  3.  recommend weight bearing exercise - 30 minutes at least 4 days per week.   4.   Counseled and educated about fall risk and prevention. 5.  Patient is given information to file for LIS application for extra help with Medicare.  Her reported income in close to either receiving full or partial assistance.   Recheck DEXA:  2 years  Time spent counseling patient:  40 minutes   Orders Placed This Encounter  Procedures  . PTH, Intact and Calcium  . VITAMIN D 25 Hydroxy (Vit-D Deficiency, Fractures)

## 2017-05-26 NOTE — Patient Instructions (Signed)
Exercise for Strong Bones  Exercise is important to build and maintain strong bones / bone density.  There are 2 types of exercises that are important to building and maintaining strong bones:  Weight- bearing and muscle-stregthening.  Weight-bearing Exercises  These exercises include activities that make you move against gravity while staying upright. Weight-bearing exercises can be high-impact or low-impact.  High-impact weight-bearing exercises help build bones and keep them strong. If you have broken a bone due to osteoporosis or are at risk of breaking a bone, you may need to avoid high-impact exercises. If you're not sure, you should check with your healthcare provider.  Examples of high-impact weight-bearing exercises are: Dancing  Doing high-impact aerobics  Hiking  Jogging/running  Jumping Rope  Stair climbing  Tennis  Low-impact weight-bearing exercises can also help keep bones strong and are a safe alternative if you cannot do high-impact exercises.   Examples of low-impact weight-bearing exercises are: Using elliptical training machines  Doing low-impact aerobics  Using stair-step machines  Fast walking on a treadmill or outside   Muscle-Strengthening Exercises These exercises include activities where you move your body, a weight or some other resistance against gravity. They are also known as resistance exercises and include: Lifting weights  Using elastic exercise bands  Using weight machines  Lifting your own body weight  Functional movements, such as standing and rising up on your toes  Yoga and Pilates can also improve strength, balance and flexibility. However, certain positions may not be safe for people with osteoporosis or those at increased risk of broken bones. For example, exercises that have you bend forward may increase the chance of breaking a bone in the spine.   Non-Impact Exercises There are other types of exercises that can help  prevent falls.  Non-impact exercises can help you to improve balance, posture and how well you move in everyday activities. Some of these exercises include: Balance exercises that strengthen your legs and test your balance, such as Tai Chi, can decrease your risk of falls.  Posture exercises that improve your posture and reduce rounded or "sloping" shoulders can help you decrease the chance of breaking a bone, especially in the spine.  Functional exercises that improve how well you move can help you with everyday activities and decrease your chance of falling and breaking a bone. For example, if you have trouble getting up from a chair or climbing stairs, you should do these activities as exercises.   **A physical therapist can teach you balance, posture and functional exercises. He/she can also help you learn which exercises are safe and appropriate for you.  [facility] has a physical therapy office in [place] in front of our office and referrals can be made for assessments and treatment as needed and strength and balance training.  If you would like to have an assessment with [facility] and our physical therapy team please let a nurse or provider know.   Fall Prevention in the Home Falls can cause injuries and can affect people from all age groups. There are many simple things that you can do to make your home safe and to help prevent falls. What can I do on the outside of my home?  Regularly repair the edges of walkways and driveways and fix any cracks.  Remove high doorway thresholds.  Trim any shrubbery on the main path into your home.  Use bright outdoor lighting.  Clear walkways of debris and clutter, including tools and rocks.  Regularly check that handrails   are securely fastened and in good repair. Both sides of any steps should have handrails.  Install guardrails along the edges of any raised decks or porches.  Have leaves, snow, and ice cleared regularly.  Use sand or salt on  walkways during winter months.  In the garage, clean up any spills right away, including grease or oil spills. What can I do in the bathroom?  Use night lights.  Install grab bars by the toilet and in the tub and shower. Do not use towel bars as grab bars.  Use non-skid mats or decals on the floor of the tub or shower.  If you need to sit down while you are in the shower, use a plastic, non-slip stool.  Keep the floor dry. Immediately clean up any water that spills on the floor.  Remove soap buildup in the tub or shower on a regular basis.  Attach bath mats securely with double-sided non-slip rug tape.  Remove throw rugs and other tripping hazards from the floor. What can I do in the bedroom?  Use night lights.  Make sure that a bedside light is easy to reach.  Do not use oversized bedding that drapes onto the floor.  Have a firm chair that has side arms to use for getting dressed.  Remove throw rugs and other tripping hazards from the floor. What can I do in the kitchen?  Clean up any spills right away.  Avoid walking on wet floors.  Place frequently used items in easy-to-reach places.  If you need to reach for something above you, use a sturdy step stool that has a grab bar.  Keep electrical cables out of the way.  Do not use floor polish or wax that makes floors slippery. If you have to use wax, make sure that it is non-skid floor wax.  Remove throw rugs and other tripping hazards from the floor. What can I do in the stairways?  Do not leave any items on the stairs.  Make sure that there are handrails on both sides of the stairs. Fix handrails that are broken or loose. Make sure that handrails are as long as the stairways.  Check any carpeting to make sure that it is firmly attached to the stairs. Fix any carpet that is loose or worn.  Avoid having throw rugs at the top or bottom of stairways, or secure the rugs with carpet tape to prevent them from  moving.  Make sure that you have a light switch at the top of the stairs and the bottom of the stairs. If you do not have them, have them installed. What are some other fall prevention tips?  Wear closed-toe shoes that fit well and support your feet. Wear shoes that have rubber soles or low heels.  When you use a stepladder, make sure that it is completely opened and that the sides are firmly locked. Have someone hold the ladder while you are using it. Do not climb a closed stepladder.  Add color or contrast paint or tape to grab bars and handrails in your home. Place contrasting color strips on the first and last steps.  Use mobility aids as needed, such as canes, walkers, scooters, and crutches.  Turn on lights if it is dark. Replace any light bulbs that burn out.  Set up furniture so that there are clear paths. Keep the furniture in the same spot.  Fix any uneven floor surfaces.  Choose a carpet design that does not hide the edge   of steps of a stairway.  Be aware of any and all pets.  Review your medicines with your healthcare provider. Some medicines can cause dizziness or changes in blood pressure, which increase your risk of falling. Talk with your health care provider about other ways that you can decrease your risk of falls. This may include working with a physical therapist or trainer to improve your strength, balance, and endurance. This information is not intended to replace advice given to you by your health care provider. Make sure you discuss any questions you have with your health care provider. Document Released: 11/20/2002 Document Revised: 04/28/2016 Document Reviewed: 01/04/2015 Elsevier Interactive Patient Education  2017 [employer].

## 2017-05-26 NOTE — Telephone Encounter (Signed)
prepared

## 2017-05-27 ENCOUNTER — Encounter: Payer: Self-pay | Admitting: Pharmacist

## 2017-05-27 LAB — PTH, INTACT AND CALCIUM
Calcium: 9.7 mg/dL (ref 8.7–10.3)
PTH: 25 pg/mL (ref 15–65)

## 2017-05-27 LAB — VITAMIN D 25 HYDROXY (VIT D DEFICIENCY, FRACTURES): VIT D 25 HYDROXY: 67.3 ng/mL (ref 30.0–100.0)

## 2017-06-04 ENCOUNTER — Other Ambulatory Visit: Payer: Self-pay | Admitting: Pharmacist

## 2017-06-24 ENCOUNTER — Ambulatory Visit (INDEPENDENT_AMBULATORY_CARE_PROVIDER_SITE_OTHER): Payer: PPO | Admitting: Pharmacist

## 2017-06-24 DIAGNOSIS — M810 Age-related osteoporosis without current pathological fracture: Secondary | ICD-10-CM

## 2017-06-24 MED ORDER — TERIPARATIDE (RECOMBINANT) 600 MCG/2.4ML ~~LOC~~ SOLN: 20.0000 ug | Freq: Every day | SUBCUTANEOUS | 0 refills | Status: DC

## 2017-06-24 NOTE — Progress Notes (Signed)
Patient ID: Alicia Russo, female   DOB: 04/04/1956, 61 y.o.   MRN: [id number]  HPI: Mrs. Alicia Russo has a [age] you female with osteoporosis here today to have education fon administration, side effects on Forteo.   IN the past she tried alendronate for about 1 month but stopped because caused N/V.   Prior fracture as adult?  Yes  - left wrist 03/2017; also broke left arm 2011                                                             PMH: Age at menopause:  About 61 yo Hysterectomy?  No Oophorectomy?  No HRT? No Steroid Use?  No Thyroid med?  Yes History of cancer?  No History of digestive disorders (ie Crohn's)?  Yes - GERD Current or previous eating disorders?  No Last Vitamin D Result:  67.3 (05/26/2017) Last GFR Result:  96 (10/05/2016) PTH was 25 / WNL 05/26/2017   FH/SH: Family history of osteoporosis?  Yes  - mother, takes alendronate.  She tried Forteo but stopped because of cramps / myalgias Parent with history of hip fracture?  No Family history of breast cancer?  No Exercise?  No  Smoking?  No Alcohol?  No         Calcium Assessment Calcium Intake  # of servings/day  Calcium mg  Milk (8 oz) 1  x  300  = 300mg   Yogurt (4 oz) 1 x  200 = 200mg   Cheese (1 oz) 1 x  200 = 200mg   Other Calcium sources   250mg   Ca supplement MVI = 400mg    Estimated calcium intake per day 1350mg     DEXA Results Date of Test T-Score for AP Spine L1-L4 T-Score for Neck Left Hip  05/11/2017 -2.5 -3.0               Assessment: Osteoporosis with fracture in past  Recommendations: 1.  Forteo 20mcg sq daily.  Patient given #3 samples.  She is taught how to injection SQ. Educated about site selection.  First dose given in office today.  Discussed possible side effect to monitor.  2.  continue calcium 1200mg  daily through supplementation or diet.  3.  recommend weight bearing exercise - 30 minutes at least 4 days per week.    Recheck DEXA:  2 years  Time spent counseling patient:  15  minutes   No orders of the defined types were placed in this encounter.

## 2017-07-05 ENCOUNTER — Encounter: Payer: Self-pay | Admitting: Physician Assistant

## 2017-07-05 ENCOUNTER — Ambulatory Visit (INDEPENDENT_AMBULATORY_CARE_PROVIDER_SITE_OTHER): Payer: PPO | Admitting: Physician Assistant

## 2017-07-05 VITALS — BP 137/89 | HR 84 | Temp 98.5°F | Ht 61.0 in | Wt 200.6 lb

## 2017-07-05 DIAGNOSIS — I10 Essential (primary) hypertension: Secondary | ICD-10-CM

## 2017-07-05 DIAGNOSIS — E039 Hypothyroidism, unspecified: Secondary | ICD-10-CM

## 2017-07-05 DIAGNOSIS — G43909 Migraine, unspecified, not intractable, without status migrainosus: Secondary | ICD-10-CM | POA: Insufficient documentation

## 2017-07-05 DIAGNOSIS — K219 Gastro-esophageal reflux disease without esophagitis: Secondary | ICD-10-CM | POA: Diagnosis not present

## 2017-07-05 DIAGNOSIS — E7800 Pure hypercholesterolemia, unspecified: Secondary | ICD-10-CM | POA: Diagnosis not present

## 2017-07-05 DIAGNOSIS — G43809 Other migraine, not intractable, without status migrainosus: Secondary | ICD-10-CM | POA: Diagnosis not present

## 2017-07-05 NOTE — Progress Notes (Signed)
BP 137/89   Pulse 84   Temp 98.5 F (36.9 C) (Oral)   Ht '5\' 1"'  (1.549 m)   Wt 200 lb 9.6 oz (91 kg)   BMI 37.90 kg/m    Subjective:    Patient ID: Alicia Russo, female    DOB: 08-26-1956, 61 y.o.   MRN: [id number]  HPI: [name] is a 61 y.o. female presenting on 07/05/2017 for Follow-up (3 month)  This patient comes in for periodic recheck on medications and conditions including Hypertension, migraine, GERD, hyperlipidemia, hypothyroidism. She is doing well overall. She has eaten today and therefore does want to have her lab work done yet. We'll plan a future order for this. All of her medications are reviewed. Overall she has been doing fairly well except more headaches on days when there is changes in the weather or related to her allergies..   All medications are reviewed today. There are no reports of any problems with the medications. All of the medical conditions are reviewed and updated.  Lab work is reviewed and will be ordered as medically necessary. There are no new problems reported with today's visit.   Relevant past medical, surgical, family and social history reviewed and updated as indicated. Allergies and medications reviewed and updated.  Past Medical History:  Diagnosis Date  . Anxiety   . Asthma   . AVM (arteriovenous malformation)    Intracranial with seizures  . Closed fracture of left distal radius   . Fibromyalgia   . GERD (gastroesophageal reflux disease)   . Hyperlipidemia   . Hypothyroidism   . Seizures ([place])    AVM repair    Past Surgical History:  Procedure Laterality Date  . CHOLECYSTECTOMY    . CRANIOTOMY     for AVM  . ORIF WRIST FRACTURE Left 02/16/2017   Procedure: OPEN REDUCTION INTERNAL FIXATION (ORIF) LEFT WRIST FRACTURE;  Surgeon: Renette Butters, MD;  Location: [facility];  Service: Orthopedics;  Laterality: Left;    Review of Systems  Constitutional: Negative.  Negative for activity change, fatigue and  fever.  HENT: Negative.   Eyes: Negative.   Respiratory: Negative.  Negative for cough.   Cardiovascular: Negative.  Negative for chest pain.  Gastrointestinal: Negative.  Negative for abdominal pain, nausea and vomiting.  Endocrine: Negative.   Genitourinary: Negative.  Negative for dysuria.  Musculoskeletal: Positive for back pain and myalgias.  Skin: Negative.   Neurological: Positive for headaches. Negative for dizziness and speech difficulty.    Allergies as of 07/05/2017      Reactions   Bee Venom    Cant breath, swell up   Cherry Flavor Hives     dizziness   Citalopram Hydrobromide    Mood swings, gets "really really mean"   Tramadol    unknown      Medication List       Accurate as of 07/05/17  3:12 PM. Always use your most recent med list.          albuterol 108 (90 Base) MCG/ACT inhaler Commonly known as:  PROVENTIL HFA;VENTOLIN HFA Inhale 2 puffs into the lungs every 6 (six) hours as needed for wheezing or shortness of breath.   albuterol (2.5 MG/3ML) 0.083% nebulizer solution Commonly known as:  PROVENTIL Take 3 mLs (2.5 mg total) by nebulization every 6 (six) hours as needed for wheezing or shortness of breath.   ALPRAZolam 0.5 MG tablet Commonly known as:  XANAX Take 1 tablet (0.5 mg  total) by mouth 2 (two) times daily as needed.   aspirin 81 MG EC tablet Take 81 mg by mouth 2 (two) times daily.   Biotin 10 MG Tabs Take 1 tablet by mouth 2 (two) times daily.   budesonide-formoterol 80-4.5 MCG/ACT inhaler Commonly known as:  SYMBICORT Inhale 2 puffs into the lungs 2 (two) times daily.   cetirizine 10 MG tablet Commonly known as:  ZYRTEC Take 10 mg by mouth daily.   fluticasone 50 MCG/ACT nasal spray Commonly known as:  FLONASE Place 2 sprays into both nostrils 2 (two) times daily.   ibuprofen 800 MG tablet Commonly known as:  ADVIL,MOTRIN TAKE ONE TABLET BY MOUTH THREE TIMES DAILY   levothyroxine 125 MCG tablet Commonly known as:   SYNTHROID, LEVOTHROID Take 1 tablet (125 mcg total) by mouth daily.   losartan 50 MG tablet Commonly known as:  COZAAR Take 1 tablet (50 mg total) by mouth daily.   magnesium 30 MG tablet Take 30 mg by mouth 2 (two) times daily.   methocarbamol 500 MG tablet Commonly known as:  ROBAXIN TAKE ONE TABLET BY MOUTH EVERY 6 HOURS AS NEEDED FOR MUSCLE SPASMS   montelukast 10 MG tablet Commonly known as:  SINGULAIR TAKE 1 TABLET BY MOUTH EVERY EVENING   multivitamin tablet Take 1 tablet by mouth daily.   omeprazole 20 MG capsule Commonly known as:  PRILOSEC TAKE 1 CAPSULE BY MOUTH EVERY DAY   ondansetron 4 MG tablet Commonly known as:  ZOFRAN Take 1 tablet (4 mg total) by mouth every 8 (eight) hours as needed for nausea or vomiting.   SAVELLA 100 MG Tabs tablet Generic drug:  Milnacipran HCl Take 100 mg by mouth 2 (two) times daily.   SUPER B COMPLEX/C PO Take 1 tablet by mouth 2 (two) times daily.   Teriparatide (Recombinant) 600 MCG/2.4ML Soln Commonly known as:  FORTEO Inject 0.08 mLs (20 mcg total) into the skin daily.   VITAMIN B-12 PO Take 1 tablet by mouth 2 (two) times daily.   Vitamin D3 2000 units Tabs Take 1 tablet by mouth 2 (two) times daily.   zinc gluconate 50 MG tablet Take 50 mg by mouth daily.          Objective:    BP 137/89   Pulse 84   Temp 98.5 F (36.9 C) (Oral)   Ht '5\' 1"'  (1.549 m)   Wt 200 lb 9.6 oz (91 kg)   BMI 37.90 kg/m   Allergies  Allergen Reactions  . Bee Venom     Cant breath, swell up  . Cherry Flavor Hives      dizziness  . Citalopram Hydrobromide     Mood swings, gets "really really mean"  . Tramadol     unknown    Physical Exam  Constitutional: She is oriented to person, place, and time. She appears well-developed and well-nourished.  HENT:  Head: Normocephalic and atraumatic.  Right Ear: Tympanic membrane, external ear and ear canal normal.  Left Ear: Tympanic membrane, external ear and ear canal normal.    Nose: Nose normal. No rhinorrhea.  Mouth/Throat: Oropharynx is clear and moist and mucous membranes are normal. No oropharyngeal exudate or posterior oropharyngeal erythema.  Eyes: Pupils are equal, round, and reactive to light. Conjunctivae and EOM are normal.  Neck: Normal range of motion. Neck supple.  Cardiovascular: Normal rate, regular rhythm, normal heart sounds and intact distal pulses.   Pulmonary/Chest: Effort normal and breath sounds normal.  Abdominal: Soft. Bowel  sounds are normal.  Neurological: She is alert and oriented to person, place, and time. She has normal reflexes.  Skin: Skin is warm and dry. No rash noted.  Psychiatric: She has a normal mood and affect. Her behavior is normal. Judgment and thought content normal.  Nursing note and vitals reviewed.   Results for orders placed or performed in visit on 05/26/17  PTH, Intact and Calcium  Result Value Ref Range   Calcium 9.7 8.7 - 10.3 mg/dL   PTH 25 15 - 65 pg/mL   PTH Interp Comment   VITAMIN D 25 Hydroxy (Vit-D Deficiency, Fractures)  Result Value Ref Range   Vit D, 25-Hydroxy 67.3 30.0 - 100.0 ng/mL      Assessment & Plan:   1. Essential hypertension - CBC with Differential/Platelet; Future - CMP14+EGFR; Future - Lipid panel; Future - TSH; Future  2. Other migraine without status migrainosus, not intractable  3. Gastroesophageal reflux disease without esophagitis  4. Pure hypercholesterolemia - Lipid panel; Future  5. Acquired hypothyroidism - TSH; Future   Current Outpatient Prescriptions:  .  albuterol (PROVENTIL HFA;VENTOLIN HFA) 108 (90 Base) MCG/ACT inhaler, Inhale 2 puffs into the lungs every 6 (six) hours as needed for wheezing or shortness of breath., Disp: 1 Inhaler, Rfl: 6 .  albuterol (PROVENTIL) (2.5 MG/3ML) 0.083% nebulizer solution, Take 3 mLs (2.5 mg total) by nebulization every 6 (six) hours as needed for wheezing or shortness of breath., Disp: 75 mL, Rfl: 3 .  ALPRAZolam (XANAX)  0.5 MG tablet, Take 1 tablet (0.5 mg total) by mouth 2 (two) times daily as needed., Disp: 60 tablet, Rfl: 5 .  aspirin 81 MG EC tablet, Take 81 mg by mouth 2 (two) times daily. , Disp: , Rfl:  .  Biotin 10 MG TABS, Take 1 tablet by mouth 2 (two) times daily. , Disp: , Rfl:  .  budesonide-formoterol (SYMBICORT) 80-4.5 MCG/ACT inhaler, Inhale 2 puffs into the lungs 2 (two) times daily., Disp: 1 Inhaler, Rfl: 3 .  cetirizine (ZYRTEC) 10 MG tablet, Take 10 mg by mouth daily., Disp: , Rfl:  .  Cholecalciferol (VITAMIN D3) 2000 UNITS TABS, Take 1 tablet by mouth 2 (two) times daily., Disp: , Rfl:  .  Cyanocobalamin (VITAMIN B-12 PO), Take 1 tablet by mouth 2 (two) times daily., Disp: , Rfl:  .  fluticasone (FLONASE) 50 MCG/ACT nasal spray, Place 2 sprays into both nostrils 2 (two) times daily., Disp: 16 g, Rfl: 5 .  ibuprofen (ADVIL,MOTRIN) 800 MG tablet, TAKE ONE TABLET BY MOUTH THREE TIMES DAILY (Patient taking differently: TAKE ONE TABLET BY MOUTH THREE TIMES DAILY prn), Disp: 90 tablet, Rfl: 2 .  levothyroxine (SYNTHROID, LEVOTHROID) 125 MCG tablet, Take 1 tablet (125 mcg total) by mouth daily., Disp: 90 tablet, Rfl: 3 .  losartan (COZAAR) 50 MG tablet, Take 1 tablet (50 mg total) by mouth daily., Disp: 90 tablet, Rfl: 3 .  magnesium 30 MG tablet, Take 30 mg by mouth 2 (two) times daily., Disp: , Rfl:  .  methocarbamol (ROBAXIN) 500 MG tablet, TAKE ONE TABLET BY MOUTH EVERY 6 HOURS AS NEEDED FOR MUSCLE SPASMS, Disp: 120 tablet, Rfl: 2 .  montelukast (SINGULAIR) 10 MG tablet, TAKE 1 TABLET BY MOUTH EVERY EVENING, Disp: 270 tablet, Rfl: 0 .  Multiple Vitamin (MULTIVITAMIN) tablet, Take 1 tablet by mouth daily., Disp: , Rfl:  .  omeprazole (PRILOSEC) 20 MG capsule, TAKE 1 CAPSULE BY MOUTH EVERY DAY, Disp: 30 capsule, Rfl: 5 .  ondansetron (ZOFRAN)  4 MG tablet, Take 1 tablet (4 mg total) by mouth every 8 (eight) hours as needed for nausea or vomiting., Disp: 40 tablet, Rfl: 0 .  SAVELLA 100 MG TABS  tablet, Take 100 mg by mouth 2 (two) times daily., Disp: , Rfl: 11 .  SUPER B COMPLEX/C PO, Take 1 tablet by mouth 2 (two) times daily., Disp: , Rfl:  .  Teriparatide, Recombinant, (FORTEO) 600 MCG/2.4ML SOLN, Inject 0.08 mLs (20 mcg total) into the skin daily., Disp: 7.2 mL, Rfl: 0 .  zinc gluconate 50 MG tablet, Take 50 mg by mouth daily., Disp: , Rfl:   Continue all other maintenance medications as listed above.  Follow up plan: Return in about 3 months (around 10/05/2017).  Educational handout given for [place] PA-C [place] [address]  [place], [place] [address] [phone]   07/05/2017, 3:12 PM

## 2017-07-05 NOTE — Patient Instructions (Signed)
In a few days you may receive a survey in the mail or online from [employer] regarding your visit with us today. Please take a moment to fill this out. Your feedback is very important to our whole office. It can help us better understand your needs as well as improve your experience and satisfaction. Thank you for taking your time to complete it. We care about you.  Cathlyn Tersigni, PA-C  

## 2017-07-08 ENCOUNTER — Other Ambulatory Visit: Payer: Self-pay | Admitting: Physician Assistant

## 2017-07-14 ENCOUNTER — Other Ambulatory Visit: Payer: PPO

## 2017-07-14 ENCOUNTER — Other Ambulatory Visit: Payer: Self-pay | Admitting: Physician Assistant

## 2017-07-14 DIAGNOSIS — E039 Hypothyroidism, unspecified: Secondary | ICD-10-CM | POA: Diagnosis not present

## 2017-07-14 DIAGNOSIS — E7800 Pure hypercholesterolemia, unspecified: Secondary | ICD-10-CM | POA: Diagnosis not present

## 2017-07-14 DIAGNOSIS — I10 Essential (primary) hypertension: Secondary | ICD-10-CM

## 2017-07-15 LAB — CBC WITH DIFFERENTIAL/PLATELET
Basophils Absolute: 0.1 10*3/uL (ref 0.0–0.2)
Basos: 1 %
EOS (ABSOLUTE): 0.3 10*3/uL (ref 0.0–0.4)
Eos: 3 %
HEMATOCRIT: 44.4 % (ref 34.0–46.6)
HEMOGLOBIN: 14.4 g/dL (ref 11.1–15.9)
IMMATURE GRANS (ABS): 0.1 10*3/uL (ref 0.0–0.1)
IMMATURE GRANULOCYTES: 1 %
LYMPHS ABS: 2.5 10*3/uL (ref 0.7–3.1)
LYMPHS: 24 %
MCH: 29.4 pg (ref 26.6–33.0)
MCHC: 32.4 g/dL (ref 31.5–35.7)
MCV: 91 fL (ref 79–97)
MONOCYTES: 8 %
Monocytes Absolute: 0.8 10*3/uL (ref 0.1–0.9)
NEUTROS PCT: 63 %
Neutrophils Absolute: 6.7 10*3/uL (ref 1.4–7.0)
Platelets: 324 10*3/uL (ref 150–379)
RBC: 4.89 x10E6/uL (ref 3.77–5.28)
RDW: 16.1 % — ABNORMAL HIGH (ref 12.3–15.4)
WBC: 10.4 10*3/uL (ref 3.4–10.8)

## 2017-07-15 LAB — CMP14+EGFR
ALBUMIN: 3.9 g/dL (ref 3.6–4.8)
ALT: 21 IU/L (ref 0–32)
AST: 20 IU/L (ref 0–40)
Albumin/Globulin Ratio: 1.6 (ref 1.2–2.2)
Alkaline Phosphatase: 110 IU/L (ref 39–117)
BUN / CREAT RATIO: 9 — AB (ref 12–28)
BUN: 7 mg/dL — AB (ref 8–27)
Bilirubin Total: 0.4 mg/dL (ref 0.0–1.2)
CALCIUM: 9.4 mg/dL (ref 8.7–10.3)
CO2: 27 mmol/L (ref 20–29)
CREATININE: 0.81 mg/dL (ref 0.57–1.00)
Chloride: 102 mmol/L (ref 96–106)
GFR calc Af Amer: 91 mL/min/{1.73_m2} (ref >59)
GFR calc non Af Amer: 79 mL/min/{1.73_m2} (ref >59)
GLOBULIN, TOTAL: 2.4 g/dL (ref 1.5–4.5)
GLUCOSE: 113 mg/dL — AB (ref 65–99)
Potassium: 4.8 mmol/L (ref 3.5–5.2)
SODIUM: 144 mmol/L (ref 134–144)
TOTAL PROTEIN: 6.3 g/dL (ref 6.0–8.5)

## 2017-07-15 LAB — TSH: TSH: 6.12 u[IU]/mL — ABNORMAL HIGH (ref 0.450–4.500)

## 2017-07-15 LAB — LIPID PANEL
CHOL/HDL RATIO: 4.2 ratio (ref 0.0–4.4)
CHOLESTEROL TOTAL: 204 mg/dL — AB (ref 100–199)
HDL: 49 mg/dL (ref >39)
LDL CALC: 109 mg/dL — AB (ref 0–99)
Triglycerides: 231 mg/dL — ABNORMAL HIGH (ref 0–149)
VLDL Cholesterol Cal: 46 mg/dL — ABNORMAL HIGH (ref 5–40)

## 2017-07-19 ENCOUNTER — Other Ambulatory Visit: Payer: Self-pay | Admitting: Physician Assistant

## 2017-07-19 DIAGNOSIS — E039 Hypothyroidism, unspecified: Secondary | ICD-10-CM

## 2017-07-19 MED ORDER — LEVOTHYROXINE SODIUM 137 MCG PO TABS: 137.0000 ug | ORAL_TABLET | Freq: Every day | ORAL | 2 refills | Status: DC

## 2017-07-30 DIAGNOSIS — Z029 Encounter for administrative examinations, unspecified: Secondary | ICD-10-CM

## 2017-08-03 ENCOUNTER — Other Ambulatory Visit: Payer: Self-pay | Admitting: Family

## 2017-08-03 DIAGNOSIS — J069 Acute upper respiratory infection, unspecified: Secondary | ICD-10-CM

## 2017-08-03 DIAGNOSIS — J4520 Mild intermittent asthma, uncomplicated: Secondary | ICD-10-CM

## 2017-08-12 ENCOUNTER — Other Ambulatory Visit: Payer: Self-pay | Admitting: Physician Assistant

## 2017-10-04 ENCOUNTER — Other Ambulatory Visit: Payer: Self-pay | Admitting: Family

## 2017-10-04 ENCOUNTER — Other Ambulatory Visit: Payer: Self-pay | Admitting: Physician Assistant

## 2017-10-04 DIAGNOSIS — J069 Acute upper respiratory infection, unspecified: Secondary | ICD-10-CM

## 2017-10-04 DIAGNOSIS — J4520 Mild intermittent asthma, uncomplicated: Secondary | ICD-10-CM

## 2017-10-04 DIAGNOSIS — M797 Fibromyalgia: Secondary | ICD-10-CM

## 2017-10-04 DIAGNOSIS — E039 Hypothyroidism, unspecified: Secondary | ICD-10-CM

## 2017-10-07 DIAGNOSIS — Z1231 Encounter for screening mammogram for malignant neoplasm of breast: Secondary | ICD-10-CM | POA: Diagnosis not present

## 2017-10-11 ENCOUNTER — Encounter: Payer: Self-pay | Admitting: Physician Assistant

## 2017-10-11 ENCOUNTER — Ambulatory Visit (INDEPENDENT_AMBULATORY_CARE_PROVIDER_SITE_OTHER): Payer: PPO | Admitting: Physician Assistant

## 2017-10-11 VITALS — BP 134/84 | HR 69 | Temp 98.2°F | Ht 61.0 in | Wt 202.0 lb

## 2017-10-11 DIAGNOSIS — Z01419 Encounter for gynecological examination (general) (routine) without abnormal findings: Secondary | ICD-10-CM | POA: Insufficient documentation

## 2017-10-11 DIAGNOSIS — M810 Age-related osteoporosis without current pathological fracture: Secondary | ICD-10-CM | POA: Insufficient documentation

## 2017-10-11 DIAGNOSIS — R339 Retention of urine, unspecified: Secondary | ICD-10-CM | POA: Insufficient documentation

## 2017-10-11 DIAGNOSIS — R300 Dysuria: Secondary | ICD-10-CM | POA: Diagnosis not present

## 2017-10-11 LAB — URINALYSIS, COMPLETE
Bilirubin, UA: NEGATIVE
GLUCOSE, UA: NEGATIVE
Ketones, UA: NEGATIVE
Leukocytes, UA: NEGATIVE
Nitrite, UA: NEGATIVE
PH UA: 7.5 (ref 5.0–7.5)
Protein, UA: NEGATIVE
Specific Gravity, UA: 1.015 (ref 1.005–1.030)
UUROB: 0.2 mg/dL (ref 0.2–1.0)

## 2017-10-11 LAB — MICROSCOPIC EXAMINATION: RENAL EPITHEL UA: NONE SEEN /HPF

## 2017-10-11 MED ORDER — TERIPARATIDE (RECOMBINANT) 600 MCG/2.4ML ~~LOC~~ SOLN: 20.0000 ug | Freq: Every day | SUBCUTANEOUS | 11 refills | Status: DC

## 2017-10-11 MED ORDER — SULFAMETHOXAZOLE-TRIMETHOPRIM 800-160 MG PO TABS
1.0000 | ORAL_TABLET | Freq: Two times a day (BID) | ORAL | 0 refills | Status: DC
Start: 2017-10-11 — End: 2017-11-01

## 2017-10-11 NOTE — Progress Notes (Signed)
BP 134/84   Pulse 69   Temp 98.2 F (36.8 C) (Oral)   Ht '5\' 1"'  (1.549 m)   Wt 202 lb (91.6 kg)   BMI 38.17 kg/m    Subjective:    Patient ID: Alicia Russo, female    DOB: 1956-09-26, 61 y.o.   MRN: [id number]  HPI: Alicia Russo is a 61 y.o. female presenting on 10/11/2017 for Annual Exam  This patient comes in for annual well physical examination. All medications are reviewed today. There are no reports of any problems with the medications. All of the medical conditions are reviewed and updated.  Lab work is reviewed and will be ordered as medically necessary.   She reports having to empty her bladder 3 times in the morning to have complete emptying.  This is been going on for several weeks. In the past she has had a history of overactive bladder.  She tended to go very frequently.  She had not had a history of very many urinary tract infections.  At this point the urine is with some odor.  She does not have any severe burning at the urethra.  She complains of some discomfort over the suprapubic region.  Otherwise she is doing well with her medications.  We have discussed the need for a Forteo prescription to be sent to the pharmacy.  We will see if her insurance will cover it.  She has had a pathological fracture and a positive DEXA scan.  In the past she had tried Fosamax and had some GI upset with it.  She is willing to try it again.  She does not member how severe it was.  She is concerned that the cost is going to be too great.  We can also consider Prolia for treatment for her.    Relevant past medical, surgical, family and social history reviewed and updated as indicated. Allergies and medications reviewed and updated.  Past Medical History:  Diagnosis Date  . Anxiety   . Asthma   . AVM (arteriovenous malformation)    Intracranial with seizures  . Closed fracture of left distal radius   . Fibromyalgia   . GERD (gastroesophageal reflux disease)   . Hyperlipidemia   .  Hypothyroidism   . Seizures ([facility])    AVM repair    Past Surgical History:  Procedure Laterality Date  . CHOLECYSTECTOMY    . CRANIOTOMY     for AVM  . ORIF WRIST FRACTURE Left 02/16/2017   Procedure: OPEN REDUCTION INTERNAL FIXATION (ORIF) LEFT WRIST FRACTURE;  Surgeon: Renette Butters, MD;  Location: [facility];  Service: Orthopedics;  Laterality: Left;    Review of Systems  Constitutional: Negative.  Negative for activity change, fatigue and fever.  HENT: Negative.   Eyes: Negative.   Respiratory: Negative.  Negative for cough.   Cardiovascular: Negative.  Negative for chest pain.  Gastrointestinal: Negative.  Negative for abdominal pain.  Endocrine: Negative.   Genitourinary: Positive for difficulty urinating and urgency. Negative for dysuria, hematuria and menstrual problem.  Musculoskeletal: Negative.   Skin: Negative.   Neurological: Negative.     Allergies as of 10/11/2017      Reactions   Bee Venom    Cant breath, swell up   Cherry Flavor Hives     dizziness   Citalopram Hydrobromide    Mood swings, gets "really really mean"   Tramadol    unknown      Medication List  Accurate as of 10/11/17 11:06 AM. Always use your most recent med list.          albuterol 108 (90 Base) MCG/ACT inhaler Commonly known as:  PROVENTIL HFA;VENTOLIN HFA Inhale 2 puffs into the lungs every 6 (six) hours as needed for wheezing or shortness of breath.   albuterol (2.5 MG/3ML) 0.083% nebulizer solution Commonly known as:  PROVENTIL Take 3 mLs (2.5 mg total) by nebulization every 6 (six) hours as needed for wheezing or shortness of breath.   ALPRAZolam 0.5 MG tablet Commonly known as:  XANAX Take 1 tablet (0.5 mg total) by mouth 2 (two) times daily as needed.   aspirin 81 MG EC tablet Take 81 mg by mouth 2 (two) times daily.   Biotin 10 MG Tabs Take 1 tablet by mouth 2 (two) times daily.   cetirizine 10 MG tablet Commonly known as:  ZYRTEC Take 10  mg by mouth daily.   fluticasone 50 MCG/ACT nasal spray Commonly known as:  FLONASE USE TWO SPRAYS IN EACH NOSTRIL TWICE DAILY   ibuprofen 800 MG tablet Commonly known as:  ADVIL,MOTRIN TAKE ONE TABLET BY MOUTH THREE TIMES DAILY   levothyroxine 137 MCG tablet Commonly known as:  SYNTHROID, LEVOTHROID TAKE 1 TABLET BY MOUTH EVERY DAY   losartan 50 MG tablet Commonly known as:  COZAAR Take 1 tablet (50 mg total) by mouth daily.   magnesium 30 MG tablet Take 30 mg by mouth 2 (two) times daily.   methocarbamol 500 MG tablet Commonly known as:  ROBAXIN TAKE ONE TABLET BY MOUTH EVERY 6 HOURS AS NEEDED FOR MUSCLE SPASMS   montelukast 10 MG tablet Commonly known as:  SINGULAIR TAKE 1 TABLET BY MOUTH EVERY EVENING   multivitamin tablet Take 1 tablet by mouth daily.   omeprazole 20 MG capsule Commonly known as:  PRILOSEC TAKE 1 CAPSULE BY MOUTH EVERY DAY   ondansetron 4 MG tablet Commonly known as:  ZOFRAN Take 1 tablet (4 mg total) by mouth every 8 (eight) hours as needed for nausea or vomiting.   SAVELLA 100 MG Tabs tablet Generic drug:  Milnacipran HCl TAKE ONE TABLET BY MOUTH TWICE DAILY   sulfamethoxazole-trimethoprim 800-160 MG tablet Commonly known as:  BACTRIM DS Take 1 tablet by mouth 2 (two) times daily.   SUPER B COMPLEX/C PO Take 1 tablet by mouth 2 (two) times daily.   SYMBICORT 80-4.5 MCG/ACT inhaler Generic drug:  budesonide-formoterol INHALE ONE PUFF INTO LUNCGS TWICE DAILY   Teriparatide (Recombinant) 600 MCG/2.4ML Soln Commonly known as:  FORTEO Inject 0.08 mLs (20 mcg total) into the skin daily.   VITAMIN B-12 PO Take 1 tablet by mouth 2 (two) times daily.   Vitamin D3 2000 units Tabs Take 1 tablet by mouth 2 (two) times daily.   zinc gluconate 50 MG tablet Take 50 mg by mouth daily.          Objective:    BP 134/84   Pulse 69   Temp 98.2 F (36.8 C) (Oral)   Ht '5\' 1"'  (1.549 m)   Wt 202 lb (91.6 kg)   BMI 38.17 kg/m     Allergies  Allergen Reactions  . Bee Venom     Cant breath, swell up  . Cherry Flavor Hives      dizziness  . Citalopram Hydrobromide     Mood swings, gets "really really mean"  . Tramadol     unknown    Physical Exam  Constitutional: She is oriented to person,  place, and time. She appears well-developed and well-nourished.  HENT:  Head: Normocephalic and atraumatic.  Eyes: Pupils are equal, round, and reactive to light. Conjunctivae and EOM are normal.  Neck: Normal range of motion. Neck supple.  Cardiovascular: Normal rate, regular rhythm, normal heart sounds and intact distal pulses.   Pulmonary/Chest: Effort normal and breath sounds normal. Right breast exhibits no mass, no skin change and no tenderness. Left breast exhibits no mass, no skin change and no tenderness. Breasts are symmetrical.  Abdominal: Soft. Bowel sounds are normal.  Genitourinary: Vagina normal and uterus normal. Rectal exam shows no fissure. No breast swelling, tenderness, discharge or bleeding. There is no tenderness or lesion on the right labia. There is no tenderness or lesion on the left labia. Uterus is not deviated, not enlarged and not tender. Cervix exhibits no motion tenderness, no discharge and no friability. Right adnexum displays no mass, no tenderness and no fullness. Left adnexum displays no mass, no tenderness and no fullness. No tenderness or bleeding in the vagina. No vaginal discharge found.  Neurological: She is alert and oriented to person, place, and time. She has normal reflexes.  Skin: Skin is warm and dry. No rash noted.  Psychiatric: She has a normal mood and affect. Her behavior is normal. Judgment and thought content normal.    Results for orders placed or performed in visit on 07/14/17  CBC with Differential/Platelet  Result Value Ref Range   WBC 10.4 3.4 - 10.8 x10E3/uL   RBC 4.89 3.77 - 5.28 x10E6/uL   Hemoglobin 14.4 11.1 - 15.9 g/dL   Hematocrit 44.4 34.0 - 46.6 %   MCV 91 79  - 97 fL   MCH 29.4 26.6 - 33.0 pg   MCHC 32.4 31.5 - 35.7 g/dL   RDW 16.1 (H) 12.3 - 15.4 %   Platelets 324 150 - 379 x10E3/uL   Neutrophils 63 Not Estab. %   Lymphs 24 Not Estab. %   Monocytes 8 Not Estab. %   Eos 3 Not Estab. %   Basos 1 Not Estab. %   Neutrophils Absolute 6.7 1.4 - 7.0 x10E3/uL   Lymphocytes Absolute 2.5 0.7 - 3.1 x10E3/uL   Monocytes Absolute 0.8 0.1 - 0.9 x10E3/uL   EOS (ABSOLUTE) 0.3 0.0 - 0.4 x10E3/uL   Basophils Absolute 0.1 0.0 - 0.2 x10E3/uL   Immature Granulocytes 1 Not Estab. %   Immature Grans (Abs) 0.1 0.0 - 0.1 x10E3/uL  CMP14+EGFR  Result Value Ref Range   Glucose 113 (H) 65 - 99 mg/dL   BUN 7 (L) 8 - 27 mg/dL   Creatinine, Ser 0.81 0.57 - 1.00 mg/dL   GFR calc non Af Amer 79 >59 mL/min/1.73   GFR calc Af Amer 91 >59 mL/min/1.73   BUN/Creatinine Ratio 9 (L) 12 - 28   Sodium 144 134 - 144 mmol/L   Potassium 4.8 3.5 - 5.2 mmol/L   Chloride 102 96 - 106 mmol/L   CO2 27 20 - 29 mmol/L   Calcium 9.4 8.7 - 10.3 mg/dL   Total Protein 6.3 6.0 - 8.5 g/dL   Albumin 3.9 3.6 - 4.8 g/dL   Globulin, Total 2.4 1.5 - 4.5 g/dL   Albumin/Globulin Ratio 1.6 1.2 - 2.2   Bilirubin Total 0.4 0.0 - 1.2 mg/dL   Alkaline Phosphatase 110 39 - 117 IU/L   AST 20 0 - 40 IU/L   ALT 21 0 - 32 IU/L  Lipid panel  Result Value Ref Range   Cholesterol,  Total 204 (H) 100 - 199 mg/dL   Triglycerides 231 (H) 0 - 149 mg/dL   HDL 49 >39 mg/dL   VLDL Cholesterol Cal 46 (H) 5 - 40 mg/dL   LDL Calculated 109 (H) 0 - 99 mg/dL   Chol/HDL Ratio 4.2 0.0 - 4.4 ratio  TSH  Result Value Ref Range   TSH 6.120 (H) 0.450 - 4.500 uIU/mL      Assessment & Plan:   1. Well female exam with routine gynecological exam - CMP14+EGFR - Lipid panel - CBC with Differential/Platelet - TSH - Pap IG (Image Guided)  2. Dysuria - Urinalysis, Complete - Ambulatory referral to Urology  3. Age-related osteoporosis without current pathological fracture - Teriparatide, Recombinant, (FORTEO)  600 MCG/2.4ML SOLN; Inject 0.08 mLs (20 mcg total) into the skin daily.  Dispense: 7.2 mL; Refill: 11  4. Incomplete emptying of bladder - Ambulatory referral to Urology    Current Outpatient Prescriptions:  .  albuterol (PROVENTIL HFA;VENTOLIN HFA) 108 (90 Base) MCG/ACT inhaler, Inhale 2 puffs into the lungs every 6 (six) hours as needed for wheezing or shortness of breath., Disp: 1 Inhaler, Rfl: 6 .  albuterol (PROVENTIL) (2.5 MG/3ML) 0.083% nebulizer solution, Take 3 mLs (2.5 mg total) by nebulization every 6 (six) hours as needed for wheezing or shortness of breath., Disp: 75 mL, Rfl: 3 .  ALPRAZolam (XANAX) 0.5 MG tablet, Take 1 tablet (0.5 mg total) by mouth 2 (two) times daily as needed., Disp: 60 tablet, Rfl: 5 .  aspirin 81 MG EC tablet, Take 81 mg by mouth 2 (two) times daily. , Disp: , Rfl:  .  Biotin 10 MG TABS, Take 1 tablet by mouth 2 (two) times daily. , Disp: , Rfl:  .  cetirizine (ZYRTEC) 10 MG tablet, Take 10 mg by mouth daily., Disp: , Rfl:  .  Cholecalciferol (VITAMIN D3) 2000 UNITS TABS, Take 1 tablet by mouth 2 (two) times daily., Disp: , Rfl:  .  Cyanocobalamin (VITAMIN B-12 PO), Take 1 tablet by mouth 2 (two) times daily., Disp: , Rfl:  .  fluticasone (FLONASE) 50 MCG/ACT nasal spray, USE TWO SPRAYS IN EACH NOSTRIL TWICE DAILY, Disp: 16 g, Rfl: 2 .  ibuprofen (ADVIL,MOTRIN) 800 MG tablet, TAKE ONE TABLET BY MOUTH THREE TIMES DAILY (Patient taking differently: TAKE ONE TABLET BY MOUTH THREE TIMES DAILY prn), Disp: 90 tablet, Rfl: 2 .  levothyroxine (SYNTHROID, LEVOTHROID) 137 MCG tablet, TAKE 1 TABLET BY MOUTH EVERY DAY, Disp: 30 tablet, Rfl: 0 .  losartan (COZAAR) 50 MG tablet, Take 1 tablet (50 mg total) by mouth daily., Disp: 90 tablet, Rfl: 3 .  magnesium 30 MG tablet, Take 30 mg by mouth 2 (two) times daily., Disp: , Rfl:  .  methocarbamol (ROBAXIN) 500 MG tablet, TAKE ONE TABLET BY MOUTH EVERY 6 HOURS AS NEEDED FOR MUSCLE SPASMS, Disp: 120 tablet, Rfl: 0 .   montelukast (SINGULAIR) 10 MG tablet, TAKE 1 TABLET BY MOUTH EVERY EVENING, Disp: 270 tablet, Rfl: 0 .  Multiple Vitamin (MULTIVITAMIN) tablet, Take 1 tablet by mouth daily., Disp: , Rfl:  .  omeprazole (PRILOSEC) 20 MG capsule, TAKE 1 CAPSULE BY MOUTH EVERY DAY, Disp: 30 capsule, Rfl: 5 .  ondansetron (ZOFRAN) 4 MG tablet, Take 1 tablet (4 mg total) by mouth every 8 (eight) hours as needed for nausea or vomiting., Disp: 40 tablet, Rfl: 0 .  SAVELLA 100 MG TABS tablet, TAKE ONE TABLET BY MOUTH TWICE DAILY, Disp: 60 tablet, Rfl: 0 .  SUPER  B COMPLEX/C PO, Take 1 tablet by mouth 2 (two) times daily., Disp: , Rfl:  .  SYMBICORT 80-4.5 MCG/ACT inhaler, INHALE ONE PUFF INTO LUNCGS TWICE DAILY, Disp: 6.9 g, Rfl: 2 .  Teriparatide, Recombinant, (FORTEO) 600 MCG/2.4ML SOLN, Inject 0.08 mLs (20 mcg total) into the skin daily., Disp: 7.2 mL, Rfl: 11 .  zinc gluconate 50 MG tablet, Take 50 mg by mouth daily., Disp: , Rfl:  .  sulfamethoxazole-trimethoprim (BACTRIM DS) 800-160 MG tablet, Take 1 tablet by mouth 2 (two) times daily., Disp: 14 tablet, Rfl: 0 Continue all other maintenance medications as listed above.  Follow up plan: Return in about 6 months (around 04/11/2018) for recheck.  Educational handout given for [place] PA-C [facility] [address]  [place], [place] [address] [phone]   10/11/2017, 11:06 AM

## 2017-10-11 NOTE — Patient Instructions (Signed)
In a few days you may receive a survey in the mail or online from [employer] regarding your visit with us today. Please take a moment to fill this out. Your feedback is very important to our whole office. It can help us better understand your needs as well as improve your experience and satisfaction. Thank you for taking your time to complete it. We care about you.  Kyce Ging, PA-C  

## 2017-10-11 NOTE — Addendum Note (Signed)
Addended by: Tamera PuntWRAY, Gorje Iyer S on: 10/11/2017 04:36 PM   Modules accepted: Orders

## 2017-10-12 LAB — PAP IG (IMAGE GUIDED): PAP Smear Comment: 0

## 2017-10-13 ENCOUNTER — Other Ambulatory Visit: Payer: PPO

## 2017-10-13 DIAGNOSIS — Z01419 Encounter for gynecological examination (general) (routine) without abnormal findings: Secondary | ICD-10-CM | POA: Diagnosis not present

## 2017-10-14 LAB — CMP14+EGFR
ALK PHOS: 109 IU/L (ref 39–117)
ALT: 30 IU/L (ref 0–32)
AST: 22 IU/L (ref 0–40)
Albumin/Globulin Ratio: 1.8 (ref 1.2–2.2)
Albumin: 3.9 g/dL (ref 3.6–4.8)
BUN/Creatinine Ratio: 9 — ABNORMAL LOW (ref 12–28)
BUN: 8 mg/dL (ref 8–27)
Bilirubin Total: <0.2 mg/dL (ref 0.0–1.2)
CO2: 24 mmol/L (ref 20–29)
CREATININE: 0.87 mg/dL (ref 0.57–1.00)
Calcium: 9 mg/dL (ref 8.7–10.3)
Chloride: 103 mmol/L (ref 96–106)
GFR calc Af Amer: 83 mL/min/{1.73_m2} (ref >59)
GFR calc non Af Amer: 72 mL/min/{1.73_m2} (ref >59)
GLOBULIN, TOTAL: 2.2 g/dL (ref 1.5–4.5)
GLUCOSE: 98 mg/dL (ref 65–99)
Potassium: 4.6 mmol/L (ref 3.5–5.2)
SODIUM: 145 mmol/L — AB (ref 134–144)
Total Protein: 6.1 g/dL (ref 6.0–8.5)

## 2017-10-14 LAB — CBC WITH DIFFERENTIAL/PLATELET
BASOS: 1 %
Basophils Absolute: 0.1 10*3/uL (ref 0.0–0.2)
EOS (ABSOLUTE): 0.3 10*3/uL (ref 0.0–0.4)
Eos: 3 %
HEMATOCRIT: 45.6 % (ref 34.0–46.6)
Hemoglobin: 14.6 g/dL (ref 11.1–15.9)
IMMATURE GRANS (ABS): 0 10*3/uL (ref 0.0–0.1)
IMMATURE GRANULOCYTES: 0 %
LYMPHS: 29 %
Lymphocytes Absolute: 2.7 10*3/uL (ref 0.7–3.1)
MCH: 29.6 pg (ref 26.6–33.0)
MCHC: 32 g/dL (ref 31.5–35.7)
MCV: 92 fL (ref 79–97)
Monocytes Absolute: 0.5 10*3/uL (ref 0.1–0.9)
Monocytes: 6 %
NEUTROS PCT: 61 %
Neutrophils Absolute: 5.7 10*3/uL (ref 1.4–7.0)
Platelets: 346 10*3/uL (ref 150–379)
RBC: 4.94 x10E6/uL (ref 3.77–5.28)
RDW: 14.5 % (ref 12.3–15.4)
WBC: 9.4 10*3/uL (ref 3.4–10.8)

## 2017-10-14 LAB — TSH: TSH: 1.97 u[IU]/mL (ref 0.450–4.500)

## 2017-10-14 LAB — LIPID PANEL
CHOL/HDL RATIO: 3.5 ratio (ref 0.0–4.4)
CHOLESTEROL TOTAL: 183 mg/dL (ref 100–199)
HDL: 52 mg/dL (ref >39)
LDL Calculated: 103 mg/dL — ABNORMAL HIGH (ref 0–99)
TRIGLYCERIDES: 140 mg/dL (ref 0–149)
VLDL Cholesterol Cal: 28 mg/dL (ref 5–40)

## 2017-11-01 ENCOUNTER — Encounter: Payer: Self-pay | Admitting: Physician Assistant

## 2017-11-01 ENCOUNTER — Ambulatory Visit: Payer: PPO | Admitting: Physician Assistant

## 2017-11-01 VITALS — BP 135/82 | HR 76 | Temp 98.1°F | Ht 61.0 in | Wt 204.0 lb

## 2017-11-01 DIAGNOSIS — J40 Bronchitis, not specified as acute or chronic: Secondary | ICD-10-CM | POA: Diagnosis not present

## 2017-11-01 MED ORDER — CEFDINIR 300 MG PO CAPS: 300.0000 mg | ORAL_CAPSULE | Freq: Two times a day (BID) | ORAL | 0 refills | Status: DC

## 2017-11-01 MED ORDER — METHYLPREDNISOLONE ACETATE 80 MG/ML IJ SUSP
80.0000 mg | Freq: Once | INTRAMUSCULAR | Status: AC
  Administered 2017-11-01: 80 mg via INTRAMUSCULAR

## 2017-11-01 NOTE — Progress Notes (Signed)
BP 135/82   Pulse 76   Temp 98.1 F (36.7 C) (Oral)   Ht 5\' 1"  (1.549 m)   Wt 204 lb (92.5 kg)   SpO2 100%   BMI 38.55 kg/m    Subjective:    Patient ID: Alicia Russo, female    DOB: 18-Nov-1956, 61 y.o.   MRN: [id number]  HPI: Alicia Russo is a 61 y.o. female presenting on 11/01/2017 for Cough (pt here today c/o cough and congestion )  Patient with several days of progressing upper respiratory and bronchial symptoms. Initially there was more upper respiratory congestion. This progressed to having significant cough that is productive throughout the day and severe at night. There is occasional wheezing after coughing. Sometimes there is slight dyspnea on exertion. It is productive mucus that is yellow in color. Denies any blood.   Relevant past medical, surgical, family and social history reviewed and updated as indicated. Allergies and medications reviewed and updated.  Past Medical History:  Diagnosis Date  . Anxiety   . Asthma   . AVM (arteriovenous malformation)    Intracranial with seizures  . Closed fracture of left distal radius   . Fibromyalgia   . GERD (gastroesophageal reflux disease)   . Hyperlipidemia   . Hypothyroidism   . Seizures ([facility])    AVM repair    Past Surgical History:  Procedure Laterality Date  . CHOLECYSTECTOMY    . CRANIOTOMY     for AVM  . OPEN REDUCTION INTERNAL FIXATION (ORIF) LEFT WRIST FRACTURE Left 02/16/2017   Performed by Sheral Apley, MD at [facility]    Review of Systems  Constitutional: Positive for chills, fatigue and fever. Negative for activity change and appetite change.  HENT: Positive for congestion, postnasal drip, sinus pain and sore throat.   Eyes: Negative.   Respiratory: Positive for cough and wheezing. Negative for shortness of breath.   Cardiovascular: Negative.  Negative for chest pain, palpitations and leg swelling.  Gastrointestinal: Negative.   Genitourinary: Negative.   Musculoskeletal:  Negative.   Skin: Negative.   Neurological: Positive for headaches.    Allergies as of 11/01/2017      Reactions   Bee Venom    Cant breath, swell up   Cherry Flavor Hives     dizziness   Citalopram Hydrobromide    Mood swings, gets "really really mean"   Tramadol    unknown      Medication List        Accurate as of 11/01/17  5:25 PM. Always use your most recent med list.          albuterol 108 (90 Base) MCG/ACT inhaler Commonly known as:  PROVENTIL HFA;VENTOLIN HFA Inhale 2 puffs into the lungs every 6 (six) hours as needed for wheezing or shortness of breath.   albuterol (2.5 MG/3ML) 0.083% nebulizer solution Commonly known as:  PROVENTIL Take 3 mLs (2.5 mg total) by nebulization every 6 (six) hours as needed for wheezing or shortness of breath.   ALPRAZolam 0.5 MG tablet Commonly known as:  XANAX Take 1 tablet (0.5 mg total) by mouth 2 (two) times daily as needed.   aspirin 81 MG EC tablet Take 81 mg by mouth 2 (two) times daily.   Biotin 10 MG Tabs Take 1 tablet by mouth 2 (two) times daily.   cefdinir 300 MG capsule Commonly known as:  OMNICEF Take 1 capsule (300 mg total) 2 (two) times daily by mouth. 1 po BID  cetirizine 10 MG tablet Commonly known as:  ZYRTEC Take 10 mg by mouth daily.   fluticasone 50 MCG/ACT nasal spray Commonly known as:  FLONASE USE TWO SPRAYS IN EACH NOSTRIL TWICE DAILY   ibuprofen 800 MG tablet Commonly known as:  ADVIL,MOTRIN TAKE ONE TABLET BY MOUTH THREE TIMES DAILY   levothyroxine 137 MCG tablet Commonly known as:  SYNTHROID, LEVOTHROID TAKE 1 TABLET BY MOUTH EVERY DAY   losartan 50 MG tablet Commonly known as:  COZAAR Take 1 tablet (50 mg total) by mouth daily.   magnesium 30 MG tablet Take 30 mg by mouth 2 (two) times daily.   methocarbamol 500 MG tablet Commonly known as:  ROBAXIN TAKE ONE TABLET BY MOUTH EVERY 6 HOURS AS NEEDED FOR MUSCLE SPASMS   montelukast 10 MG tablet Commonly known as:   SINGULAIR TAKE 1 TABLET BY MOUTH EVERY EVENING   multivitamin tablet Take 1 tablet by mouth daily.   omeprazole 20 MG capsule Commonly known as:  PRILOSEC TAKE 1 CAPSULE BY MOUTH EVERY DAY   ondansetron 4 MG tablet Commonly known as:  ZOFRAN Take 1 tablet (4 mg total) by mouth every 8 (eight) hours as needed for nausea or vomiting.   SAVELLA 100 MG Tabs tablet Generic drug:  Milnacipran HCl TAKE ONE TABLET BY MOUTH TWICE DAILY   SUPER B COMPLEX/C PO Take 1 tablet by mouth 2 (two) times daily.   SYMBICORT 80-4.5 MCG/ACT inhaler Generic drug:  budesonide-formoterol INHALE ONE PUFF INTO LUNCGS TWICE DAILY   Teriparatide (Recombinant) 600 MCG/2.4ML Soln Commonly known as:  FORTEO Inject 0.08 mLs (20 mcg total) into the skin daily.   VITAMIN B-12 PO Take 1 tablet by mouth 2 (two) times daily.   Vitamin D3 2000 units Tabs Take 1 tablet by mouth 2 (two) times daily.   zinc gluconate 50 MG tablet Take 50 mg by mouth daily.          Objective:    BP 135/82   Pulse 76   Temp 98.1 F (36.7 C) (Oral)   Ht 5\' 1"  (1.549 m)   Wt 204 lb (92.5 kg)   SpO2 100%   BMI 38.55 kg/m   Allergies  Allergen Reactions  . Bee Venom     Cant breath, swell up  . Cherry Flavor Hives      dizziness  . Citalopram Hydrobromide     Mood swings, gets "really really mean"  . Tramadol     unknown    Physical Exam  Constitutional: She is oriented to person, place, and time. She appears well-developed and well-nourished.  HENT:  Head: Normocephalic and atraumatic.  Right Ear: There is drainage and tenderness.  Left Ear: There is drainage and tenderness.  Nose: Mucosal edema and rhinorrhea present. Right sinus exhibits maxillary sinus tenderness and frontal sinus tenderness. Left sinus exhibits maxillary sinus tenderness and frontal sinus tenderness.  Mouth/Throat: Oropharyngeal exudate and posterior oropharyngeal erythema present.  Eyes: Conjunctivae and EOM are normal. Pupils are  equal, round, and reactive to light.  Neck: Normal range of motion. Neck supple.  Cardiovascular: Normal rate, regular rhythm, normal heart sounds and intact distal pulses.  Pulmonary/Chest: Effort normal. She has wheezes in the right upper field and the left upper field.  Abdominal: Soft. Bowel sounds are normal.  Neurological: She is alert and oriented to person, place, and time. She has normal reflexes.  Skin: Skin is warm and dry. No rash noted.  Psychiatric: She has a normal mood and affect.  Her behavior is normal. Judgment and thought content normal.        Assessment & Plan:   1. Bronchitis - methylPREDNISolone acetate (DEPO-MEDROL) injection 80 mg    Current Outpatient Medications:  .  albuterol (PROVENTIL HFA;VENTOLIN HFA) 108 (90 Base) MCG/ACT inhaler, Inhale 2 puffs into the lungs every 6 (six) hours as needed for wheezing or shortness of breath., Disp: 1 Inhaler, Rfl: 6 .  albuterol (PROVENTIL) (2.5 MG/3ML) 0.083% nebulizer solution, Take 3 mLs (2.5 mg total) by nebulization every 6 (six) hours as needed for wheezing or shortness of breath., Disp: 75 mL, Rfl: 3 .  ALPRAZolam (XANAX) 0.5 MG tablet, Take 1 tablet (0.5 mg total) by mouth 2 (two) times daily as needed., Disp: 60 tablet, Rfl: 5 .  aspirin 81 MG EC tablet, Take 81 mg by mouth 2 (two) times daily. , Disp: , Rfl:  .  Biotin 10 MG TABS, Take 1 tablet by mouth 2 (two) times daily. , Disp: , Rfl:  .  cetirizine (ZYRTEC) 10 MG tablet, Take 10 mg by mouth daily., Disp: , Rfl:  .  Cholecalciferol (VITAMIN D3) 2000 UNITS TABS, Take 1 tablet by mouth 2 (two) times daily., Disp: , Rfl:  .  Cyanocobalamin (VITAMIN B-12 PO), Take 1 tablet by mouth 2 (two) times daily., Disp: , Rfl:  .  fluticasone (FLONASE) 50 MCG/ACT nasal spray, USE TWO SPRAYS IN EACH NOSTRIL TWICE DAILY, Disp: 16 g, Rfl: 2 .  ibuprofen (ADVIL,MOTRIN) 800 MG tablet, TAKE ONE TABLET BY MOUTH THREE TIMES DAILY (Patient taking differently: TAKE ONE TABLET BY  MOUTH THREE TIMES DAILY prn), Disp: 90 tablet, Rfl: 2 .  levothyroxine (SYNTHROID, LEVOTHROID) 137 MCG tablet, TAKE 1 TABLET BY MOUTH EVERY DAY, Disp: 30 tablet, Rfl: 0 .  losartan (COZAAR) 50 MG tablet, Take 1 tablet (50 mg total) by mouth daily., Disp: 90 tablet, Rfl: 3 .  magnesium 30 MG tablet, Take 30 mg by mouth 2 (two) times daily., Disp: , Rfl:  .  methocarbamol (ROBAXIN) 500 MG tablet, TAKE ONE TABLET BY MOUTH EVERY 6 HOURS AS NEEDED FOR MUSCLE SPASMS, Disp: 120 tablet, Rfl: 0 .  montelukast (SINGULAIR) 10 MG tablet, TAKE 1 TABLET BY MOUTH EVERY EVENING, Disp: 270 tablet, Rfl: 0 .  Multiple Vitamin (MULTIVITAMIN) tablet, Take 1 tablet by mouth daily., Disp: , Rfl:  .  omeprazole (PRILOSEC) 20 MG capsule, TAKE 1 CAPSULE BY MOUTH EVERY DAY, Disp: 30 capsule, Rfl: 5 .  ondansetron (ZOFRAN) 4 MG tablet, Take 1 tablet (4 mg total) by mouth every 8 (eight) hours as needed for nausea or vomiting., Disp: 40 tablet, Rfl: 0 .  SAVELLA 100 MG TABS tablet, TAKE ONE TABLET BY MOUTH TWICE DAILY, Disp: 60 tablet, Rfl: 0 .  SUPER B COMPLEX/C PO, Take 1 tablet by mouth 2 (two) times daily., Disp: , Rfl:  .  SYMBICORT 80-4.5 MCG/ACT inhaler, INHALE ONE PUFF INTO LUNCGS TWICE DAILY, Disp: 6.9 g, Rfl: 2 .  Teriparatide, Recombinant, (FORTEO) 600 MCG/2.4ML SOLN, Inject 0.08 mLs (20 mcg total) into the skin daily., Disp: 7.2 mL, Rfl: 11 .  zinc gluconate 50 MG tablet, Take 50 mg by mouth daily., Disp: , Rfl:  .  cefdinir (OMNICEF) 300 MG capsule, Take 1 capsule (300 mg total) 2 (two) times daily by mouth. 1 po BID, Disp: 20 capsule, Rfl: 0  Current Facility-Administered Medications:  .  methylPREDNISolone acetate (DEPO-MEDROL) injection 80 mg, 80 mg, Intramuscular, Once, Yetta BarreJones, Kaleigh Spiegelman S, PA-C Continue all other maintenance  medications as listed above.  Follow up plan: Return if symptoms worsen or fail to improve.  Educational handout given for survey  Remus Loffler PA-C [facility] [address]  [place], [place] [address] [phone]   11/01/2017, 5:25 PM

## 2017-11-01 NOTE — Patient Instructions (Signed)
In a few days you may receive a survey in the mail or online from [employer] regarding your visit with us today. Please take a moment to fill this out. Your feedback is very important to our whole office. It can help us better understand your needs as well as improve your experience and satisfaction. Thank you for taking your time to complete it. We care about you.  Zykeriah Mathia, PA-C  

## 2017-11-11 ENCOUNTER — Other Ambulatory Visit: Payer: Self-pay | Admitting: Physician Assistant

## 2017-11-11 DIAGNOSIS — E039 Hypothyroidism, unspecified: Secondary | ICD-10-CM

## 2017-11-11 DIAGNOSIS — M797 Fibromyalgia: Secondary | ICD-10-CM

## 2017-11-12 ENCOUNTER — Other Ambulatory Visit: Payer: Self-pay | Admitting: Physician Assistant

## 2017-11-12 DIAGNOSIS — I10 Essential (primary) hypertension: Secondary | ICD-10-CM

## 2017-11-15 ENCOUNTER — Telehealth: Payer: Self-pay | Admitting: Physician Assistant

## 2017-11-15 DIAGNOSIS — I10 Essential (primary) hypertension: Secondary | ICD-10-CM

## 2017-11-15 MED ORDER — LOSARTAN POTASSIUM 50 MG PO TABS: 25.0000 mg | ORAL_TABLET | Freq: Every day | ORAL | 3 refills | Status: DC

## 2017-11-15 NOTE — Telephone Encounter (Signed)
Pt states she is only taking 1/2 losartan (COZAAR) 50 MG tablet. Pharmacy needs to know how pt is suppose to take? Please advise.

## 2017-11-30 DIAGNOSIS — R3912 Poor urinary stream: Secondary | ICD-10-CM | POA: Diagnosis not present

## 2017-11-30 DIAGNOSIS — R3914 Feeling of incomplete bladder emptying: Secondary | ICD-10-CM | POA: Diagnosis not present

## 2017-11-30 DIAGNOSIS — R310 Gross hematuria: Secondary | ICD-10-CM | POA: Diagnosis not present

## 2017-12-03 ENCOUNTER — Ambulatory Visit: Payer: PPO | Admitting: Family Medicine

## 2017-12-03 ENCOUNTER — Ambulatory Visit: Payer: PPO

## 2017-12-04 ENCOUNTER — Ambulatory Visit: Payer: PPO | Admitting: Family Medicine

## 2017-12-04 ENCOUNTER — Encounter: Payer: Self-pay | Admitting: Family Medicine

## 2017-12-04 VITALS — BP 146/95 | HR 75 | Temp 97.4°F | Ht 61.0 in | Wt 196.8 lb

## 2017-12-04 DIAGNOSIS — J40 Bronchitis, not specified as acute or chronic: Secondary | ICD-10-CM | POA: Diagnosis not present

## 2017-12-04 MED ORDER — PREDNISONE 20 MG PO TABS: ORAL_TABLET | ORAL | 0 refills | Status: DC

## 2017-12-04 NOTE — Progress Notes (Signed)
BP (!) 146/95   Pulse 75   Temp (!) 97.4 F (36.3 C) (Oral)   Ht 5\' 1"  (1.549 m)   Wt 196 lb 12.8 oz (89.3 kg)   BMI 37.19 kg/m    Subjective:    Patient ID: Alicia Russo, female    DOB: 08/26/1956, 61 y.o.   MRN: [id number]  HPI: Alicia Russo is a 61 y.o. female presenting on 12/04/2017 for Sinusitis (2-3 facial pressure, no known fever)   HPI Sinus drainage and cough and nasal congestion Patient has had sinus drainage and congestion and nasal drainage is been going on for the past 2 days and she started to develop facial pressure.  She has been using her Flonase and her inhalers which have been helping but not getting rid of it.  She denies any fevers or chills or shortness of breath or wheezing but mainly she has the coughing and the drainage and a little bit of chest congestion as well.  Relevant past medical, surgical, family and social history reviewed and updated as indicated. Interim medical history since our last visit reviewed. Allergies and medications reviewed and updated.  Review of Systems  Constitutional: Negative for chills and fever.  HENT: Positive for congestion, postnasal drip, rhinorrhea, sinus pressure and sore throat. Negative for ear discharge, ear pain and sneezing.   Eyes: Negative for pain, redness and visual disturbance.  Respiratory: Positive for cough. Negative for chest tightness, shortness of breath and wheezing.   Cardiovascular: Negative for chest pain and leg swelling.  Genitourinary: Negative for difficulty urinating and dysuria.  Musculoskeletal: Negative for back pain and gait problem.  Skin: Negative for rash.  Neurological: Negative for light-headedness and headaches.  Psychiatric/Behavioral: Negative for agitation and behavioral problems.  All other systems reviewed and are negative.   Per HPI unless specifically indicated above        Objective:    BP (!) 146/95   Pulse 75   Temp (!) 97.4 F (36.3 C) (Oral)   Ht 5\' 1"   (1.549 m)   Wt 196 lb 12.8 oz (89.3 kg)   BMI 37.19 kg/m   Wt Readings from Last 3 Encounters:  12/04/17 196 lb 12.8 oz (89.3 kg)  11/01/17 204 lb (92.5 kg)  10/11/17 202 lb (91.6 kg)    Physical Exam  Constitutional: She is oriented to person, place, and time. She appears well-developed and well-nourished. No distress.  HENT:  Right Ear: Tympanic membrane, external ear and ear canal normal.  Left Ear: Tympanic membrane, external ear and ear canal normal.  Nose: Mucosal edema present. No epistaxis. Right sinus exhibits no maxillary sinus tenderness and no frontal sinus tenderness. Left sinus exhibits no maxillary sinus tenderness and no frontal sinus tenderness.  Mouth/Throat: Uvula is midline and mucous membranes are normal. Posterior oropharyngeal edema present. No oropharyngeal exudate, posterior oropharyngeal erythema or tonsillar abscesses.  Eyes: Conjunctivae are normal.  Cardiovascular: Normal rate, regular rhythm, normal heart sounds and intact distal pulses.  No murmur heard. Pulmonary/Chest: Effort normal and breath sounds normal. No respiratory distress. She has no wheezes. She has no rales.  Musculoskeletal: Normal range of motion. She exhibits no edema or tenderness.  Neurological: She is alert and oriented to person, place, and time. Coordination normal.  Skin: Skin is warm and dry. No rash noted. She is not diaphoretic.  Psychiatric: She has a normal mood and affect. Her behavior is normal.  Vitals reviewed.       Assessment & Plan:  Problem List Items Addressed This Visit    None    Visit Diagnoses    Bronchitis    -  Primary   Relevant Medications   predniSONE (DELTASONE) 20 MG tablet       Follow up plan: Return if symptoms worsen or fail to improve.  Counseling provided for all of the vaccine components No orders of the defined types were placed in this encounter.   Arville CareJoshua Jamon [place]hurst, MD [facility] 12/04/2017, 11:28  AM

## 2017-12-09 ENCOUNTER — Encounter: Payer: Self-pay | Admitting: *Deleted

## 2017-12-14 ENCOUNTER — Other Ambulatory Visit: Payer: Self-pay | Admitting: Physician Assistant

## 2017-12-14 DIAGNOSIS — M797 Fibromyalgia: Secondary | ICD-10-CM

## 2017-12-16 ENCOUNTER — Encounter: Payer: Self-pay | Admitting: Nurse Practitioner

## 2017-12-16 ENCOUNTER — Ambulatory Visit (INDEPENDENT_AMBULATORY_CARE_PROVIDER_SITE_OTHER): Payer: PPO | Admitting: Nurse Practitioner

## 2017-12-16 VITALS — BP 154/96 | HR 86 | Temp 97.0°F | Ht 61.0 in | Wt 199.0 lb

## 2017-12-16 DIAGNOSIS — J0100 Acute maxillary sinusitis, unspecified: Secondary | ICD-10-CM

## 2017-12-16 DIAGNOSIS — H66001 Acute suppurative otitis media without spontaneous rupture of ear drum, right ear: Secondary | ICD-10-CM | POA: Diagnosis not present

## 2017-12-16 MED ORDER — AMOXICILLIN-POT CLAVULANATE 875-125 MG PO TABS: 1.0000 | ORAL_TABLET | Freq: Two times a day (BID) | ORAL | 0 refills | Status: DC

## 2017-12-16 NOTE — Patient Instructions (Signed)

## 2017-12-16 NOTE — Progress Notes (Signed)
   Subjective:    Patient ID: Alicia Russo, female    DOB: 03/22/1956, 62 y.o.   MRN: [id number]  HPI  Patient brought in today with c/o congestion. Runny nose and sneezing. Started new years day. Denies fever even though she feels like she has one.   Review of Systems  Constitutional: Negative for chills and fever.  HENT: Positive for congestion and ear pain. Negative for sore throat and trouble swallowing.   Respiratory: Positive for cough. Negative for shortness of breath.   Cardiovascular: Negative.   Gastrointestinal: Negative.   Genitourinary: Negative.   Neurological: Negative.   Psychiatric/Behavioral: Negative.   All other systems reviewed and are negative.      Objective:   Physical Exam  Constitutional: She is oriented to person, place, and time. She appears well-developed and well-nourished. No distress.  HENT:  Right Ear: Hearing, external ear and ear canal normal. Tympanic membrane is erythematous. A middle ear effusion is present.  Left Ear: Hearing, external ear and ear canal normal. A middle ear effusion is present.  Nose: Mucosal edema and rhinorrhea present. Right sinus exhibits maxillary sinus tenderness. Right sinus exhibits no frontal sinus tenderness. Left sinus exhibits maxillary sinus tenderness. Left sinus exhibits no frontal sinus tenderness.  Mouth/Throat: Uvula is midline, oropharynx is clear and moist and mucous membranes are normal.  Neck: Normal range of motion.  Cardiovascular: Normal rate and regular rhythm.  Pulmonary/Chest: Effort normal and breath sounds normal. No respiratory distress. She has no wheezes. She has no rales. She exhibits no tenderness.  Lymphadenopathy:    She has cervical adenopathy (left tonsillar lymphadenopathy).  Neurological: She is alert and oriented to person, place, and time.  Skin: Skin is warm.  Psychiatric: She has a normal mood and affect. Her behavior is normal. Judgment and thought content normal.   BP (!)  154/96   Pulse 86   Temp (!) 97 F (36.1 C) (Oral)   Ht 5\' 1"  (1.549 m)   Wt 199 lb (90.3 kg)   SpO2 100%   BMI 37.60 kg/m       Assessment & Plan:  1. Acute maxillary sinusitis, recurrence not specified  2. Acute suppurative otitis media of right ear without spontaneous rupture of tympanic membrane, recurrence not specified 1. Take meds as prescribed 2. Use a cool mist humidifier especially during the winter months and when heat has been humid. 3. Use saline nose sprays frequently 4. Saline irrigations of the nose can be very helpful if done frequently.  * 4X daily for 1 week*  * Use of a nettie pot can be helpful with this. Follow directions with this* 5. Drink plenty of fluids 6. Keep thermostat turn down low 7.For any cough or congestion  Use plain Mucinex- regular strength or max strength is fine   * Children- consult with Pharmacist for dosing 8. For fever or aces or pains- take tylenol or ibuprofen appropriate for age and weight.  * for fevers greater than 101 orally you may alternate ibuprofen and tylenol every  3 hours.   Meds ordered this encounter  Medications  . amoxicillin-clavulanate (AUGMENTIN) 875-125 MG tablet    Sig: Take 1 tablet by mouth 2 (two) times daily.    Dispense:  20 tablet    Refill:  0    Order Specific Question:   Supervising Provider    Answer:   Johna SheriffVINCENT, CAROL L [[id number]]   Mary-Margaret Daphine DeutscherMartin, FNP

## 2017-12-30 DIAGNOSIS — R310 Gross hematuria: Secondary | ICD-10-CM | POA: Diagnosis not present

## 2017-12-30 DIAGNOSIS — N200 Calculus of kidney: Secondary | ICD-10-CM | POA: Diagnosis not present

## 2018-01-03 ENCOUNTER — Other Ambulatory Visit: Payer: Self-pay | Admitting: Physician Assistant

## 2018-01-06 DIAGNOSIS — R310 Gross hematuria: Secondary | ICD-10-CM | POA: Diagnosis not present

## 2018-01-12 ENCOUNTER — Other Ambulatory Visit: Payer: Self-pay | Admitting: Physician Assistant

## 2018-01-12 DIAGNOSIS — M797 Fibromyalgia: Secondary | ICD-10-CM

## 2018-01-13 ENCOUNTER — Other Ambulatory Visit: Payer: Self-pay | Admitting: Physician Assistant

## 2018-02-04 ENCOUNTER — Ambulatory Visit (INDEPENDENT_AMBULATORY_CARE_PROVIDER_SITE_OTHER): Payer: PPO | Admitting: Physician Assistant

## 2018-02-04 ENCOUNTER — Encounter: Payer: Self-pay | Admitting: Physician Assistant

## 2018-02-04 VITALS — BP 144/95 | HR 84 | Temp 97.8°F | Ht 61.0 in | Wt 200.6 lb

## 2018-02-04 DIAGNOSIS — J0100 Acute maxillary sinusitis, unspecified: Secondary | ICD-10-CM

## 2018-02-04 MED ORDER — AMOXICILLIN-POT CLAVULANATE 875-125 MG PO TABS: 1.0000 | ORAL_TABLET | Freq: Two times a day (BID) | ORAL | 0 refills | Status: DC

## 2018-02-04 MED ORDER — METHYLPREDNISOLONE ACETATE 80 MG/ML IJ SUSP
80.0000 mg | Freq: Once | INTRAMUSCULAR | Status: AC
  Administered 2018-02-04: 80 mg via INTRAMUSCULAR

## 2018-02-04 MED ORDER — HYDROCOD POLST-CPM POLST ER 10-8 MG/5ML PO SUER: 5.0000 mL | Freq: Two times a day (BID) | ORAL | 0 refills | Status: DC | PRN

## 2018-02-04 MED ORDER — HYDROCOD POLST-CPM POLST ER 10-8 MG/5ML PO SUER
5.0000 mL | Freq: Two times a day (BID) | ORAL | 0 refills | Status: DC | PRN
Start: 2018-02-04 — End: 2019-09-11

## 2018-02-04 NOTE — Patient Instructions (Signed)
In a few days you may receive a survey in the mail or online from [employer] regarding your visit with us today. Please take a moment to fill this out. Your feedback is very important to our whole office. It can help us better understand your needs as well as improve your experience and satisfaction. Thank you for taking your time to complete it. We care about you.  Jersey Espinoza, PA-C  

## 2018-02-04 NOTE — Progress Notes (Signed)
BP (!) 144/95   Pulse 84   Temp 97.8 F (36.6 C) (Oral)   Ht 5\' 1"  (1.549 m)   Wt 200 lb 9.6 oz (91 kg)   BMI 37.90 kg/m    Subjective:    Patient ID: Alicia Russo, female    DOB: 23-Jun-1956, 62 y.o.   MRN: [id number]  HPI: Alicia Russo is a 62 y.o. female presenting on 02/04/2018 for Facial Pain; Ear Pain; and Headache This patient has had many days of sinus headache and postnasal drainage. There is copious drainage at times. Denies any fever at this time. There has been a history of sinus infections in the past.  No history of sinus surgery. There is cough at night. It has become more prevalent in recent days.  Past Medical History:  Diagnosis Date  . Anxiety   . Asthma   . AVM (arteriovenous malformation)    Intracranial with seizures  . Closed fracture of left distal radius   . Fibromyalgia   . GERD (gastroesophageal reflux disease)   . Hyperlipidemia   . Hypothyroidism   . Seizures (HCC)    AVM repair   Relevant past medical, surgical, family and social history reviewed and updated as indicated. Interim medical history since our last visit reviewed. Allergies and medications reviewed and updated. DATA REVIEWED: CHART IN EPIC  Family History reviewed for pertinent findings.  Review of Systems  Constitutional: Positive for chills and fatigue. Negative for activity change and appetite change.  HENT: Positive for congestion, postnasal drip and sore throat.   Eyes: Negative.   Respiratory: Positive for cough and wheezing.   Cardiovascular: Negative.  Negative for chest pain, palpitations and leg swelling.  Gastrointestinal: Negative.   Genitourinary: Negative.   Musculoskeletal: Negative.   Skin: Negative.   Neurological: Positive for headaches.    Allergies as of 02/04/2018      Reactions   Bee Venom    Cant breath, swell up   Cherry Flavor Hives     dizziness   Citalopram Hydrobromide    Mood swings, gets "really really mean"   Tramadol    unknown        Medication List        Accurate as of 02/04/18 11:33 AM. Always use your most recent med list.          albuterol 108 (90 Base) MCG/ACT inhaler Commonly known as:  PROVENTIL HFA;VENTOLIN HFA Inhale 2 puffs into the lungs every 6 (six) hours as needed for wheezing or shortness of breath.   albuterol (2.5 MG/3ML) 0.083% nebulizer solution Commonly known as:  PROVENTIL Take 3 mLs (2.5 mg total) by nebulization every 6 (six) hours as needed for wheezing or shortness of breath.   ALPRAZolam 0.5 MG tablet Commonly known as:  XANAX Take 1 tablet (0.5 mg total) by mouth 2 (two) times daily as needed.   amoxicillin-clavulanate 875-125 MG tablet Commonly known as:  AUGMENTIN Take 1 tablet by mouth 2 (two) times daily.   aspirin 81 MG EC tablet Take 81 mg by mouth 2 (two) times daily.   Biotin 10 MG Tabs Take 1 tablet by mouth 2 (two) times daily.   cetirizine 10 MG tablet Commonly known as:  ZYRTEC Take 10 mg by mouth daily.   chlorpheniramine-HYDROcodone 10-8 MG/5ML Suer Commonly known as:  TUSSIONEX Take 5 mLs by mouth every 12 (twelve) hours as needed for cough.   fluticasone 50 MCG/ACT nasal spray Commonly known as:  FLONASE USE TWO  SPRAYS IN EACH NOSTRIL TWICE DAILY   ibuprofen 800 MG tablet Commonly known as:  ADVIL,MOTRIN TAKE ONE TABLET BY MOUTH THREE TIMES DAILY   levothyroxine 137 MCG tablet Commonly known as:  SYNTHROID, LEVOTHROID TAKE 1 TABLET BY MOUTH EVERY DAY   losartan 50 MG tablet Commonly known as:  COZAAR Take 0.5 tablets (25 mg total) by mouth daily.   magnesium 30 MG tablet Take 30 mg by mouth 2 (two) times daily.   methocarbamol 500 MG tablet Commonly known as:  ROBAXIN TAKE ONE TABLET BY MOUTH EVERY 6 HOURS AS NEEDED FOR MUSCLE SPASMS   montelukast 10 MG tablet Commonly known as:  SINGULAIR TAKE 1 TABLET BY MOUTH EVERY EVENING   multivitamin tablet Take 1 tablet by mouth daily.   omeprazole 20 MG capsule Commonly known as:   PRILOSEC TAKE ONE CAPSULE BY MOUTH EVERY DAY   SAVELLA 100 MG Tabs tablet Generic drug:  Milnacipran HCl TAKE ONE TABLET BY MOUTH TWICE DAILY   SUPER B COMPLEX/C PO Take 1 tablet by mouth 2 (two) times daily.   SYMBICORT 80-4.5 MCG/ACT inhaler Generic drug:  budesonide-formoterol INHALE ONE PUFF INTO LUNCGS TWICE DAILY   Teriparatide (Recombinant) 600 MCG/2.4ML Soln Commonly known as:  FORTEO Inject 0.08 mLs (20 mcg total) into the skin daily.   VITAMIN B-12 PO Take 1 tablet by mouth 2 (two) times daily.   Vitamin D3 2000 units Tabs Take 1 tablet by mouth 2 (two) times daily.   zinc gluconate 50 MG tablet Take 50 mg by mouth daily.          Objective:    BP (!) 144/95   Pulse 84   Temp 97.8 F (36.6 C) (Oral)   Ht 5\' 1"  (1.549 m)   Wt 200 lb 9.6 oz (91 kg)   BMI 37.90 kg/m   Allergies  Allergen Reactions  . Bee Venom     Cant breath, swell up  . Cherry Flavor Hives      dizziness  . Citalopram Hydrobromide     Mood swings, gets "really really mean"  . Tramadol     unknown    Wt Readings from Last 3 Encounters:  02/04/18 200 lb 9.6 oz (91 kg)  12/16/17 199 lb (90.3 kg)  12/04/17 196 lb 12.8 oz (89.3 kg)    Physical Exam  Constitutional: She is oriented to person, place, and time. She appears well-developed and well-nourished.  HENT:  Head: Normocephalic and atraumatic.  Right Ear: Tympanic membrane and external ear normal. No middle ear effusion.  Left Ear: Tympanic membrane and external ear normal.  No middle ear effusion.  Nose: Mucosal edema and rhinorrhea present. Right sinus exhibits no maxillary sinus tenderness. Left sinus exhibits no maxillary sinus tenderness.  Mouth/Throat: Uvula is midline. Posterior oropharyngeal erythema present.  Eyes: Conjunctivae and EOM are normal. Pupils are equal, round, and reactive to light. Right eye exhibits no discharge. Left eye exhibits no discharge.  Neck: Normal range of motion.  Cardiovascular: Normal  rate, regular rhythm and normal heart sounds.  Pulmonary/Chest: Effort normal and breath sounds normal. No respiratory distress. She has no wheezes.  Abdominal: Soft.  Lymphadenopathy:    She has no cervical adenopathy.  Neurological: She is alert and oriented to person, place, and time.  Skin: Skin is warm and dry.  Psychiatric: She has a normal mood and affect.        Assessment & Plan:   1. Acute non-recurrent maxillary sinusitis - amoxicillin-clavulanate (AUGMENTIN) 875-125  MG tablet; Take 1 tablet by mouth 2 (two) times daily.  Dispense: 20 tablet; Refill: 0 - methylPREDNISolone acetate (DEPO-MEDROL) injection 80 mg - chlorpheniramine-HYDROcodone (TUSSIONEX) 10-8 MG/5ML SUER; Take 5 mLs by mouth every 12 (twelve) hours as needed for cough.  Dispense: 120 mL; Refill: 0   Continue all other maintenance medications as listed above.  Follow up plan: No Follow-up on file.  Educational handout given for *survey **  Remus LofflerAngel S. Cayli Escajeda PA-C [facility] [address]  [place], [place] [id number] [phone]   02/04/2018, 11:33 AM

## 2018-02-12 ENCOUNTER — Other Ambulatory Visit: Payer: Self-pay | Admitting: Physician Assistant

## 2018-02-12 DIAGNOSIS — M797 Fibromyalgia: Secondary | ICD-10-CM

## 2018-02-17 ENCOUNTER — Other Ambulatory Visit: Payer: Self-pay | Admitting: Physician Assistant

## 2018-02-17 DIAGNOSIS — J0100 Acute maxillary sinusitis, unspecified: Secondary | ICD-10-CM

## 2018-02-17 MED ORDER — CEFDINIR 300 MG PO CAPS: 300.0000 mg | ORAL_CAPSULE | Freq: Two times a day (BID) | ORAL | 0 refills | Status: DC

## 2018-02-17 NOTE — Telephone Encounter (Signed)
Pt with recurrent sinusitis. Change augmentin to Alicia Russo  Sam Kolbi Tofte, MD [facility] 02/17/2018, 2:46 PM

## 2018-02-17 NOTE — Telephone Encounter (Signed)
Came in 2 weeks ago - seen Jones,  Sinus and HA and facial pressure. Took all augmentin  - helped for awhile = NOW today ALL is right back to the same way.  [facility].  (zpak always works well)

## 2018-02-28 ENCOUNTER — Other Ambulatory Visit: Payer: Self-pay | Admitting: Physician Assistant

## 2018-03-23 ENCOUNTER — Encounter: Payer: Self-pay | Admitting: Family Medicine

## 2018-03-23 ENCOUNTER — Ambulatory Visit (INDEPENDENT_AMBULATORY_CARE_PROVIDER_SITE_OTHER): Payer: PPO | Admitting: Family Medicine

## 2018-03-23 VITALS — BP 126/78 | Temp 96.9°F | Ht 61.0 in | Wt 201.2 lb

## 2018-03-23 DIAGNOSIS — J40 Bronchitis, not specified as acute or chronic: Secondary | ICD-10-CM | POA: Diagnosis not present

## 2018-03-23 DIAGNOSIS — H40033 Anatomical narrow angle, bilateral: Secondary | ICD-10-CM | POA: Diagnosis not present

## 2018-03-23 DIAGNOSIS — H2513 Age-related nuclear cataract, bilateral: Secondary | ICD-10-CM | POA: Diagnosis not present

## 2018-03-23 DIAGNOSIS — J329 Chronic sinusitis, unspecified: Secondary | ICD-10-CM

## 2018-03-23 MED ORDER — AMOXICILLIN-POT CLAVULANATE 875-125 MG PO TABS: 1.0000 | ORAL_TABLET | Freq: Two times a day (BID) | ORAL | 0 refills | Status: DC

## 2018-03-23 NOTE — Progress Notes (Addendum)
Chief Complaint  Patient presents with  . Sore Throat    pt here today c/o sore throat and ear pain    HPI  Patient presents today for  Patient presents with upper respiratory congestion. She has maxillary pressure and headache.. The right ear is painful.  There is moderate sore throat.  There is postnasal drainage.  Patient reports coughing a little hacky cough as well.  . There is no fever, chills or sweats. The patient denies being short of breath. Onset was on awakening today.  PMH: Smoking status noted ROS: Per HPI  Objective: BP 126/78   Temp (!) 96.9 F (36.1 C) (Oral)   Ht 5\' 1"  (1.549 m)   Wt 201 lb 4 oz (91.3 kg)   BMI 38.03 kg/m  Gen: NAD, alert, cooperative with exam there is a air-fluid meniscus at the right TM posteriorly and inferiorly there is tenderness to percussion of the right maxillary sinus and swelling with exudate in the nasal passages. HEENT: NCAT, EOMI, PERRL CV: RRR, good S1/S2, no murmur Resp: Mild bronchitic changes noted Ext: No edema, warm Neuro: Alert and oriented, No gross deficits  Assessment and plan:  1. Sinobronchitis     Meds ordered this encounter  Medications  . amoxicillin-clavulanate (AUGMENTIN) 875-125 MG tablet    Sig: Take 1 tablet by mouth 2 (two) times daily.    Dispense:  20 tablet    Refill:  0      Follow up as needed.  Mechele ClaudeWarren Derreck Wiltsey, MD

## 2018-04-11 ENCOUNTER — Ambulatory Visit: Payer: PPO | Admitting: Physician Assistant

## 2018-04-12 ENCOUNTER — Ambulatory Visit (INDEPENDENT_AMBULATORY_CARE_PROVIDER_SITE_OTHER): Payer: PPO | Admitting: Physician Assistant

## 2018-04-12 ENCOUNTER — Encounter: Payer: Self-pay | Admitting: Physician Assistant

## 2018-04-12 VITALS — BP 140/89 | HR 73 | Temp 97.4°F | Ht 61.0 in | Wt 201.0 lb

## 2018-04-12 DIAGNOSIS — M797 Fibromyalgia: Secondary | ICD-10-CM

## 2018-04-12 DIAGNOSIS — I10 Essential (primary) hypertension: Secondary | ICD-10-CM

## 2018-04-12 DIAGNOSIS — E039 Hypothyroidism, unspecified: Secondary | ICD-10-CM | POA: Diagnosis not present

## 2018-04-12 DIAGNOSIS — K219 Gastro-esophageal reflux disease without esophagitis: Secondary | ICD-10-CM | POA: Diagnosis not present

## 2018-04-12 MED ORDER — FLUCONAZOLE 150 MG PO TABS: ORAL_TABLET | ORAL | 0 refills | Status: DC

## 2018-04-12 MED ORDER — HYDROCODONE-ACETAMINOPHEN 10-325 MG PO TABS: 1.0000 | ORAL_TABLET | ORAL | 0 refills | Status: DC | PRN

## 2018-04-12 MED ORDER — LOSARTAN POTASSIUM 50 MG PO TABS: 50.0000 mg | ORAL_TABLET | Freq: Every day | ORAL | 3 refills | Status: DC

## 2018-04-12 NOTE — Patient Instructions (Signed)
In a few days you may receive a survey in the mail or online from [employer] regarding your visit with us today. Please take a moment to fill this out. Your feedback is very important to our whole office. It can help us better understand your needs as well as improve your experience and satisfaction. Thank you for taking your time to complete it. We care about you.  Dashaun Onstott, PA-C  

## 2018-04-12 NOTE — Progress Notes (Signed)
BP 140/89   Pulse 73   Temp (!) 97.4 F (36.3 C) (Oral)   Ht  (1.549 m)   Wt 201 lb (91.2 kg)   BMI 37.98 kg/m    Subjective:    Patient ID: Alicia Russo, female    DOB: 23-Feb-1956, 62 y.o.   MRN: [id number]  HPI: Alicia Russo is a 62 y.o. female presenting on 04/12/2018 for Follow-up (6 month )  Patient comes in for a 69-month recheck.  She is currently having a 5 myalgia flare.  Her blood pressure has been slightly up.  She has been trying to check it regularly.  She reports that she does need some refills on her medications.  She recently had a bronchitis it took her a while to get through which she is finally feeling better.  Past Medical History:  Diagnosis Date  . Anxiety   . Asthma   . AVM (arteriovenous malformation)    Intracranial with seizures  . Closed fracture of left distal radius   . Fibromyalgia   . GERD (gastroesophageal reflux disease)   . Hyperlipidemia   . Hypothyroidism   . Seizures (HCC)    AVM repair   Relevant past medical, surgical, family and social history reviewed and updated as indicated. Interim medical history since our last visit reviewed. Allergies and medications reviewed and updated. DATA REVIEWED: CHART IN EPIC  Family History reviewed for pertinent findings.  Review of Systems  Constitutional: Negative.   HENT: Negative.   Eyes: Negative.   Respiratory: Negative.   Gastrointestinal: Negative.   Genitourinary: Negative.     Allergies as of 04/12/2018      Reactions   Bee Venom    Cant breath, swell up   Cherry Flavor Hives     dizziness   Citalopram Hydrobromide    Mood swings, gets "really really mean"   Tramadol    unknown      Medication List        Accurate as of 04/12/18 11:06 PM. Always use your most recent med list.          albuterol 108 (90 Base) MCG/ACT inhaler Commonly known as:  PROVENTIL HFA;VENTOLIN HFA Inhale 2 puffs into the lungs every 6 (six) hours as needed for wheezing or shortness  of breath.   albuterol (2.5 MG/3ML) 0.083% nebulizer solution Commonly known as:  PROVENTIL Take 3 mLs (2.5 mg total) by nebulization every 6 (six) hours as needed for wheezing or shortness of breath.   ALPRAZolam 0.5 MG tablet Commonly known as:  XANAX Take 1 tablet (0.5 mg total) by mouth 2 (two) times daily as needed.   aspirin 81 MG EC tablet Take 81 mg by mouth 2 (two) times daily.   Biotin 10 MG Tabs Take 1 tablet by mouth 2 (two) times daily.   cetirizine 10 MG tablet Commonly known as:  ZYRTEC Take 10 mg by mouth daily.   chlorpheniramine-HYDROcodone 10-8 MG/5ML Suer Commonly known as:  TUSSIONEX Take 5 mLs by mouth every 12 (twelve) hours as needed for cough.   fluconazole 150 MG tablet Commonly known as:  DIFLUCAN 1 po q week x 4 weeks   fluticasone 50 MCG/ACT nasal spray Commonly known as:  FLONASE USE TWO SPRAYS IN EACH NOSTRIL TWICE DAILY   HYDROcodone-acetaminophen 10-325 MG tablet Commonly known as:  NORCO Take 1 tablet by mouth every 4 (four) hours as needed.   ibuprofen 800 MG tablet Commonly known as:  ADVIL,MOTRIN  TAKE ONE TABLET BY MOUTH THREE TIMES DAILY   levothyroxine 137 MCG tablet Commonly known as:  SYNTHROID, LEVOTHROID TAKE 1 TABLET BY MOUTH EVERY DAY   losartan 50 MG tablet Commonly known as:  COZAAR Take 1 tablet (50 mg total) by mouth daily.   magnesium 30 MG tablet Take 30 mg by mouth 2 (two) times daily.   methocarbamol 500 MG tablet Commonly known as:  ROBAXIN TAKE ONE TABLET BY MOUTH EVERY 6 HOURS AS NEEDED FOR MUSCLE SPASMS   montelukast 10 MG tablet Commonly known as:  SINGULAIR TAKE 1 TABLET BY MOUTH EVERY EVENING   multivitamin tablet Take 1 tablet by mouth daily.   omeprazole 20 MG capsule Commonly known as:  PRILOSEC TAKE ONE CAPSULE BY MOUTH EVERY DAY   SAVELLA 100 MG Tabs tablet Generic drug:  Milnacipran HCl TAKE ONE TABLET BY MOUTH TWICE DAILY   SUPER B COMPLEX/C PO Take 1 tablet by mouth 2 (two) times  daily.   SYMBICORT 80-4.5 MCG/ACT inhaler Generic drug:  budesonide-formoterol INHALE ONE PUFF INTO LUNCGS TWICE DAILY   VITAMIN B-12 PO Take 1 tablet by mouth 2 (two) times daily.   Vitamin D3 2000 units Tabs Take 1 tablet by mouth 2 (two) times daily.   zinc gluconate 50 MG tablet Take 50 mg by mouth daily.          Objective:    BP 140/89   Pulse 73   Temp (!) 97.4 F (36.3 C) (Oral)   Ht  (1.549 m)   Wt 201 lb (91.2 kg)   BMI 37.98 kg/m   Allergies  Allergen Reactions  . Bee Venom     Cant breath, swell up  . Cherry Flavor Hives      dizziness  . Citalopram Hydrobromide     Mood swings, gets "really really mean"  . Tramadol     unknown    Wt Readings from Last 3 Encounters:  04/12/18 201 lb (91.2 kg)  03/23/18 201 lb 4 oz (91.3 kg)  02/04/18 200 lb 9.6 oz (91 kg)    Physical Exam  Constitutional: She is oriented to person, place, and time. She appears well-developed and well-nourished.  HENT:  Head: Normocephalic and atraumatic.  Eyes: Pupils are equal, round, and reactive to light. Conjunctivae and EOM are normal.  Cardiovascular: Normal rate, regular rhythm, normal heart sounds and intact distal pulses.  Pulmonary/Chest: Effort normal and breath sounds normal.  Abdominal: Soft. Bowel sounds are normal.  Neurological: She is alert and oriented to person, place, and time. She has normal reflexes.  Skin: Skin is warm and dry. No rash noted.  Psychiatric: She has a normal mood and affect. Her behavior is normal. Judgment and thought content normal.        Assessment & Plan:   1. Essential hypertension - losartan (COZAAR) 50 MG tablet; Take 1 tablet (50 mg total) by mouth daily.  Dispense: 90 tablet; Refill: 3  2. Gastroesophageal reflux disease without esophagitis  3. Fibromyalgia - HYDROcodone-acetaminophen (NORCO) 10-325 MG tablet; Take 1 tablet by mouth every 4 (four) hours as needed.  Dispense: 40 tablet; Refill: 0  4. Hypothyroidism,  unspecified type   Continue all other maintenance medications as listed above.  Follow up plan: Return in about 6 months (around 10/12/2018).  Educational handout given for survey  Remus Loffler PA-C [facility] [address]  [place], [place] [address] [phone]   04/12/2018, 11:06 PM

## 2018-05-01 ENCOUNTER — Other Ambulatory Visit: Payer: Self-pay | Admitting: Physician Assistant

## 2018-05-01 DIAGNOSIS — M797 Fibromyalgia: Secondary | ICD-10-CM

## 2018-05-02 NOTE — Telephone Encounter (Signed)
Last seen 04/12/18  Angel 

## 2018-06-06 ENCOUNTER — Other Ambulatory Visit: Payer: Self-pay | Admitting: Physician Assistant

## 2018-06-30 ENCOUNTER — Other Ambulatory Visit: Payer: Self-pay | Admitting: Physician Assistant

## 2018-07-04 ENCOUNTER — Ambulatory Visit (INDEPENDENT_AMBULATORY_CARE_PROVIDER_SITE_OTHER): Payer: PPO | Admitting: Physician Assistant

## 2018-07-04 ENCOUNTER — Encounter: Payer: Self-pay | Admitting: Physician Assistant

## 2018-07-04 VITALS — BP 132/83 | HR 89 | Temp 97.9°F | Ht 61.0 in | Wt 202.4 lb

## 2018-07-04 DIAGNOSIS — J0100 Acute maxillary sinusitis, unspecified: Secondary | ICD-10-CM

## 2018-07-04 MED ORDER — FLUCONAZOLE 150 MG PO TABS
ORAL_TABLET | ORAL | 0 refills | Status: DC
Start: 2018-07-04 — End: 2018-07-19

## 2018-07-04 MED ORDER — AMOXICILLIN-POT CLAVULANATE 875-125 MG PO TABS: 1.0000 | ORAL_TABLET | Freq: Two times a day (BID) | ORAL | 0 refills | Status: DC

## 2018-07-05 NOTE — Progress Notes (Signed)
BP 132/83   Pulse 89   Temp 97.9 F (36.6 C) (Oral)   Ht 5\' 1"  (1.549 m)   Wt 202 lb 6.4 oz (91.8 kg)   BMI 38.24 kg/m    Subjective:    Patient ID: Alicia Russo, female    DOB: 06/25/1956, 62 y.o.   MRN: [id number]  HPI: Alicia Russo is a 62 y.o. female presenting on 07/04/2018 for Sinusitis and Ear Pain  This patient has had many days of sinus headache and postnasal drainage. There is copious drainage at times. Denies any fever at this time. There has been a history of sinus infections in the past.  No history of sinus surgery. There is cough at night. It has become more prevalent in recent days.   Past Medical History:  Diagnosis Date  . Anxiety   . Asthma   . AVM (arteriovenous malformation)    Intracranial with seizures  . Closed fracture of left distal radius   . Fibromyalgia   . GERD (gastroesophageal reflux disease)   . Hyperlipidemia   . Hypothyroidism   . Seizures (HCC)    AVM repair   Relevant past medical, surgical, family and social history reviewed and updated as indicated. Interim medical history since our last visit reviewed. Allergies and medications reviewed and updated. DATA REVIEWED: CHART IN EPIC  Family History reviewed for pertinent findings.  Review of Systems  Constitutional: Positive for chills and fatigue. Negative for activity change and appetite change.  HENT: Positive for congestion, postnasal drip and sore throat.   Eyes: Negative.   Respiratory: Positive for cough and wheezing.   Cardiovascular: Negative.  Negative for chest pain, palpitations and leg swelling.  Gastrointestinal: Negative.   Genitourinary: Negative.   Musculoskeletal: Negative.   Skin: Negative.   Neurological: Positive for headaches.    Allergies as of 07/04/2018      Reactions   Bee Venom    Cant breath, swell up   Cherry Flavor Hives     dizziness   Citalopram Hydrobromide    Mood swings, gets "really really mean"   Tramadol    unknown        Medication List        Accurate as of 07/04/18 11:59 PM. Always use your most recent med list.          albuterol 108 (90 Base) MCG/ACT inhaler Commonly known as:  PROVENTIL HFA;VENTOLIN HFA Inhale 2 puffs into the lungs every 6 (six) hours as needed for wheezing or shortness of breath.   albuterol (2.5 MG/3ML) 0.083% nebulizer solution Commonly known as:  PROVENTIL Take 3 mLs (2.5 mg total) by nebulization every 6 (six) hours as needed for wheezing or shortness of breath.   ALPRAZolam 0.5 MG tablet Commonly known as:  XANAX Take 1 tablet (0.5 mg total) by mouth 2 (two) times daily as needed.   amoxicillin-clavulanate 875-125 MG tablet Commonly known as:  AUGMENTIN Take 1 tablet by mouth 2 (two) times daily.   aspirin 81 MG EC tablet Take 81 mg by mouth 2 (two) times daily.   Biotin 10 MG Tabs Take 1 tablet by mouth 2 (two) times daily.   cetirizine 10 MG tablet Commonly known as:  ZYRTEC Take 10 mg by mouth daily.   chlorpheniramine-HYDROcodone 10-8 MG/5ML Suer Commonly known as:  TUSSIONEX Take 5 mLs by mouth every 12 (twelve) hours as needed for cough.   fluconazole 150 MG tablet Commonly known as:  DIFLUCAN 1 po q week  x 4 weeks   fluticasone 50 MCG/ACT nasal spray Commonly known as:  FLONASE USE TWO SPRAYS IN EACH NOSTRIL TWICE DAILY   ibuprofen 800 MG tablet Commonly known as:  ADVIL,MOTRIN TAKE ONE TABLET BY MOUTH THREE TIMES DAILY   levothyroxine 137 MCG tablet Commonly known as:  SYNTHROID, LEVOTHROID TAKE 1 TABLET BY MOUTH EVERY DAY   losartan 50 MG tablet Commonly known as:  COZAAR Take 1 tablet (50 mg total) by mouth daily.   magnesium 30 MG tablet Take 30 mg by mouth 2 (two) times daily.   methocarbamol 500 MG tablet Commonly known as:  ROBAXIN TAKE ONE TABLET BY MOUTH EVERY 6 HOURS AS NEEDED FOR MUSCLE SPASMS   montelukast 10 MG tablet Commonly known as:  SINGULAIR TAKE 1 TABLET BY MOUTH EVERY EVENING   multivitamin tablet Take 1  tablet by mouth daily.   omeprazole 20 MG capsule Commonly known as:  PRILOSEC TAKE ONE CAPSULE BY MOUTH EVERY DAY   SAVELLA 100 MG Tabs tablet Generic drug:  Milnacipran HCl TAKE ONE TABLET BY MOUTH TWICE DAILY   SUPER B COMPLEX/C PO Take 1 tablet by mouth 2 (two) times daily.   SYMBICORT 80-4.5 MCG/ACT inhaler Generic drug:  budesonide-formoterol INHALE ONE PUFF INTO LUNCGS TWICE DAILY   VITAMIN B-12 PO Take 1 tablet by mouth 2 (two) times daily.   Vitamin D3 2000 units Tabs Take 1 tablet by mouth 2 (two) times daily.   zinc gluconate 50 MG tablet Take 50 mg by mouth daily.          Objective:    BP 132/83   Pulse 89   Temp 97.9 F (36.6 C) (Oral)   Ht 5\' 1"  (1.549 m)   Wt 202 lb 6.4 oz (91.8 kg)   BMI 38.24 kg/m   Allergies  Allergen Reactions  . Bee Venom     Cant breath, swell up  . Cherry Flavor Hives      dizziness  . Citalopram Hydrobromide     Mood swings, gets "really really mean"  . Tramadol     unknown    Wt Readings from Last 3 Encounters:  07/04/18 202 lb 6.4 oz (91.8 kg)  04/12/18 201 lb (91.2 kg)  03/23/18 201 lb 4 oz (91.3 kg)    Physical Exam  Constitutional: She is oriented to person, place, and time. She appears well-developed and well-nourished.  HENT:  Head: Normocephalic and atraumatic.  Right Ear: Tympanic membrane and external ear normal. No middle ear effusion.  Left Ear: Tympanic membrane and external ear normal.  No middle ear effusion.  Nose: Mucosal edema and rhinorrhea present. Right sinus exhibits no maxillary sinus tenderness. Left sinus exhibits no maxillary sinus tenderness.  Mouth/Throat: Uvula is midline. Posterior oropharyngeal erythema present.  Eyes: Pupils are equal, round, and reactive to light. Conjunctivae and EOM are normal. Right eye exhibits no discharge. Left eye exhibits no discharge.  Neck: Normal range of motion.  Cardiovascular: Normal rate, regular rhythm and normal heart sounds.  Pulmonary/Chest:  Effort normal and breath sounds normal. No respiratory distress. She has no wheezes.  Abdominal: Soft.  Lymphadenopathy:    She has no cervical adenopathy.  Neurological: She is alert and oriented to person, place, and time.  Skin: Skin is warm and dry.  Psychiatric: She has a normal mood and affect.        Assessment & Plan:   1. Acute non-recurrent maxillary sinusitis - amoxicillin-clavulanate (AUGMENTIN) 875-125 MG tablet; Take 1 tablet by  mouth 2 (two) times daily.  Dispense: 20 tablet; Refill: 0 - fluconazole (DIFLUCAN) 150 MG tablet; 1 po q week x 4 weeks  Dispense: 4 tablet; Refill: 0   Continue all other maintenance medications as listed above.  Follow up plan: No follow-ups on file.  Educational handout given for survey  Remus Loffler PA-C [facility] [address]  [place], [place] [address] [phone]   07/05/2018, 12:36 PM

## 2018-07-19 ENCOUNTER — Ambulatory Visit (INDEPENDENT_AMBULATORY_CARE_PROVIDER_SITE_OTHER): Payer: PPO | Admitting: Family Medicine

## 2018-07-19 ENCOUNTER — Encounter: Payer: Self-pay | Admitting: Family Medicine

## 2018-07-19 VITALS — BP 119/76 | HR 77 | Temp 97.3°F | Ht 61.0 in | Wt 200.0 lb

## 2018-07-19 DIAGNOSIS — J0100 Acute maxillary sinusitis, unspecified: Secondary | ICD-10-CM | POA: Diagnosis not present

## 2018-07-19 MED ORDER — CEFDINIR 300 MG PO CAPS: 300.0000 mg | ORAL_CAPSULE | Freq: Two times a day (BID) | ORAL | 0 refills | Status: DC

## 2018-07-19 MED ORDER — AZELASTINE HCL 0.1 % NA SOLN: 2.0000 | Freq: Two times a day (BID) | NASAL | 12 refills | Status: DC

## 2018-07-19 NOTE — Progress Notes (Signed)
Subjective: CC: sinus infection  PCP: Remus LofflerJones, Angel S, PA-C ZOX:WRUEHPI:Alicia [place] Russo is a 62 y.o. female presenting to clinic today for:  1. Sinusitis Patient reports that she was recently treated for sinus infection on 07/04/2018.  She notes completion of Augmentin but feels that symptoms have actually worsened.  She describes increased facial pressure, sinus headache.  She denies any fevers, purulence from nares.  She notes that when she turns her head side to side she can feel sloshing within the maxillary sinuses.  She has been using oral antihistamine at night, Singulair, Flonase and sinus rinses with tap water with little improvement in symptoms.  She does have recurrent sinus infections.  She has never seen an ear nose and throat doctor.   ROS: Per HPI  Allergies  Allergen Reactions  . Bee Venom     Cant breath, swell up  . Cherry Flavor Hives      dizziness  . Citalopram Hydrobromide     Mood swings, gets "really really mean"  . Tramadol     unknown   Past Medical History:  Diagnosis Date  . Anxiety   . Asthma   . AVM (arteriovenous malformation)    Intracranial with seizures  . Closed fracture of left distal radius   . Fibromyalgia   . GERD (gastroesophageal reflux disease)   . Hyperlipidemia   . Hypothyroidism   . Seizures (HCC)    AVM repair    Current Outpatient Medications:  .  albuterol (PROVENTIL HFA;VENTOLIN HFA) 108 (90 Base) MCG/ACT inhaler, Inhale 2 puffs into the lungs every 6 (six) hours as needed for wheezing or shortness of breath., Disp: 1 Inhaler, Rfl: 6 .  albuterol (PROVENTIL) (2.5 MG/3ML) 0.083% nebulizer solution, Take 3 mLs (2.5 mg total) by nebulization every 6 (six) hours as needed for wheezing or shortness of breath., Disp: 75 mL, Rfl: 3 .  ALPRAZolam (XANAX) 0.5 MG tablet, Take 1 tablet (0.5 mg total) by mouth 2 (two) times daily as needed., Disp: 60 tablet, Rfl: 5 .  aspirin 81 MG EC tablet, Take 81 mg by mouth 2 (two) times daily. , Disp: ,  Rfl:  .  Biotin 10 MG TABS, Take 1 tablet by mouth 2 (two) times daily. , Disp: , Rfl:  .  cetirizine (ZYRTEC) 10 MG tablet, Take 10 mg by mouth daily., Disp: , Rfl:  .  Cholecalciferol (VITAMIN D3) 2000 UNITS TABS, Take 1 tablet by mouth 2 (two) times daily., Disp: , Rfl:  .  Cyanocobalamin (VITAMIN B-12 PO), Take 1 tablet by mouth 2 (two) times daily., Disp: , Rfl:  .  fluticasone (FLONASE) 50 MCG/ACT nasal spray, USE TWO SPRAYS IN EACH NOSTRIL TWICE DAILY, Disp: 16 g, Rfl: 2 .  ibuprofen (ADVIL,MOTRIN) 800 MG tablet, TAKE ONE TABLET BY MOUTH THREE TIMES DAILY (Patient taking differently: TAKE ONE TABLET BY MOUTH THREE TIMES DAILY prn), Disp: 90 tablet, Rfl: 2 .  levothyroxine (SYNTHROID, LEVOTHROID) 137 MCG tablet, TAKE 1 TABLET BY MOUTH EVERY DAY, Disp: 30 tablet, Rfl: 11 .  losartan (COZAAR) 50 MG tablet, Take 1 tablet (50 mg total) by mouth daily., Disp: 90 tablet, Rfl: 3 .  magnesium 30 MG tablet, Take 30 mg by mouth 2 (two) times daily., Disp: , Rfl:  .  methocarbamol (ROBAXIN) 500 MG tablet, TAKE ONE TABLET BY MOUTH EVERY 6 HOURS AS NEEDED FOR MUSCLE SPASMS, Disp: 120 tablet, Rfl: 2 .  montelukast (SINGULAIR) 10 MG tablet, TAKE 1 TABLET BY MOUTH EVERY EVENING, Disp:  270 tablet, Rfl: 0 .  Multiple Vitamin (MULTIVITAMIN) tablet, Take 1 tablet by mouth daily., Disp: , Rfl:  .  omeprazole (PRILOSEC) 20 MG capsule, TAKE ONE CAPSULE BY MOUTH EVERY DAY, Disp: 30 capsule, Rfl: 2 .  SAVELLA 100 MG TABS tablet, TAKE ONE TABLET BY MOUTH TWICE DAILY, Disp: 60 tablet, Rfl: 2 .  SUPER B COMPLEX/C PO, Take 1 tablet by mouth 2 (two) times daily., Disp: , Rfl:  .  SYMBICORT 80-4.5 MCG/ACT inhaler, INHALE ONE PUFF INTO LUNCGS TWICE DAILY, Disp: 6.9 g, Rfl: 2 .  zinc gluconate 50 MG tablet, Take 50 mg by mouth daily., Disp: , Rfl:  .  amoxicillin-clavulanate (AUGMENTIN) 875-125 MG tablet, Take 1 tablet by mouth 2 (two) times daily. (Patient not taking: Reported on 07/19/2018), Disp: 20 tablet, Rfl: 0 .   chlorpheniramine-HYDROcodone (TUSSIONEX) 10-8 MG/5ML SUER, Take 5 mLs by mouth every 12 (twelve) hours as needed for cough. (Patient not taking: Reported on 07/19/2018), Disp: 120 mL, Rfl: 0 Social History   Socioeconomic History  . Marital status: Single    Spouse name: Not on file  . Number of children: Not on file  . Years of education: Not on file  . Highest education level: Not on file  Occupational History  . Not on file  Social Needs  . Financial resource strain: Not on file  . Food insecurity:    Worry: Not on file    Inability: Not on file  . Transportation needs:    Medical: Not on file    Non-medical: Not on file  Tobacco Use  . Smoking status: Former Smoker    Years: 20.00    Last attempt to quit: 04/30/1999    Years since quitting: 19.2  . Smokeless tobacco: Never Used  Substance and Sexual Activity  . Alcohol use: No    Alcohol/week: 0.0 oz  . Drug use: No  . Sexual activity: Never  Lifestyle  . Physical activity:    Days per week: Not on file    Minutes per session: Not on file  . Stress: Not on file  Relationships  . Social connections:    Talks on phone: Not on file    Gets together: Not on file    Attends religious service: Not on file    Active member of club or organization: Not on file    Attends meetings of clubs or organizations: Not on file    Relationship status: Not on file  . Intimate partner violence:    Fear of current or ex partner: Not on file    Emotionally abused: Not on file    Physically abused: Not on file    Forced sexual activity: Not on file  Other Topics Concern  . Not on file  Social History Narrative  . Not on file   Family History  Problem Relation Age of Onset  . Emphysema Father        died at age 29  . COPD Father   . Hypertension Mother     Objective: Office vital signs reviewed. BP 119/76   Pulse 77   Temp (!) 97.3 F (36.3 C) (Oral)   Ht 5\' 1"  (1.549 m)   Wt 200 lb (90.7 kg)   SpO2 99%   BMI 37.79  kg/m   Physical Examination:  General: Awake, alert, well nourished, nontoxic. No acute distress HEENT: +TTP maxillary sinuses    Neck: No masses palpated. No lymphadenopathy    Ears: Tympanic membranes intact,  normal light reflex on L; opaque TM on right, no erythema, no bulging    Eyes: PERRLA, extraocular membranes intact, sclera white    Nose: nasal turbinates moist, opaque nasal discharge    Throat: moist mucus membranes, no erythema, no tonsillar exudate.  Airway is patent Cardio: regular rate and rhythm, S1S2 heard, no murmurs appreciated Pulm: clear to auscultation bilaterally, no wheezes, rhonchi or rales; normal work of breathing on room air  Assessment/ Plan: 62 y.o. female   1. Subacute maxillary sinusitis Given worsening of symptoms, will treat with Omnicef p.o. twice daily for the next 10 days.  I highly recommended that she avoid use of tap water for sinus rinses and instead use distilled water.  I have also given her Astelin nasal spray.  We discussed using this as a single therapy for the next couple of days.  If no significant improvement may use Flonase and Astelin.  Continue current antihistamines.  Continue probiotic.  Reasons for return evaluation discussed.  If symptoms persist, would consider referral to ear nose and throat.  Follow-up as needed.   Meds ordered this encounter  Medications  . cefdinir (OMNICEF) 300 MG capsule    Sig: Take 1 capsule (300 mg total) by mouth 2 (two) times daily. 1 po BID    Dispense:  20 capsule    Refill:  0  . azelastine (ASTELIN) 0.1 % nasal spray    Sig: Place 2 sprays into both nostrils 2 (two) times daily. Use in each nostril as directed    Dispense:  30 mL    Refill:  12     Chancelor Hardrick Hulen Skains, DO [facility] [phone]

## 2018-07-19 NOTE — Patient Instructions (Signed)
We discussed referral to ear nose and throat today.  I am given you a new nasal spray to use.  You can use this in combination with Flonase but you may see if it will help you without the Flonase first.  I have also prescribed you a new antibiotic called Omnicef to take twice a day for the next 10 days.  If symptoms are not improving, return or give us a call we can place a referral to ear nose and throat.   Sinus Rinse What is a sinus rinse? A sinus rinse is a home treatment. It rinses your sinuses with a mixture of salt and water (saline solution). Sinuses are air-filled spaces in your skull behind the bones of your face and forehead. They open into your nasal cavity. To do a sinus rinse, you will need:  Saline solution.  Neti pot or spray bottle. This releases the saline solution into your nose and through your sinuses. You can buy neti pots and spray bottles at: ? [facility] local pharmacy. ? A health food store. ? Online.  When should I do a sinus rinse? A sinus rinse can help to clear your nasal cavity. It can clear:  Mucus.  Dirt.  Dust.  Pollen.  You may do a sinus rinse when you have:  A cold.  A virus.  Allergies.  A sinus infection.  A stuffy nose.  If you are considering a sinus rinse:  Ask your child's doctor before doing a sinus rinse on your child.  Do not do a sinus rinse if you have had: ? Ear or nasal surgery. ? An ear infection. ? Blocked ears.  How do I do a sinus rinse?  Wash your hands.  Disinfect your device using the directions that came with the device.  Dry your device.  Use the solution that comes with your device or one that is sold separately in stores. Follow the mixing directions on the package.  Fill your device with the amount of saline solution as stated in the device instructions.  Stand over a sink and tilt your head sideways over the sink.  Place the spout of the device in your upper nostril (the one closer to the  ceiling).  Gently pour or squeeze the saline solution into the nasal cavity. The liquid should drain to the lower nostril if you are not too congested.  Gently blow your nose. Blowing too hard may cause ear pain.  Repeat in the other nostril.  Clean and rinse your device with clean water.  Air-dry your device. Are there risks of a sinus rinse? Sinus rinse is normally very safe and helpful. However, there are a few risks, which include:  A burning feeling in the sinuses. This may happen if you do not make the saline solution as instructed. Make sure to follow all directions when making the saline solution.  Infection from unclean water. This is rare, but possible.  Nasal irritation.  This information is not intended to replace advice given to you by your health care provider. Make sure you discuss any questions you have with your health care provider. Document Released: 06/27/2014 Document Revised: 10/27/2016 Document Reviewed: 04/17/2014 Elsevier Interactive Patient Education  2017 [employer]

## 2018-08-01 ENCOUNTER — Other Ambulatory Visit: Payer: Self-pay | Admitting: Family Medicine

## 2018-08-01 ENCOUNTER — Other Ambulatory Visit: Payer: Self-pay | Admitting: Physician Assistant

## 2018-08-01 DIAGNOSIS — J4521 Mild intermittent asthma with (acute) exacerbation: Secondary | ICD-10-CM

## 2018-08-08 DIAGNOSIS — Z0289 Encounter for other administrative examinations: Secondary | ICD-10-CM

## 2018-08-09 ENCOUNTER — Other Ambulatory Visit: Payer: Self-pay | Admitting: Physician Assistant

## 2018-08-09 DIAGNOSIS — M797 Fibromyalgia: Secondary | ICD-10-CM

## 2018-08-09 NOTE — Telephone Encounter (Signed)
Last seen 07/19/18 

## 2018-08-19 ENCOUNTER — Ambulatory Visit (INDEPENDENT_AMBULATORY_CARE_PROVIDER_SITE_OTHER): Payer: PPO | Admitting: Physician Assistant

## 2018-08-19 ENCOUNTER — Encounter: Payer: Self-pay | Admitting: Physician Assistant

## 2018-08-19 VITALS — BP 138/84 | HR 75 | Temp 97.6°F | Ht 61.0 in | Wt 198.4 lb

## 2018-08-19 DIAGNOSIS — J4521 Mild intermittent asthma with (acute) exacerbation: Secondary | ICD-10-CM

## 2018-08-19 DIAGNOSIS — J4540 Moderate persistent asthma, uncomplicated: Secondary | ICD-10-CM | POA: Diagnosis not present

## 2018-08-19 MED ORDER — ALBUTEROL SULFATE (2.5 MG/3ML) 0.083% IN NEBU: INHALATION_SOLUTION | RESPIRATORY_TRACT | 5 refills | Status: DC

## 2018-08-19 MED ORDER — PREDNISONE 10 MG (21) PO TBPK: ORAL_TABLET | ORAL | 0 refills | Status: DC

## 2018-08-19 MED ORDER — FLUTICASONE FUROATE-VILANTEROL 100-25 MCG/INH IN AEPB: 1.0000 | INHALATION_SPRAY | Freq: Every day | RESPIRATORY_TRACT | 1 refills | Status: DC

## 2018-08-19 NOTE — Progress Notes (Signed)
BP 138/84   Pulse 75   Temp 97.6 F (36.4 C) (Oral)   Ht 5\' 1"  (1.549 m)   Wt 198 lb 6.4 oz (90 kg)   SpO2 97%   BMI 37.49 kg/m    Subjective:    Patient ID: Alicia Russo, female    DOB: 07-09-1956, 62 y.o.   MRN: [id number]  HPI: Alicia Russo is a 62 y.o. female presenting on 08/19/2018 for Asthma  This patient comes in with few days of cough and wheezing.  She has not had an asthma attack all summer.  She has been trying not to move.  In the past couple weeks she has had an increase amount of cough and wheezing.  She is not productive.  She denies any fever or chills.  She has been using her albuterol on a regular basis.  She has used Symbicort in the past.  She does note that it makes her feel nervous.  Past Medical History:  Diagnosis Date  . Anxiety   . Asthma   . AVM (arteriovenous malformation)    Intracranial with seizures  . Closed fracture of left distal radius   . Fibromyalgia   . GERD (gastroesophageal reflux disease)   . Hyperlipidemia   . Hypothyroidism   . Seizures (HCC)    AVM repair   Relevant past medical, surgical, family and social history reviewed and updated as indicated. Interim medical history since our last visit reviewed. Allergies and medications reviewed and updated. DATA REVIEWED: CHART IN EPIC  Family History reviewed for pertinent findings.  Review of Systems  Constitutional: Negative.  Negative for activity change, fatigue and fever.  HENT: Positive for congestion.   Eyes: Negative.   Respiratory: Positive for cough, shortness of breath and wheezing.   Cardiovascular: Negative.  Negative for chest pain.  Gastrointestinal: Negative.  Negative for abdominal pain.  Endocrine: Negative.   Genitourinary: Negative.  Negative for dysuria.  Musculoskeletal: Negative.   Skin: Negative.   Neurological: Negative.     Allergies as of 08/19/2018      Reactions   Bee Venom    Cant breath, swell up   Cherry Flavor Hives     dizziness   Citalopram Hydrobromide    Mood swings, gets "really really mean"   Tramadol    unknown      Medication List        Accurate as of 08/19/18  8:47 AM. Always use your most recent med list.          ALPRAZolam 0.5 MG tablet Commonly known as:  XANAX Take 1 tablet (0.5 mg total) by mouth 2 (two) times daily as needed.   aspirin 81 MG EC tablet Take 81 mg by mouth 2 (two) times daily.   azelastine 0.1 % nasal spray Commonly known as:  ASTELIN Place 2 sprays into both nostrils 2 (two) times daily. Use in each nostril as directed   Biotin 10 MG Tabs Take 1 tablet by mouth 2 (two) times daily.   cetirizine 10 MG tablet Commonly known as:  ZYRTEC Take 10 mg by mouth daily.   chlorpheniramine-HYDROcodone 10-8 MG/5ML Suer Commonly known as:  TUSSIONEX Take 5 mLs by mouth every 12 (twelve) hours as needed for cough.   fluticasone furoate-vilanterol 100-25 MCG/INH Aepb Commonly known as:  BREO ELLIPTA Inhale 1 puff into the lungs daily.   ibuprofen 800 MG tablet Commonly known as:  ADVIL,MOTRIN TAKE ONE TABLET BY MOUTH THREE TIMES DAILY  levothyroxine 137 MCG tablet Commonly known as:  SYNTHROID, LEVOTHROID TAKE 1 TABLET BY MOUTH EVERY DAY   losartan 50 MG tablet Commonly known as:  COZAAR Take 1 tablet (50 mg total) by mouth daily.   magnesium 30 MG tablet Take 30 mg by mouth 2 (two) times daily.   methocarbamol 500 MG tablet Commonly known as:  ROBAXIN TAKE 1 TABLET BY MOUTH EVERY 6 HOURS AS NEEDED FOR MUSCLE SPASMS   montelukast 10 MG tablet Commonly known as:  SINGULAIR TAKE 1 TABLET BY MOUTH EVERY EVENING   multivitamin tablet Take 1 tablet by mouth daily.   omeprazole 20 MG capsule Commonly known as:  PRILOSEC TAKE ONE CAPSULE BY MOUTH EVERY DAY   predniSONE 10 MG (21) Tbpk tablet Commonly known as:  STERAPRED UNI-PAK 21 TAB As directed x 6 days   SAVELLA 100 MG Tabs tablet Generic drug:  Milnacipran HCl TAKE ONE TABLET BY MOUTH TWICE DAILY     SUPER B COMPLEX/C PO Take 1 tablet by mouth 2 (two) times daily.   VENTOLIN HFA 108 (90 Base) MCG/ACT inhaler Generic drug:  albuterol INHALE 2 PUFFS INTO THE LUNGS EVERY 6 HOURS AS NEEDED FOR WHEEZING OR SHORTNESS OF BREATH.   albuterol (2.5 MG/3ML) 0.083% nebulizer solution Commonly known as:  PROVENTIL USE ONE vial in nebulizer EVERY 6 HOURS AS NEEDED wheezing SHORTNESS OF BREATH   VITAMIN B-12 PO Take 1 tablet by mouth 2 (two) times daily.   Vitamin D3 2000 units Tabs Take 1 tablet by mouth 2 (two) times daily.   zinc gluconate 50 MG tablet Take 50 mg by mouth daily.          Objective:    BP 138/84   Pulse 75   Temp 97.6 F (36.4 C) (Oral)   Ht 5\' 1"  (1.549 m)   Wt 198 lb 6.4 oz (90 kg)   SpO2 97%   BMI 37.49 kg/m   Allergies  Allergen Reactions  . Bee Venom     Cant breath, swell up  . Cherry Flavor Hives      dizziness  . Citalopram Hydrobromide     Mood swings, gets "really really mean"  . Tramadol     unknown    Wt Readings from Last 3 Encounters:  08/19/18 198 lb 6.4 oz (90 kg)  07/19/18 200 lb (90.7 kg)  07/04/18 202 lb 6.4 oz (91.8 kg)    Physical Exam  Constitutional: She is oriented to person, place, and time. She appears well-developed and well-nourished.  HENT:  Head: Normocephalic and atraumatic.  Right Ear: Tympanic membrane, external ear and ear canal normal.  Left Ear: Tympanic membrane, external ear and ear canal normal.  Nose: Nose normal. No rhinorrhea.  Mouth/Throat: Oropharynx is clear and moist and mucous membranes are normal. No oropharyngeal exudate or posterior oropharyngeal erythema.  Eyes: Pupils are equal, round, and reactive to light. Conjunctivae and EOM are normal.  Neck: Normal range of motion. Neck supple.  Cardiovascular: Normal rate, regular rhythm, normal heart sounds and intact distal pulses.  Pulmonary/Chest: Effort normal and breath sounds normal.  Abdominal: Soft. Bowel sounds are normal.  Neurological: She  is alert and oriented to person, place, and time. She has normal reflexes.  Skin: Skin is warm and dry. No rash noted.  Psychiatric: She has a normal mood and affect. Her behavior is normal. Judgment and thought content normal.        Assessment & Plan:   1. Moderate persistent asthma, unspecified  whether complicated - fluticasone furoate-vilanterol (BREO ELLIPTA) 100-25 MCG/INH AEPB; Inhale 1 puff into the lungs daily.  Dispense: 60 each; Refill: 1 - predniSONE (STERAPRED UNI-PAK 21 TAB) 10 MG (21) TBPK tablet; As directed x 6 days  Dispense: 21 tablet; Refill: 0  2. Mild intermittent asthma with acute exacerbation - albuterol (PROVENTIL) (2.5 MG/3ML) 0.083% nebulizer solution; USE ONE vial in nebulizer EVERY 6 HOURS AS NEEDED wheezing SHORTNESS OF BREATH  Dispense: 240 mL; Refill: 5   Continue all other maintenance medications as listed above.  Follow up plan: No follow-ups on file.  Educational handout given for survey  Remus Loffler PA-C [facility] [address]  [place], [place] [address] [phone]   08/19/2018, 8:47 AM

## 2018-09-01 ENCOUNTER — Telehealth: Payer: Self-pay | Admitting: Physician Assistant

## 2018-10-10 ENCOUNTER — Other Ambulatory Visit: Payer: Self-pay | Admitting: Physician Assistant

## 2018-10-12 ENCOUNTER — Ambulatory Visit: Payer: PPO | Admitting: Physician Assistant

## 2018-11-01 ENCOUNTER — Encounter: Payer: Self-pay | Admitting: Physician Assistant

## 2018-11-01 ENCOUNTER — Ambulatory Visit (INDEPENDENT_AMBULATORY_CARE_PROVIDER_SITE_OTHER): Payer: PPO | Admitting: Physician Assistant

## 2018-11-01 VITALS — BP 136/76 | HR 77 | Temp 97.0°F | Ht 61.0 in | Wt 198.0 lb

## 2018-11-01 DIAGNOSIS — Z0000 Encounter for general adult medical examination without abnormal findings: Secondary | ICD-10-CM | POA: Diagnosis not present

## 2018-11-01 DIAGNOSIS — I10 Essential (primary) hypertension: Secondary | ICD-10-CM

## 2018-11-01 DIAGNOSIS — E039 Hypothyroidism, unspecified: Secondary | ICD-10-CM

## 2018-11-01 DIAGNOSIS — J4521 Mild intermittent asthma with (acute) exacerbation: Secondary | ICD-10-CM

## 2018-11-01 MED ORDER — MILNACIPRAN HCL 100 MG PO TABS: 100.0000 mg | ORAL_TABLET | Freq: Two times a day (BID) | ORAL | 11 refills | Status: DC

## 2018-11-01 MED ORDER — ALBUTEROL SULFATE HFA 108 (90 BASE) MCG/ACT IN AERS: INHALATION_SPRAY | RESPIRATORY_TRACT | 5 refills | Status: DC

## 2018-11-01 MED ORDER — OMEPRAZOLE 20 MG PO CPDR: 20.0000 mg | DELAYED_RELEASE_CAPSULE | Freq: Every day | ORAL | 11 refills | Status: DC

## 2018-11-01 MED ORDER — LEVOTHYROXINE SODIUM 137 MCG PO TABS: 137.0000 ug | ORAL_TABLET | Freq: Every day | ORAL | 11 refills | Status: DC

## 2018-11-01 MED ORDER — LOSARTAN POTASSIUM 50 MG PO TABS: 50.0000 mg | ORAL_TABLET | Freq: Every day | ORAL | 3 refills | Status: DC

## 2018-11-01 MED ORDER — MONTELUKAST SODIUM 10 MG PO TABS: 10.0000 mg | ORAL_TABLET | Freq: Every evening | ORAL | 3 refills | Status: DC

## 2018-11-01 MED ORDER — CETIRIZINE HCL 10 MG PO TABS: 10.0000 mg | ORAL_TABLET | Freq: Every day | ORAL | 3 refills | Status: DC

## 2018-11-01 NOTE — Progress Notes (Signed)
Established Patient Office Visit  Subjective:  Patient ID: Alicia Russo, female    DOB: 05/01/1956  Age: 62 y.o. MRN: [id number]  CC:  Chief Complaint  Patient presents with  . Annual Exam    HPI Alicia Russo presents for well female exam  This patient comes in for annual well physical examination. All medications are reviewed today. There are no reports of any problems with the medications. All of the medical conditions are reviewed and updated.  Lab work is reviewed and will be ordered as medically necessary. There are no new problems reported with today's visit.  Patient reports doing well overall.   Past Medical History:  Diagnosis Date  . Anxiety   . Asthma   . AVM (arteriovenous malformation)    Intracranial with seizures  . Closed fracture of left distal radius   . Fibromyalgia   . GERD (gastroesophageal reflux disease)   . Hyperlipidemia   . Hypothyroidism   . Seizures ([place])    AVM repair    Past Surgical History:  Procedure Laterality Date  . CHOLECYSTECTOMY    . CRANIOTOMY     for AVM  . ORIF WRIST FRACTURE Left 02/16/2017   Procedure: OPEN REDUCTION INTERNAL FIXATION (ORIF) LEFT WRIST FRACTURE;  Surgeon: Renette Butters, MD;  Location: [place];  Service: Orthopedics;  Laterality: Left;    Family History  Problem Relation Age of Onset  . Emphysema Father        died at age 23  . COPD Father   . Hypertension Mother     Social History   Socioeconomic History  . Marital status: Single    Spouse name: Not on file  . Number of children: Not on file  . Years of education: Not on file  . Highest education level: Not on file  Occupational History  . Not on file  Social Needs  . Financial resource strain: Not on file  . Food insecurity:    Worry: Not on file    Inability: Not on file  . Transportation needs:    Medical: Not on file    Non-medical: Not on file  Tobacco Use  . Smoking status: Former Smoker    Years: 20.00   Last attempt to quit: 04/30/1999    Years since quitting: 19.5  . Smokeless tobacco: Never Used  Substance and Sexual Activity  . Alcohol use: No    Alcohol/week: 0.0 standard drinks  . Drug use: No  . Sexual activity: Never  Lifestyle  . Physical activity:    Days per week: Not on file    Minutes per session: Not on file  . Stress: Not on file  Relationships  . Social connections:    Talks on phone: Not on file    Gets together: Not on file    Attends religious service: Not on file    Active member of club or organization: Not on file    Attends meetings of clubs or organizations: Not on file    Relationship status: Not on file  . Intimate partner violence:    Fear of current or ex partner: Not on file    Emotionally abused: Not on file    Physically abused: Not on file    Forced sexual activity: Not on file  Other Topics Concern  . Not on file  Social History Narrative  . Not on file    Outpatient Medications Prior to Visit  Medication Sig Dispense Refill  .  albuterol (PROVENTIL) (2.5 MG/3ML) 0.083% nebulizer solution USE ONE vial in nebulizer EVERY 6 HOURS AS NEEDED wheezing SHORTNESS OF BREATH 240 mL 5  . aspirin 81 MG EC tablet Take 81 mg by mouth 2 (two) times daily.     Marland Kitchen azelastine (ASTELIN) 0.1 % nasal spray Place 2 sprays into both nostrils 2 (two) times daily. Use in each nostril as directed 30 mL 12  . Biotin 10 MG TABS Take 1 tablet by mouth 2 (two) times daily.     . chlorpheniramine-HYDROcodone (TUSSIONEX) 10-8 MG/5ML SUER Take 5 mLs by mouth every 12 (twelve) hours as needed for cough. 120 mL 0  . Cholecalciferol (VITAMIN D3) 2000 UNITS TABS Take 1 tablet by mouth 2 (two) times daily.    . Cyanocobalamin (VITAMIN B-12 PO) Take 1 tablet by mouth 2 (two) times daily.    . fluticasone furoate-vilanterol (BREO ELLIPTA) 100-25 MCG/INH AEPB Inhale 1 puff into the lungs daily. 60 each 1  . ibuprofen (ADVIL,MOTRIN) 800 MG tablet TAKE ONE TABLET BY MOUTH THREE TIMES  DAILY (Patient taking differently: TAKE ONE TABLET BY MOUTH THREE TIMES DAILY prn) 90 tablet 2  . magnesium 30 MG tablet Take 30 mg by mouth 2 (two) times daily.    . methocarbamol (ROBAXIN) 500 MG tablet TAKE 1 TABLET BY MOUTH EVERY 6 HOURS AS NEEDED FOR MUSCLE SPASMS 120 tablet 2  . Multiple Vitamin (MULTIVITAMIN) tablet Take 1 tablet by mouth daily.    . SUPER B COMPLEX/C PO Take 1 tablet by mouth 2 (two) times daily.    Marland Kitchen zinc gluconate 50 MG tablet Take 50 mg by mouth daily.    Marland Kitchen ALPRAZolam (XANAX) 0.5 MG tablet Take 1 tablet (0.5 mg total) by mouth 2 (two) times daily as needed. 60 tablet 5  . cetirizine (ZYRTEC) 10 MG tablet Take 10 mg by mouth daily.    Marland Kitchen levothyroxine (SYNTHROID, LEVOTHROID) 137 MCG tablet TAKE 1 TABLET BY MOUTH EVERY DAY 30 tablet 11  . losartan (COZAAR) 50 MG tablet Take 1 tablet (50 mg total) by mouth daily. 90 tablet 3  . montelukast (SINGULAIR) 10 MG tablet TAKE 1 TABLET BY MOUTH EVERY EVENING 270 tablet 0  . omeprazole (PRILOSEC) 20 MG capsule TAKE ONE CAPSULE BY MOUTH EVERY DAY 30 capsule 4  . predniSONE (STERAPRED UNI-PAK 21 TAB) 10 MG (21) TBPK tablet As directed x 6 days 21 tablet 0  . SAVELLA 100 MG TABS tablet TAKE ONE TABLET BY MOUTH TWICE DAILY 60 tablet 2  . VENTOLIN HFA 108 (90 Base) MCG/ACT inhaler INHALE 2 PUFFS INTO THE LUNGS EVERY 6 HOURS AS NEEDED FOR WHEEZING OR SHORTNESS OF BREATH. 18 g 0   No facility-administered medications prior to visit.     Allergies  Allergen Reactions  . Bee Venom     Cant breath, swell up  . Cherry Flavor Hives      dizziness  . Citalopram Hydrobromide     Mood swings, gets "really really mean"  . Tramadol     unknown    ROS Review of Systems  Constitutional: Negative.  Negative for activity change, fatigue and fever.  HENT: Negative.   Eyes: Negative.   Respiratory: Negative.  Negative for cough.   Cardiovascular: Negative.  Negative for chest pain.  Gastrointestinal: Negative.  Negative for abdominal  pain.  Endocrine: Negative.   Genitourinary: Negative.  Negative for dysuria.  Musculoskeletal: Negative.   Skin: Negative.   Neurological: Negative.       Objective:  Physical Exam  Constitutional: She is oriented to person, place, and time. She appears well-developed and well-nourished.  HENT:  Head: Normocephalic and atraumatic.  Eyes: Pupils are equal, round, and reactive to light. Conjunctivae and EOM are normal.  Neck: Normal range of motion. Neck supple.  Cardiovascular: Normal rate, regular rhythm, normal heart sounds and intact distal pulses.  Pulmonary/Chest: Effort normal and breath sounds normal. Right breast exhibits no mass, no skin change and no tenderness. Left breast exhibits no mass, no skin change and no tenderness. No breast swelling, tenderness, discharge or bleeding. Breasts are symmetrical.  Abdominal: Soft. Bowel sounds are normal.  Genitourinary: Vagina normal and uterus normal. Rectal exam shows no fissure. No breast swelling, tenderness, discharge or bleeding. There is no tenderness or lesion on the right labia. There is no tenderness or lesion on the left labia. Uterus is not deviated, not enlarged and not tender. Cervix exhibits no motion tenderness, no discharge and no friability. Right adnexum displays no mass, no tenderness and no fullness. Left adnexum displays no mass, no tenderness and no fullness. No tenderness or bleeding in the vagina. No vaginal discharge found.  Neurological: She is alert and oriented to person, place, and time. She has normal reflexes.  Skin: Skin is warm and dry. No rash noted.  Psychiatric: She has a normal mood and affect. Her behavior is normal. Judgment and thought content normal.  Nursing note and vitals reviewed.   BP 136/76   Pulse 77   Temp (!) 97 F (36.1 C) (Oral)   Ht _0  (1.549 m)   Wt 198 lb (89.8 kg)   BMI 37.41 kg/m  Wt Readings from Last 3 Encounters:  11/01/18 198 lb (89.8 kg)  08/19/18 198 lb 6.4 oz  (90 kg)  07/19/18 200 lb (90.7 kg)     Health Maintenance Due  Topic Date Due  . HIV Screening  09/14/1971  . TETANUS/TDAP  09/14/1975  . COLONOSCOPY  09/13/2006    There are no preventive care reminders to display for this patient.  Lab Results  Component Value Date   TSH 1.970 10/13/2017   Lab Results  Component Value Date   WBC 9.4 10/13/2017   HGB 14.6 10/13/2017   HCT 45.6 10/13/2017   MCV 92 10/13/2017   PLT 346 10/13/2017   Lab Results  Component Value Date   NA 145 (H) 10/13/2017   K 4.6 10/13/2017   CO2 24 10/13/2017   GLUCOSE 98 10/13/2017   BUN 8 10/13/2017   CREATININE 0.87 10/13/2017   BILITOT <0.2 10/13/2017   ALKPHOS 109 10/13/2017   AST 22 10/13/2017   ALT 30 10/13/2017   PROT 6.1 10/13/2017   ALBUMIN 3.9 10/13/2017   CALCIUM 9.0 10/13/2017   ANIONGAP 11 01/06/2015   Lab Results  Component Value Date   CHOL 183 10/13/2017   Lab Results  Component Value Date   HDL 52 10/13/2017   Lab Results  Component Value Date   LDLCALC 103 (H) 10/13/2017   Lab Results  Component Value Date   TRIG 140 10/13/2017   Lab Results  Component Value Date   CHOLHDL 3.5 10/13/2017   No results found for: HGBA1C    Assessment & Plan:   Problem List Items Addressed This Visit      Cardiovascular and Mediastinum   Essential hypertension   Relevant Medications   losartan (COZAAR) 50 MG tablet     Respiratory   Mild intermittent asthma   Relevant Medications  albuterol (VENTOLIN HFA) 108 (90 Base) MCG/ACT inhaler   montelukast (SINGULAIR) 10 MG tablet     Endocrine   Hypothyroidism   Relevant Medications   levothyroxine (SYNTHROID, LEVOTHROID) 137 MCG tablet    Other Visit Diagnoses    Well adult exam    -  Primary   Relevant Orders   CBC with Differential/Platelet   CMP14+EGFR   Lipid panel   TSH      Meds ordered this encounter  Medications  . Milnacipran HCl (SAVELLA) 100 MG TABS tablet    Sig: Take 1 tablet (100 mg total) by  mouth 2 (two) times daily.    Dispense:  60 tablet    Refill:  11    This prescription was filled on 05/17/2018. Any refills authorized will be placed on file.    Order Specific Question:   Supervising Provider    Answer:   Janora Norlander [[id number]]  . omeprazole (PRILOSEC) 20 MG capsule    Sig: Take 1 capsule (20 mg total) by mouth daily.    Dispense:  30 capsule    Refill:  11    Order Specific Question:   Supervising Provider    Answer:   Janora Norlander [[id number]]  . losartan (COZAAR) 50 MG tablet    Sig: Take 1 tablet (50 mg total) by mouth daily.    Dispense:  90 tablet    Refill:  3    regarding losartan; patient states she only takes 1/2 a tablet now and would like a new rx, thanks    Order Specific Question:   Supervising Provider    Answer:   Janora Norlander [[id number]]  . albuterol (VENTOLIN HFA) 108 (90 Base) MCG/ACT inhaler    Sig: INHALE 2 PUFFS INTO THE LUNGS EVERY 6 HOURS AS NEEDED FOR WHEEZING OR SHORTNESS OF BREATH.    Dispense:  18 g    Refill:  5    Order Specific Question:   Supervising Provider    Answer:   Janora Norlander [[id number]]  . montelukast (SINGULAIR) 10 MG tablet    Sig: Take 1 tablet (10 mg total) by mouth every evening.    Dispense:  90 tablet    Refill:  3    Order Specific Question:   Supervising Provider    Answer:   Janora Norlander [[id number]]  . levothyroxine (SYNTHROID, LEVOTHROID) 137 MCG tablet    Sig: Take 1 tablet (137 mcg total) by mouth daily.    Dispense:  30 tablet    Refill:  11    Order Specific Question:   Supervising Provider    Answer:   Janora Norlander [[id number]]  . cetirizine (ZYRTEC) 10 MG tablet    Sig: Take 1 tablet (10 mg total) by mouth daily.    Dispense:  90 tablet    Refill:  3    Order Specific Question:   Supervising Provider    Answer:   Janora Norlander [[id number]]    Follow-up: No follow-ups on file.    Terald Sleeper, PA-C

## 2018-11-02 LAB — CMP14+EGFR
A/G RATIO: 2.1 (ref 1.2–2.2)
ALT: 28 IU/L (ref 0–32)
AST: 25 IU/L (ref 0–40)
Albumin: 3.9 g/dL (ref 3.6–4.8)
Alkaline Phosphatase: 82 IU/L (ref 39–117)
BUN/Creatinine Ratio: 13 (ref 12–28)
BUN: 9 mg/dL (ref 8–27)
Bilirubin Total: 0.3 mg/dL (ref 0.0–1.2)
CALCIUM: 8.9 mg/dL (ref 8.7–10.3)
CO2: 24 mmol/L (ref 20–29)
CREATININE: 0.67 mg/dL (ref 0.57–1.00)
Chloride: 103 mmol/L (ref 96–106)
GFR, EST AFRICAN AMERICAN: 109 mL/min/{1.73_m2} (ref >59)
GFR, EST NON AFRICAN AMERICAN: 95 mL/min/{1.73_m2} (ref >59)
Globulin, Total: 1.9 g/dL (ref 1.5–4.5)
Glucose: 86 mg/dL (ref 65–99)
Potassium: 4.1 mmol/L (ref 3.5–5.2)
Sodium: 143 mmol/L (ref 134–144)
TOTAL PROTEIN: 5.8 g/dL — AB (ref 6.0–8.5)

## 2018-11-02 LAB — CBC WITH DIFFERENTIAL/PLATELET
BASOS: 1 %
Basophils Absolute: 0.1 10*3/uL (ref 0.0–0.2)
EOS (ABSOLUTE): 0.3 10*3/uL (ref 0.0–0.4)
EOS: 4 %
HEMATOCRIT: 43.7 % (ref 34.0–46.6)
HEMOGLOBIN: 14.6 g/dL (ref 11.1–15.9)
IMMATURE GRANS (ABS): 0 10*3/uL (ref 0.0–0.1)
IMMATURE GRANULOCYTES: 0 %
LYMPHS: 28 %
Lymphocytes Absolute: 2.2 10*3/uL (ref 0.7–3.1)
MCH: 28.9 pg (ref 26.6–33.0)
MCHC: 33.4 g/dL (ref 31.5–35.7)
MCV: 86 fL (ref 79–97)
MONOCYTES: 8 %
Monocytes Absolute: 0.6 10*3/uL (ref 0.1–0.9)
NEUTROS PCT: 59 %
Neutrophils Absolute: 4.4 10*3/uL (ref 1.4–7.0)
Platelets: 312 10*3/uL (ref 150–450)
RBC: 5.06 x10E6/uL (ref 3.77–5.28)
RDW: 13.7 % (ref 12.3–15.4)
WBC: 7.6 10*3/uL (ref 3.4–10.8)

## 2018-11-02 LAB — TSH: TSH: 1.64 u[IU]/mL (ref 0.450–4.500)

## 2018-11-02 LAB — LIPID PANEL
CHOL/HDL RATIO: 3.3 ratio (ref 0.0–4.4)
Cholesterol, Total: 168 mg/dL (ref 100–199)
HDL: 51 mg/dL (ref >39)
LDL CALC: 91 mg/dL (ref 0–99)
TRIGLYCERIDES: 129 mg/dL (ref 0–149)
VLDL Cholesterol Cal: 26 mg/dL (ref 5–40)

## 2018-11-21 ENCOUNTER — Encounter: Payer: Self-pay | Admitting: Family Medicine

## 2018-11-21 ENCOUNTER — Ambulatory Visit (INDEPENDENT_AMBULATORY_CARE_PROVIDER_SITE_OTHER): Payer: PPO | Admitting: Family Medicine

## 2018-11-21 VITALS — BP 132/74 | HR 80 | Temp 98.4°F | Ht 61.0 in | Wt 197.0 lb

## 2018-11-21 DIAGNOSIS — J4520 Mild intermittent asthma, uncomplicated: Secondary | ICD-10-CM

## 2018-11-21 DIAGNOSIS — R062 Wheezing: Secondary | ICD-10-CM | POA: Diagnosis not present

## 2018-11-21 DIAGNOSIS — J0101 Acute recurrent maxillary sinusitis: Secondary | ICD-10-CM

## 2018-11-21 MED ORDER — AMOXICILLIN-POT CLAVULANATE 875-125 MG PO TABS: 1.0000 | ORAL_TABLET | Freq: Two times a day (BID) | ORAL | 0 refills | Status: AC

## 2018-11-21 MED ORDER — METHYLPREDNISOLONE ACETATE 80 MG/ML IJ SUSP
80.0000 mg | Freq: Once | INTRAMUSCULAR | Status: AC
  Administered 2018-11-21: 80 mg via INTRAMUSCULAR

## 2018-11-21 NOTE — Progress Notes (Signed)
Subjective:    Patient ID: Alicia Russo, female    DOB: 1956-12-03, 62 y.o.   MRN: [id number]  Chief Complaint:  Sinusitis (sinus congestion, runny nose) and Asthma (cough, shortness of breath)   HPI: Alicia Russo is a 62 y.o. female presenting on 11/21/2018 for Sinusitis (sinus congestion, runny nose) and Asthma (cough, shortness of breath)  Pt presents today with sinus pressure and congestion, rhinorrhea, cough, wheezing, sore throat, and exertional shortness of breath. Pt states this started last Sunday and has been gradually getting worse. States she has had to use her albuterol nebs 3 times over the last week. States she last used it yesterday. She states she has had chills and headaches with the symptoms. Denies resting shortness of breath, chest pain, fatigue, or weakness. Pt states she has uses sinus rinses and her nasal spray with minimal relief of symptoms. States she was in the care with her Doristine Bosworth last Sunday and he was smoking while she was in the care with him. Symptoms worsened after exposure to smoke.   Relevant past medical, surgical, family, and social history reviewed and updated as indicated.  Allergies and medications reviewed and updated.   Past Medical History:  Diagnosis Date  . Anxiety   . Asthma   . AVM (arteriovenous malformation)    Intracranial with seizures  . Closed fracture of left distal radius   . Fibromyalgia   . GERD (gastroesophageal reflux disease)   . Hyperlipidemia   . Hypothyroidism   . Seizures ([place])    AVM repair    Past Surgical History:  Procedure Laterality Date  . CHOLECYSTECTOMY    . CRANIOTOMY     for AVM  . ORIF WRIST FRACTURE Left 02/16/2017   Procedure: OPEN REDUCTION INTERNAL FIXATION (ORIF) LEFT WRIST FRACTURE;  Surgeon: Renette Butters, MD;  Location: [facility] [place];  Service: Orthopedics;  Laterality: Left;    Social History   Socioeconomic History  . Marital status: Single    Spouse name: Not on  file  . Number of children: Not on file  . Years of education: Not on file  . Highest education level: Not on file  Occupational History  . Not on file  Social Needs  . Financial resource strain: Not on file  . Food insecurity:    Worry: Not on file    Inability: Not on file  . Transportation needs:    Medical: Not on file    Non-medical: Not on file  Tobacco Use  . Smoking status: Former Smoker    Years: 20.00    Last attempt to quit: 04/30/1999    Years since quitting: 19.5  . Smokeless tobacco: Never Used  Substance and Sexual Activity  . Alcohol use: No    Alcohol/week: 0.0 standard drinks  . Drug use: No  . Sexual activity: Never  Lifestyle  . Physical activity:    Days per week: Not on file    Minutes per session: Not on file  . Stress: Not on file  Relationships  . Social connections:    Talks on phone: Not on file    Gets together: Not on file    Attends religious service: Not on file    Active member of club or organization: Not on file    Attends meetings of clubs or organizations: Not on file    Relationship status: Not on file  . Intimate partner violence:    Fear of current or ex  partner: Not on file    Emotionally abused: Not on file    Physically abused: Not on file    Forced sexual activity: Not on file  Other Topics Concern  . Not on file  Social History Narrative  . Not on file    Outpatient Encounter Medications as of 11/21/2018  Medication Sig  . albuterol (PROVENTIL) (2.5 MG/3ML) 0.083% nebulizer solution USE ONE vial in nebulizer EVERY 6 HOURS AS NEEDED wheezing SHORTNESS OF BREATH  . albuterol (VENTOLIN HFA) 108 (90 Base) MCG/ACT inhaler INHALE 2 PUFFS INTO THE LUNGS EVERY 6 HOURS AS NEEDED FOR WHEEZING OR SHORTNESS OF BREATH.  Marland Kitchen aspirin 81 MG EC tablet Take 81 mg by mouth 2 (two) times daily.   Marland Kitchen azelastine (ASTELIN) 0.1 % nasal spray Place 2 sprays into both nostrils 2 (two) times daily. Use in each nostril as directed  . benzonatate  (TESSALON) 100 MG capsule Take 100 mg by mouth 2 (two) times daily as needed for cough.  . Biotin 10 MG TABS Take 1 tablet by mouth 2 (two) times daily.   . cetirizine (ZYRTEC) 10 MG tablet Take 1 tablet (10 mg total) by mouth daily.  . Cholecalciferol (VITAMIN D3) 2000 UNITS TABS Take 1 tablet by mouth 2 (two) times daily.  . Cyanocobalamin (VITAMIN B-12 PO) Take 1 tablet by mouth 2 (two) times daily.  . fluticasone furoate-vilanterol (BREO ELLIPTA) 100-25 MCG/INH AEPB Inhale 1 puff into the lungs daily.  Marland Kitchen ibuprofen (ADVIL,MOTRIN) 800 MG tablet TAKE ONE TABLET BY MOUTH THREE TIMES DAILY (Patient taking differently: TAKE ONE TABLET BY MOUTH THREE TIMES DAILY prn)  . levothyroxine (SYNTHROID, LEVOTHROID) 137 MCG tablet Take 1 tablet (137 mcg total) by mouth daily.  Marland Kitchen losartan (COZAAR) 50 MG tablet Take 1 tablet (50 mg total) by mouth daily.  . magnesium 30 MG tablet Take 30 mg by mouth 2 (two) times daily.  . methocarbamol (ROBAXIN) 500 MG tablet TAKE 1 TABLET BY MOUTH EVERY 6 HOURS AS NEEDED FOR MUSCLE SPASMS  . Milnacipran HCl (SAVELLA) 100 MG TABS tablet Take 1 tablet (100 mg total) by mouth 2 (two) times daily.  . montelukast (SINGULAIR) 10 MG tablet Take 1 tablet (10 mg total) by mouth every evening.  . Multiple Vitamin (MULTIVITAMIN) tablet Take 1 tablet by mouth daily.  Marland Kitchen omeprazole (PRILOSEC) 20 MG capsule Take 1 capsule (20 mg total) by mouth daily.  . SUPER B COMPLEX/C PO Take 1 tablet by mouth 2 (two) times daily.  Marland Kitchen zinc gluconate 50 MG tablet Take 50 mg by mouth daily.  Marland Kitchen amoxicillin-clavulanate (AUGMENTIN) 875-125 MG tablet Take 1 tablet by mouth 2 (two) times daily for 7 days.  . chlorpheniramine-HYDROcodone (TUSSIONEX) 10-8 MG/5ML SUER Take 5 mLs by mouth every 12 (twelve) hours as needed for cough. (Patient not taking: Reported on 11/21/2018)  . [EXPIRED] methylPREDNISolone acetate (DEPO-MEDROL) injection 80 mg    No facility-administered encounter medications on file as of  11/21/2018.     Allergies  Allergen Reactions  . Bee Venom     Cant breath, swell up  . Cherry Flavor Hives      dizziness  . Citalopram Hydrobromide     Mood swings, gets "really really mean"  . Tramadol     unknown    Review of Systems  Constitutional: Positive for chills. Negative for activity change, fatigue and fever.  HENT: Positive for congestion, postnasal drip, rhinorrhea, sinus pressure, sinus pain and sore throat.   Respiratory: Positive for cough and  shortness of breath (exertional). Negative for chest tightness.   Cardiovascular: Negative for chest pain and palpitations.  Gastrointestinal: Negative for abdominal pain, nausea and vomiting.  Musculoskeletal: Negative for myalgias.  Neurological: Positive for headaches. Negative for dizziness, weakness and light-headedness.  Psychiatric/Behavioral: Negative for confusion.  All other systems reviewed and are negative.       Objective:    BP 132/74   Pulse 80   Temp 98.4 F (36.9 C) (Oral)   Ht '5\' 1"'  (1.549 m)   Wt 197 lb (89.4 kg)   SpO2 100%   BMI 37.22 kg/m    Wt Readings from Last 3 Encounters:  11/21/18 197 lb (89.4 kg)  11/01/18 198 lb (89.8 kg)  08/19/18 198 lb 6.4 oz (90 kg)    Physical Exam  Constitutional: She is oriented to person, place, and time. She appears well-developed and well-nourished. She is cooperative. No distress.  HENT:  Head: Normocephalic and atraumatic.  Right Ear: Hearing, external ear and ear canal normal. Tympanic membrane is not injected, not perforated and not erythematous. A middle ear effusion is present.  Left Ear: Hearing, external ear and ear canal normal. Tympanic membrane is not injected, not perforated and not erythematous. A middle ear effusion is present.  Nose: Mucosal edema and rhinorrhea present. Right sinus exhibits maxillary sinus tenderness. Left sinus exhibits maxillary sinus tenderness.  Mouth/Throat: Uvula is midline and mucous membranes are normal.  Posterior oropharyngeal erythema present. No oropharyngeal exudate or posterior oropharyngeal edema. No tonsillar exudate.  Eyes: Pupils are equal, round, and reactive to light. Conjunctivae and lids are normal.  Neck: Trachea normal and phonation normal. Neck supple. No thyroid mass and no thyromegaly present.  Cardiovascular: Normal rate, regular rhythm and normal heart sounds. Exam reveals no gallop and no friction rub.  No murmur heard. Pulmonary/Chest: Effort normal. She has wheezes (mild) in the right lower field and the left lower field.  Lymphadenopathy:    She has no cervical adenopathy.  Neurological: She is alert and oriented to person, place, and time.  Skin: Skin is warm and dry. Capillary refill takes less than 2 seconds. No cyanosis.  Psychiatric: She has a normal mood and affect. Her speech is normal and behavior is normal. Judgment and thought content normal. Cognition and memory are normal.  Nursing note and vitals reviewed.   Results for orders placed or performed in visit on 11/01/18  CBC with Differential/Platelet  Result Value Ref Range   WBC 7.6 3.4 - 10.8 x10E3/uL   RBC 5.06 3.77 - 5.28 x10E6/uL   Hemoglobin 14.6 11.1 - 15.9 g/dL   Hematocrit 43.7 34.0 - 46.6 %   MCV 86 79 - 97 fL   MCH 28.9 26.6 - 33.0 pg   MCHC 33.4 31.5 - 35.7 g/dL   RDW 13.7 12.3 - 15.4 %   Platelets 312 150 - 450 x10E3/uL   Neutrophils 59 Not Estab. %   Lymphs 28 Not Estab. %   Monocytes 8 Not Estab. %   Eos 4 Not Estab. %   Basos 1 Not Estab. %   Neutrophils Absolute 4.4 1.4 - 7.0 x10E3/uL   Lymphocytes Absolute 2.2 0.7 - 3.1 x10E3/uL   Monocytes Absolute 0.6 0.1 - 0.9 x10E3/uL   EOS (ABSOLUTE) 0.3 0.0 - 0.4 x10E3/uL   Basophils Absolute 0.1 0.0 - 0.2 x10E3/uL   Immature Granulocytes 0 Not Estab. %   Immature Grans (Abs) 0.0 0.0 - 0.1 x10E3/uL  CMP14+EGFR  Result Value Ref Range  Glucose 86 65 - 99 mg/dL   BUN 9 8 - 27 mg/dL   Creatinine, Ser 0.67 0.57 - 1.00 mg/dL   GFR calc  non Af Amer 95 >59 mL/min/1.73   GFR calc Af Amer 109 >59 mL/min/1.73   BUN/Creatinine Ratio 13 12 - 28   Sodium 143 134 - 144 mmol/L   Potassium 4.1 3.5 - 5.2 mmol/L   Chloride 103 96 - 106 mmol/L   CO2 24 20 - 29 mmol/L   Calcium 8.9 8.7 - 10.3 mg/dL   Total Protein 5.8 (L) 6.0 - 8.5 g/dL   Albumin 3.9 3.6 - 4.8 g/dL   Globulin, Total 1.9 1.5 - 4.5 g/dL   Albumin/Globulin Ratio 2.1 1.2 - 2.2   Bilirubin Total 0.3 0.0 - 1.2 mg/dL   Alkaline Phosphatase 82 39 - 117 IU/L   AST 25 0 - 40 IU/L   ALT 28 0 - 32 IU/L  Lipid panel  Result Value Ref Range   Cholesterol, Total 168 100 - 199 mg/dL   Triglycerides 129 0 - 149 mg/dL   HDL 51 >39 mg/dL   VLDL Cholesterol Cal 26 5 - 40 mg/dL   LDL Calculated 91 0 - 99 mg/dL   Chol/HDL Ratio 3.3 0.0 - 4.4 ratio  TSH  Result Value Ref Range   TSH 1.640 0.450 - 4.500 uIU/mL       Pertinent labs & imaging results that were available during my care of the patient were reviewed by me and considered in my medical decision making.  Assessment & Plan:  Alicia Russo was seen today for sinusitis and asthma.  Diagnoses and all orders for this visit:  Acute recurrent maxillary sinusitis Increase fluid intake. Flonase daily for two weeks along with Astelin. Avoid cigarette smoke. Due to recurrent sinusitis, will refer to ENT. Medications as prescribed.  -     amoxicillin-clavulanate (AUGMENTIN) 875-125 MG tablet; Take 1 tablet by mouth 2 (two) times daily for 7 days. -     Ambulatory referral to ENT  Mild intermittent asthma without complication Continue medications as prescribed. Report any new or worsening symptoms. Avoid triggers such as cigarette smoke.  -     methylPREDNISolone acetate (DEPO-MEDROL) injection 80 mg  Wheezing Continue medications as prescribed. Report any new or worsening symptoms. Avoid triggers such as cigarette smoke.  -     methylPREDNISolone acetate (DEPO-MEDROL) injection 80 mg    Continue all other maintenance  medications.  Follow up plan: Return in about 2 weeks (around 12/05/2018), or if symptoms worsen or fail to improve.  Educational handout given for sinusitis  The above assessment and management plan was discussed with the patient. The patient verbalized understanding of and has agreed to the management plan. Patient is aware to call the clinic if symptoms persist or worsen. Patient is aware when to return to the clinic for a follow-up visit. Patient educated on when it is appropriate to go to the emergency department.   Monia Pouch, FNP-C [facility] [phone]

## 2018-11-21 NOTE — Patient Instructions (Signed)

## 2018-12-01 DIAGNOSIS — J324 Chronic pansinusitis: Secondary | ICD-10-CM | POA: Diagnosis not present

## 2018-12-01 DIAGNOSIS — J329 Chronic sinusitis, unspecified: Secondary | ICD-10-CM | POA: Diagnosis not present

## 2018-12-05 ENCOUNTER — Encounter: Payer: Self-pay | Admitting: Family Medicine

## 2018-12-05 ENCOUNTER — Ambulatory Visit (INDEPENDENT_AMBULATORY_CARE_PROVIDER_SITE_OTHER): Payer: PPO | Admitting: Family Medicine

## 2018-12-05 VITALS — BP 133/77 | HR 72 | Temp 98.2°F | Ht 61.0 in | Wt 194.0 lb

## 2018-12-05 DIAGNOSIS — J324 Chronic pansinusitis: Secondary | ICD-10-CM

## 2018-12-05 DIAGNOSIS — J329 Chronic sinusitis, unspecified: Secondary | ICD-10-CM | POA: Diagnosis not present

## 2018-12-05 NOTE — Progress Notes (Signed)
Subjective:    Patient ID: Alicia Russo, female    DOB: Sep 21, 1956, 62 y.o.   MRN: [id number]  Chief Complaint:  2 Week Follow-up (sinusitits; saw ENT on Thursday, had CT scan sinuses this morning)   HPI: Alicia Russo is a 62 y.o. female presenting on 12/05/2018 for 2 Week Follow-up (sinusitits; saw ENT on Thursday, had CT scan sinuses this morning)   1. Chronic pansinusitis   Pt presents today for follow up of sinusitis and asthma. Pt was seen on 11/21/18 and placed on Augmentin. She was referred to ENT and saw them on 12/01/18 and had a sinus CT today. Pt reports she will go back to ENT for definitive treatment after the CT has been resulted. Pt states she feels better since completion of her antibiotics. States she still has some nasal congestion. Denies fever, chills, headaches, shortness of breath, chest pain, or confusion.    Relevant past medical, surgical, family, and social history reviewed and updated as indicated.  Allergies and medications reviewed and updated.   Past Medical History:  Diagnosis Date  . Anxiety   . Asthma   . AVM (arteriovenous malformation)    Intracranial with seizures  . Closed fracture of left distal radius   . Fibromyalgia   . GERD (gastroesophageal reflux disease)   . Hyperlipidemia   . Hypothyroidism   . Seizures ([place])    AVM repair    Past Surgical History:  Procedure Laterality Date  . CHOLECYSTECTOMY    . CRANIOTOMY     for AVM  . ORIF WRIST FRACTURE Left 02/16/2017   Procedure: OPEN REDUCTION INTERNAL FIXATION (ORIF) LEFT WRIST FRACTURE;  Surgeon: Renette Butters, MD;  Location: [facility];  Service: Orthopedics;  Laterality: Left;    Social History   Socioeconomic History  . Marital status: Single    Spouse name: Not on file  . Number of children: Not on file  . Years of education: Not on file  . Highest education level: Not on file  Occupational History  . Not on file  Social Needs  . Financial  resource strain: Not on file  . Food insecurity:    Worry: Not on file    Inability: Not on file  . Transportation needs:    Medical: Not on file    Non-medical: Not on file  Tobacco Use  . Smoking status: Former Smoker    Years: 20.00    Last attempt to quit: 04/30/1999    Years since quitting: 19.6  . Smokeless tobacco: Never Used  Substance and Sexual Activity  . Alcohol use: No    Alcohol/week: 0.0 standard drinks  . Drug use: No  . Sexual activity: Never  Lifestyle  . Physical activity:    Days per week: Not on file    Minutes per session: Not on file  . Stress: Not on file  Relationships  . Social connections:    Talks on phone: Not on file    Gets together: Not on file    Attends religious service: Not on file    Active member of club or organization: Not on file    Attends meetings of clubs or organizations: Not on file    Relationship status: Not on file  . Intimate partner violence:    Fear of current or ex partner: Not on file    Emotionally abused: Not on file    Physically abused: Not on file    Forced  sexual activity: Not on file  Other Topics Concern  . Not on file  Social History Narrative  . Not on file    Outpatient Encounter Medications as of 12/05/2018  Medication Sig  . albuterol (PROVENTIL) (2.5 MG/3ML) 0.083% nebulizer solution USE ONE vial in nebulizer EVERY 6 HOURS AS NEEDED wheezing SHORTNESS OF BREATH  . albuterol (VENTOLIN HFA) 108 (90 Base) MCG/ACT inhaler INHALE 2 PUFFS INTO THE LUNGS EVERY 6 HOURS AS NEEDED FOR WHEEZING OR SHORTNESS OF BREATH.  Marland Kitchen azelastine (ASTELIN) 0.1 % nasal spray Place 2 sprays into both nostrils 2 (two) times daily. Use in each nostril as directed  . benzonatate (TESSALON) 100 MG capsule Take 100 mg by mouth 2 (two) times daily as needed for cough.  . cetirizine (ZYRTEC) 10 MG tablet Take 1 tablet (10 mg total) by mouth daily.  . chlorpheniramine-HYDROcodone (TUSSIONEX) 10-8 MG/5ML SUER Take 5 mLs by mouth every 12  (twelve) hours as needed for cough.  . Cholecalciferol (VITAMIN D3) 2000 UNITS TABS Take 1 tablet by mouth 2 (two) times daily.  . Cyanocobalamin (VITAMIN B-12 PO) Take 1 tablet by mouth 2 (two) times daily.  . fluticasone furoate-vilanterol (BREO ELLIPTA) 100-25 MCG/INH AEPB Inhale 1 puff into the lungs daily.  Marland Kitchen ibuprofen (ADVIL,MOTRIN) 800 MG tablet TAKE ONE TABLET BY MOUTH THREE TIMES DAILY (Patient taking differently: TAKE ONE TABLET BY MOUTH THREE TIMES DAILY prn)  . levothyroxine (SYNTHROID, LEVOTHROID) 137 MCG tablet Take 1 tablet (137 mcg total) by mouth daily.  Marland Kitchen losartan (COZAAR) 50 MG tablet Take 1 tablet (50 mg total) by mouth daily.  . magnesium 30 MG tablet Take 30 mg by mouth 2 (two) times daily.  . methocarbamol (ROBAXIN) 500 MG tablet TAKE 1 TABLET BY MOUTH EVERY 6 HOURS AS NEEDED FOR MUSCLE SPASMS  . Milnacipran HCl (SAVELLA) 100 MG TABS tablet Take 1 tablet (100 mg total) by mouth 2 (two) times daily.  . montelukast (SINGULAIR) 10 MG tablet Take 1 tablet (10 mg total) by mouth every evening.  . Multiple Vitamin (MULTIVITAMIN) tablet Take 1 tablet by mouth daily.  Marland Kitchen omeprazole (PRILOSEC) 20 MG capsule Take 1 capsule (20 mg total) by mouth daily.  . SUPER B COMPLEX/C PO Take 1 tablet by mouth 2 (two) times daily.  Marland Kitchen aspirin 81 MG EC tablet Take 81 mg by mouth 2 (two) times daily.   . Biotin 10 MG TABS Take 1 tablet by mouth 2 (two) times daily.   . [DISCONTINUED] zinc gluconate 50 MG tablet Take 50 mg by mouth daily.   No facility-administered encounter medications on file as of 12/05/2018.     Allergies  Allergen Reactions  . Bee Venom     Cant breath, swell up  . Cherry Flavor Hives      dizziness  . Citalopram Hydrobromide     Mood swings, gets "really really mean"  . Tramadol     unknown    Review of Systems  Constitutional: Negative for chills, fatigue and fever.  HENT: Positive for congestion and rhinorrhea. Negative for ear pain, sinus pressure and sinus  pain.   Respiratory: Positive for cough. Negative for shortness of breath and wheezing.   Cardiovascular: Negative for chest pain and palpitations.  Musculoskeletal: Positive for myalgias (chronic).  Neurological: Negative for dizziness, weakness and headaches.  Psychiatric/Behavioral: Negative for confusion.  All other systems reviewed and are negative.       Objective:    BP 133/77   Pulse 72   Temp  98.2 F (36.8 C) (Oral)   Ht '5\' 1"'  (1.549 m)   Wt 194 lb (88 kg)   BMI 36.66 kg/m    Wt Readings from Last 3 Encounters:  12/05/18 194 lb (88 kg)  11/21/18 197 lb (89.4 kg)  11/01/18 198 lb (89.8 kg)    Physical Exam Vitals signs and nursing note reviewed.  Constitutional:      General: She is not in acute distress.    Appearance: Normal appearance. She is well-developed and well-groomed. She is obese. She is not ill-appearing.  HENT:     Head: Normocephalic and atraumatic.     Right Ear: Hearing, ear canal and external ear normal. A middle ear effusion is present. Tympanic membrane is not perforated or erythematous.     Left Ear: Hearing, ear canal and external ear normal. A middle ear effusion is present. Tympanic membrane is not perforated or erythematous.     Nose: Congestion and rhinorrhea present. Rhinorrhea is clear.     Right Sinus: No maxillary sinus tenderness or frontal sinus tenderness.     Left Sinus: No maxillary sinus tenderness or frontal sinus tenderness.     Mouth/Throat:     Lips: Pink.     Mouth: Mucous membranes are moist.     Pharynx: Oropharynx is clear.  Eyes:     General: Lids are normal.     Conjunctiva/sclera: Conjunctivae normal.     Pupils: Pupils are equal, round, and reactive to light.  Neck:     Musculoskeletal: Neck supple.     Trachea: Trachea and phonation normal.  Cardiovascular:     Rate and Rhythm: Normal rate and regular rhythm.     Heart sounds: Normal heart sounds. No murmur. No friction rub. No gallop.   Pulmonary:      Effort: Pulmonary effort is normal. No respiratory distress.     Breath sounds: Examination of the right-upper field reveals wheezing. Examination of the left-upper field reveals wheezing. Wheezing (mild) present.  Skin:    General: Skin is warm and dry.     Capillary Refill: Capillary refill takes less than 2 seconds.  Neurological:     General: No focal deficit present.     Mental Status: She is alert and oriented to person, place, and time.  Psychiatric:        Mood and Affect: Mood normal.        Behavior: Behavior normal.        Thought Content: Thought content normal.        Judgment: Judgment normal.     Results for orders placed or performed in visit on 11/01/18  CBC with Differential/Platelet  Result Value Ref Range   WBC 7.6 3.4 - 10.8 x10E3/uL   RBC 5.06 3.77 - 5.28 x10E6/uL   Hemoglobin 14.6 11.1 - 15.9 g/dL   Hematocrit 43.7 34.0 - 46.6 %   MCV 86 79 - 97 fL   MCH 28.9 26.6 - 33.0 pg   MCHC 33.4 31.5 - 35.7 g/dL   RDW 13.7 12.3 - 15.4 %   Platelets 312 150 - 450 x10E3/uL   Neutrophils 59 Not Estab. %   Lymphs 28 Not Estab. %   Monocytes 8 Not Estab. %   Eos 4 Not Estab. %   Basos 1 Not Estab. %   Neutrophils Absolute 4.4 1.4 - 7.0 x10E3/uL   Lymphocytes Absolute 2.2 0.7 - 3.1 x10E3/uL   Monocytes Absolute 0.6 0.1 - 0.9 x10E3/uL   EOS (ABSOLUTE) 0.3  0.0 - 0.4 x10E3/uL   Basophils Absolute 0.1 0.0 - 0.2 x10E3/uL   Immature Granulocytes 0 Not Estab. %   Immature Grans (Abs) 0.0 0.0 - 0.1 x10E3/uL  CMP14+EGFR  Result Value Ref Range   Glucose 86 65 - 99 mg/dL   BUN 9 8 - 27 mg/dL   Creatinine, Ser 0.67 0.57 - 1.00 mg/dL   GFR calc non Af Amer 95 >59 mL/min/1.73   GFR calc Af Amer 109 >59 mL/min/1.73   BUN/Creatinine Ratio 13 12 - 28   Sodium 143 134 - 144 mmol/L   Potassium 4.1 3.5 - 5.2 mmol/L   Chloride 103 96 - 106 mmol/L   CO2 24 20 - 29 mmol/L   Calcium 8.9 8.7 - 10.3 mg/dL   Total Protein 5.8 (L) 6.0 - 8.5 g/dL   Albumin 3.9 3.6 - 4.8 g/dL    Globulin, Total 1.9 1.5 - 4.5 g/dL   Albumin/Globulin Ratio 2.1 1.2 - 2.2   Bilirubin Total 0.3 0.0 - 1.2 mg/dL   Alkaline Phosphatase 82 39 - 117 IU/L   AST 25 0 - 40 IU/L   ALT 28 0 - 32 IU/L  Lipid panel  Result Value Ref Range   Cholesterol, Total 168 100 - 199 mg/dL   Triglycerides 129 0 - 149 mg/dL   HDL 51 >39 mg/dL   VLDL Cholesterol Cal 26 5 - 40 mg/dL   LDL Calculated 91 0 - 99 mg/dL   Chol/HDL Ratio 3.3 0.0 - 4.4 ratio  TSH  Result Value Ref Range   TSH 1.640 0.450 - 4.500 uIU/mL       Pertinent labs & imaging results that were available during Alicia care of the patient were reviewed by me and considered in Alicia medical decision making.  Assessment & Plan:  Elton was seen today for 2 week follow-up.  Diagnoses and all orders for this visit:  Chronic pansinusitis Keep follow up appointment with ENT. Report any new or worsening symptoms. Cleaning of nebulizer discussed. Continue medications as prescribed.    Continue all other maintenance medications.  Follow up plan: Return if symptoms worsen or fail to improve.  Educational handout given for sinusitis   The above assessment and management plan was discussed with the patient. The patient verbalized understanding of and has agreed to the management plan. Patient is aware to call the clinic if symptoms persist or worsen. Patient is aware when to return to the clinic for a follow-up visit. Patient educated on when it is appropriate to go to the emergency department.   Monia Pouch, FNP-C [facility] [phone]

## 2018-12-05 NOTE — Patient Instructions (Signed)

## 2018-12-09 IMAGING — DX DG WRIST COMPLETE 3+V*L*
3 series · 3 of 3 positions shown · non-contrast
Comparison: None.

CLINICAL DATA: Pain following fall

EXAM:
LEFT WRIST - COMPLETE 3+ VIEW

[wrist pa]
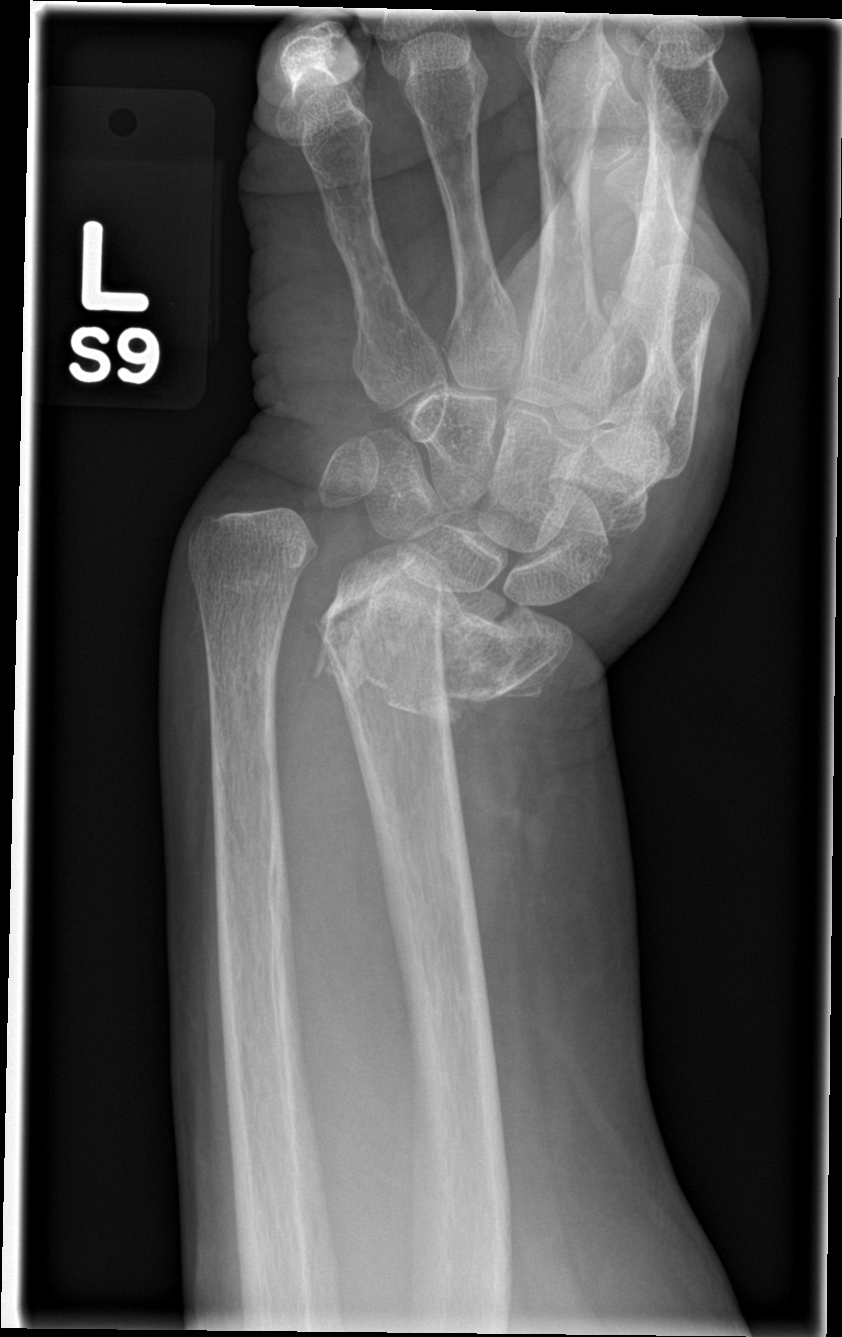

[wrist obl]
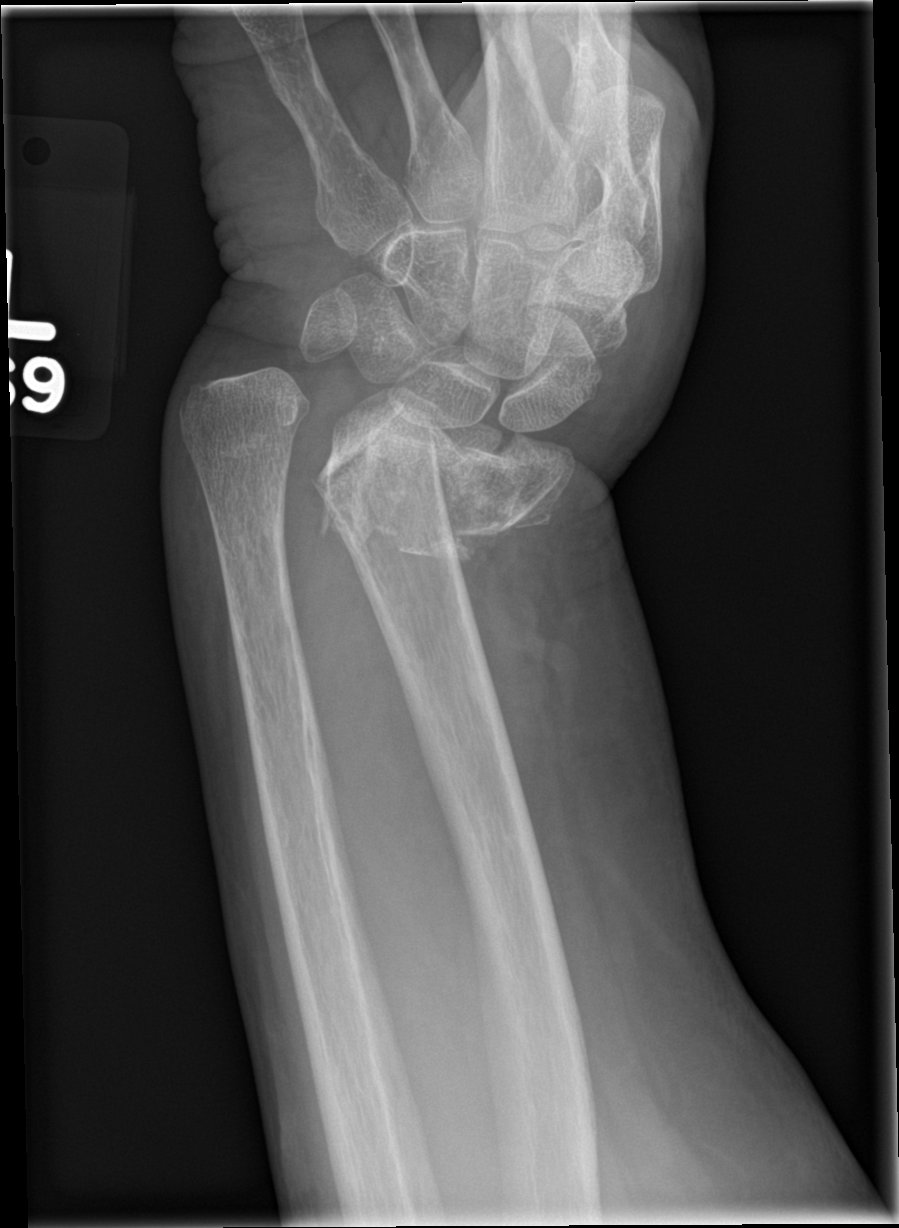

[wrist lat]
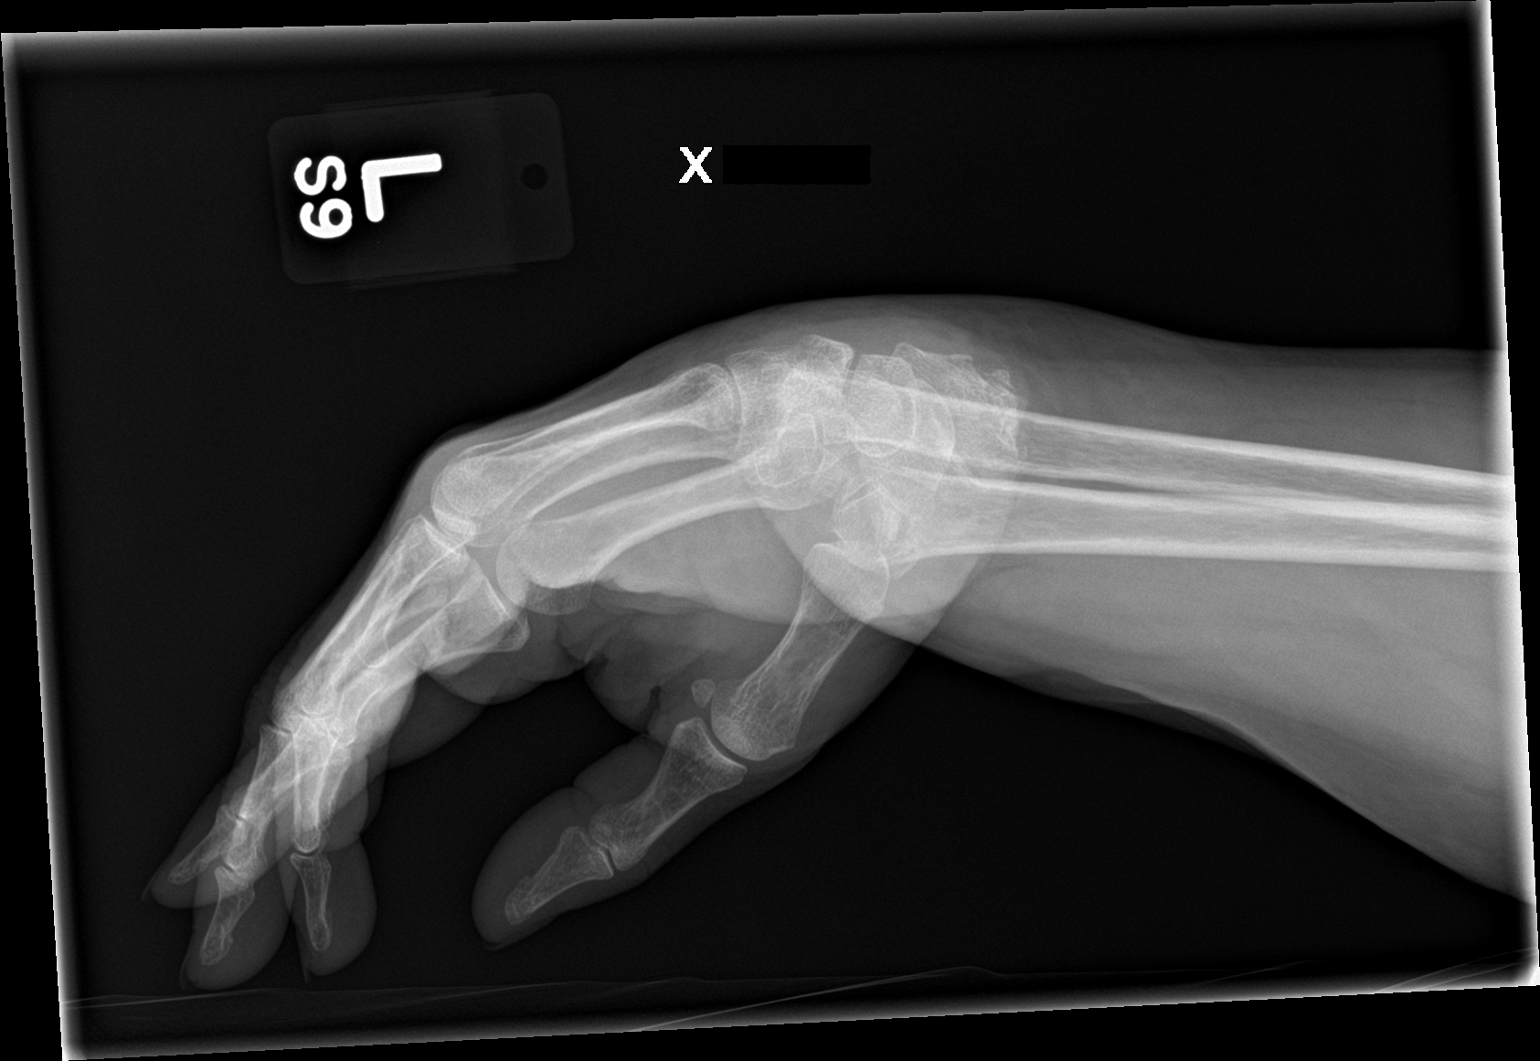

[3 of 3 positions shown; findings below may reference images not displayed]

FINDINGS: Frontal, oblique, and lateral views were obtained. There is a
comminuted fracture of the distal radial metaphysis with multiple
fracture fragments extending into the radiocarpal joint. There is
lateral displacement as well as lateral angulation of distal major
fracture fragments with respect proximal fragment. No gross
dislocation. There does appear to be separation of the distal
radioulnar syndesmosis. No joint space narrowing.
IMPRESSION: Comminuted fracture distal radius with angulated and displaced major
fracture fragments. Fracture fragments extend into the radiocarpal
joint. Suspect disruption of the distal radioulnar syndesmosis. No
frank dislocation. No appreciable arthropathy.

## 2018-12-20 ENCOUNTER — Encounter: Payer: Self-pay | Admitting: Family Medicine

## 2018-12-20 ENCOUNTER — Ambulatory Visit (INDEPENDENT_AMBULATORY_CARE_PROVIDER_SITE_OTHER): Payer: PPO | Admitting: Family Medicine

## 2018-12-20 VITALS — BP 137/81 | HR 88 | Temp 98.9°F | Ht 61.0 in | Wt 193.0 lb

## 2018-12-20 DIAGNOSIS — J029 Acute pharyngitis, unspecified: Secondary | ICD-10-CM

## 2018-12-20 DIAGNOSIS — J020 Streptococcal pharyngitis: Secondary | ICD-10-CM | POA: Diagnosis not present

## 2018-12-20 LAB — RAPID STREP SCREEN (MED CTR MEBANE ONLY): Strep Gp A Ag, IA W/Reflex: POSITIVE — AB

## 2018-12-20 MED ORDER — PENICILLIN V POTASSIUM 500 MG PO TABS: 500.0000 mg | ORAL_TABLET | Freq: Three times a day (TID) | ORAL | 0 refills | Status: AC

## 2018-12-20 NOTE — Progress Notes (Signed)
    Subjective:     Alicia Russo is a 63 y.o. female who presents for evaluation of sore throat. Associated symptoms include low grade fevers, chills, hot and cold spells and fatigue, chills, coryza and sore throat. Onset of symptoms was 3 days ago, and have been gradually worsening since that time. She is drinking moderate amounts of fluids. She is unsure if she has had a recent close exposure to someone with proven streptococcal pharyngitis.  The following portions of the patient's history were reviewed and updated as appropriate: allergies, current medications, past family history, past medical history, past social history, past surgical history and problem list.  Review of Systems Constitutional: positive for chills, fatigue and fevers Eyes: negative Ears, nose, mouth, throat, and face: positive for sore throat Respiratory: positive for cough Cardiovascular: negative Gastrointestinal: negative Neurological: negative    Objective:    BP 137/81   Pulse 88   Temp 98.9 F (37.2 C) (Oral)   Ht 5\' 1"  (1.549 m)   Wt 193 lb (87.5 kg)   BMI 36.47 kg/m  General appearance: alert, cooperative and no distress Head: Normocephalic, without obvious abnormality, atraumatic Eyes: negative Ears: abnormal TM right ear - serous middle ear fluid and abnormal TM left ear - serous middle ear fluid Nose: Nares normal. Septum midline. Mucosa normal. No drainage or sinus tenderness. Throat: abnormal findings: exudates present and moderate oropharyngeal erythema Neck: mild anterior cervical adenopathy, no carotid bruit, no JVD, supple, symmetrical, trachea midline and thyroid not enlarged, symmetric, no tenderness/mass/nodules Lungs: clear to auscultation bilaterally Heart: regular rate and rhythm, S1, S2 normal, no murmur, click, rub or gallop Skin: Skin color, texture, turgor normal. No rashes or lesions Lymph nodes: Cervical adenopathy: mild Neurologic: Grossly normal  Laboratory Strep test  done. Results:positive.    Assessment:   Alicia Russo was seen today for uri.  Diagnoses and all orders for this visit:  Streptococcal sore throat Adequate hydration. Tylenol as needed for fever and pain control. Medications as prescribed. Change toothbrush in 3 days and then at completion of antibiotics.  -     penicillin v potassium (VEETID) 500 MG tablet; Take 1 tablet (500 mg total) by mouth 3 (three) times daily for 10 days.  Sore throat -     Rapid Strep Screen ([facility] ONLY)     Plan:    Patient placed on antibiotics. Use of OTC analgesics recommended as well as salt water gargles. Patient advised of the risk of peritonsillar abscess formation. Patient advised that he will be infectious for 24 hours after starting antibiotics. Follow up as needed.    The above assessment and management plan was discussed with the patient. The patient verbalized understanding of and has agreed to the management plan. Patient is aware to call the clinic if symptoms fail to improve or worsen. Patient is aware when to return to the clinic for a follow-up visit. Patient educated on when it is appropriate to go to the emergency department.   Kari Baars, FNP-C [facility] [address] [place], [place] [address] [phone]

## 2018-12-20 NOTE — Patient Instructions (Addendum)
Pharyngitis  Pharyngitis is redness, pain, and swelling (inflammation) of the throat (pharynx). It is a very common cause of sore throat. Pharyngitis can be caused by a bacteria, but it is usually caused by a virus. Most cases of pharyngitis get better on their own without treatment. What are the causes? This condition may be caused by:  Infection by viruses (viral). Viral pharyngitis spreads from person to person (is contagious) through coughing, sneezing, and sharing of personal items or utensils such as cups, forks, spoons, and toothbrushes.  Infection by bacteria (bacterial). Bacterial pharyngitis may be spread by touching the nose or face after coming in contact with the bacteria, or through more intimate contact, such as kissing.  Allergies. Allergies can cause buildup of mucus in the throat (post-nasal drip), leading to inflammation and irritation. Allergies can also cause blocked nasal passages, forcing breathing through the mouth, which dries and irritates the throat. What increases the risk? You are more likely to develop this condition if:  You are 60-72 years old.  You are exposed to crowded environments such as daycare, school, or dormitory living.  You live in a cold climate.  You have a weakened disease-fighting (immune) system. What are the signs or symptoms? Symptoms of this condition vary by the cause (viral, bacterial, or allergies) and can include:  Sore throat.  Fatigue.  Low-grade fever.  Headache.  Joint pain and muscle aches.  Skin rashes.  Swollen glands in the throat (lymph nodes).  Plaque-like film on the throat or tonsils. This is often a symptom of bacterial pharyngitis.  Vomiting.  Stuffy nose (nasal congestion).  Cough.  Red, itchy eyes (conjunctivitis).  Loss of appetite. How is this diagnosed? This condition is often diagnosed based on your medical history and a physical exam. Your health care provider will ask you questions about  your illness and your symptoms. A swab of your throat may be done to check for bacteria (rapid strep test). Other lab tests may also be done, depending on the suspected cause, but these are rare. How is this treated? This condition usually gets better in 3-4 days without medicine. Bacterial pharyngitis may be treated with antibiotic medicines. Follow these instructions at home:  Take over-the-counter and prescription medicines only as told by your health care provider. ? If you were prescribed an antibiotic medicine, take it as told by your health care provider. Do not stop taking the antibiotic even if you start to feel better. ? Do not give children aspirin because of the association with Reye syndrome.  Drink enough water and fluids to keep your urine clear or pale yellow.  Get a lot of rest.  Gargle with a salt-water mixture 3-4 times a day or as needed. To make a salt-water mixture, completely dissolve -1 tsp of salt in 1 cup of warm water.  If your health care provider approves, you may use throat lozenges or sprays to soothe your throat. Contact a health care provider if:  You have large, tender lumps in your neck.  You have a rash.  You cough up green, yellow-brown, or bloody spit. Get help right away if:  Your neck becomes stiff.  You drool or are unable to swallow liquids.  You cannot drink or take medicines without vomiting.  You have severe pain that does not go away, even after you take medicine.  You have trouble breathing, and it is not caused by a stuffy nose.  You have new pain and swelling in your joints such  as the knees, ankles, wrists, or elbows. Summary  Pharyngitis is redness, pain, and swelling (inflammation) of the throat (pharynx).  While pharyngitis can be caused by a bacteria, the most common causes are viral.  Most cases of pharyngitis get better on their own without treatment.  Bacterial pharyngitis is treated with antibiotic medicines. This  information is not intended to replace advice given to you by your health care provider. Make sure you discuss any questions you have with your health care provider. Document Released: 11/30/2005 Document Revised: 01/05/2017 Document Reviewed: 01/05/2017 Elsevier Interactive Patient Education  2019 [employer]  Strep Throat  Strep throat is an infection of the throat. It is caused by germs. Strep throat spreads from person to person because of coughing, sneezing, or close contact. Follow these instructions at home: Medicines  Take over-the-counter and prescription medicines only as told by your doctor.  Take your antibiotic medicine as told by your doctor. Do not stop taking the medicine even if you feel better.  Have family members who also have a sore throat or fever go to a doctor. Eating and drinking  Do not share food, drinking cups, or personal items.  Try eating soft foods until your sore throat feels better.  Drink enough fluid to keep your pee (urine) clear or pale yellow. General instructions  Rinse your mouth (gargle) with a salt-water mixture 3-4 times per day or as needed. To make a salt-water mixture, stir -1 tsp of salt into 1 cup of warm water.  Make sure that all people in your house wash their hands well.  Rest.  Stay home from school or work until you have been taking antibiotics for 24 hours.  Keep all follow-up visits as told by your doctor. This is important. Contact a doctor if:  Your neck keeps getting bigger.  You get a rash, cough, or earache.  You cough up thick liquid that is green, yellow-brown, or bloody.  You have pain that does not get better with medicine.  Your problems get worse instead of getting better.  You have a fever. Get help right away if:  You throw up (vomit).  You get a very bad headache.  You neck hurts or it feels stiff.  You have chest pain or you are short of breath.  You have drooling, very bad throat pain,  or changes in your voice.  Your neck is swollen or the skin gets red and tender.  Your mouth is dry or you are peeing less than normal.  You keep feeling more tired or it is hard to wake up.  Your joints are red or they hurt. This information is not intended to replace advice given to you by your health care provider. Make sure you discuss any questions you have with your health care provider. Document Released: 05/18/2008 Document Revised: 07/29/2016 Document Reviewed: 03/25/2015 Elsevier Interactive Patient Education  [employer].

## 2019-02-24 ENCOUNTER — Ambulatory Visit (INDEPENDENT_AMBULATORY_CARE_PROVIDER_SITE_OTHER): Payer: PPO | Admitting: Family Medicine

## 2019-02-24 ENCOUNTER — Encounter: Payer: Self-pay | Admitting: Family Medicine

## 2019-02-24 ENCOUNTER — Other Ambulatory Visit: Payer: Self-pay

## 2019-02-24 VITALS — BP 146/91 | HR 103 | Temp 101.3°F | Ht 61.0 in | Wt 195.0 lb

## 2019-02-24 DIAGNOSIS — J069 Acute upper respiratory infection, unspecified: Secondary | ICD-10-CM

## 2019-02-24 DIAGNOSIS — R509 Fever, unspecified: Secondary | ICD-10-CM | POA: Diagnosis not present

## 2019-02-24 LAB — VERITOR FLU A/B WAIVED
Influenza A: NEGATIVE
Influenza B: NEGATIVE

## 2019-02-24 MED ORDER — AMOXICILLIN-POT CLAVULANATE 875-125 MG PO TABS: 1.0000 | ORAL_TABLET | Freq: Two times a day (BID) | ORAL | 0 refills | Status: AC

## 2019-02-24 MED ORDER — BENZONATATE 100 MG PO CAPS: 100.0000 mg | ORAL_CAPSULE | Freq: Three times a day (TID) | ORAL | 0 refills | Status: DC | PRN

## 2019-02-24 NOTE — Progress Notes (Signed)
    Subjective:     Alicia Russo is a 63 y.o. female who presents for evaluation of symptoms of a URI, possible sinusitis. Symptoms include bilateral ear pressure/pain, achiness, congestion, coryza, cough described as nonproductive, fever 101, fever-duration 1  day, nasal congestion, sinus pressure and sore throat. Onset of symptoms was 2 days ago, and has been stable since that time. Treatment to date: cough suppressants.  The following portions of the patient's history were reviewed and updated as appropriate: allergies, current medications, past family history, past medical history, past social history, past surgical history and problem list.  Review of Systems Pertinent items noted in HPI and remainder of comprehensive ROS otherwise negative.   Objective:    BP (!) 146/91   Pulse (!) 103   Temp (!) 101.3 F (38.5 C) (Oral)   Ht 5\' 1"  (1.549 m)   Wt 195 lb (88.5 kg)   SpO2 98%   BMI 36.84 kg/m  General appearance: alert, cooperative and mild distress Head: Normocephalic, without obvious abnormality, atraumatic Eyes: negative Ears: abnormal TM right ear - serous middle ear fluid and abnormal TM left ear - serous middle ear fluid Nose: scant and yellow discharge, mild congestion, turbinates red, swollen, sinus tenderness , mild Throat: abnormal findings: mild oropharyngeal erythema Neck: no adenopathy, no carotid bruit, no JVD, supple, symmetrical, trachea midline and thyroid not enlarged, symmetric, no tenderness/mass/nodules Lungs: clear to auscultation bilaterally Heart: regular rate and rhythm, S1, S2 normal, no murmur, click, rub or gallop Skin: Skin color, texture, turgor normal. No rashes or lesions Neurologic: Grossly normal   Negative influenza swab in office today Assessment:   Alicia Russo was seen today for cough,shortness of breath, sinus drainage, scratchy throat.  Diagnoses and all orders for this visit:  URI with cough and congestion Symptomatic care discussed.  Due to sinus tenderness, fever, and throat erythema, will treat with Augmentin.  -     amoxicillin-clavulanate (AUGMENTIN) 875-125 MG tablet; Take 1 tablet by mouth 2 (two) times daily for 7 days. -     benzonatate (TESSALON PERLES) 100 MG capsule; Take 1 capsule (100 mg total) by mouth 3 (three) times daily as needed for cough.  Fever and chills Symptomatic care discussed. Report any new or worsening symptoms. -     Veritor Flu A/B Waived -     amoxicillin-clavulanate (AUGMENTIN) 875-125 MG tablet; Take 1 tablet by mouth 2 (two) times daily for 7 days.     Plan:    Discussed diagnosis and treatment of URI. Discussed the diagnosis and treatment of sinusitis. Suggested symptomatic OTC remedies. Nasal saline spray for congestion. Augmentin per orders. Follow up as needed. Call in 3 days if symptoms aren't resolving.    The above assessment and management plan was discussed with the patient. The patient verbalized understanding of and has agreed to the management plan. Patient is aware to call the clinic if symptoms fail to improve or worsen. Patient is aware when to return to the clinic for a follow-up visit. Patient educated on when it is appropriate to go to the emergency department.   Kari Baars, FNP-C [facility] [address] [place], [place] [address] [phone]

## 2019-02-24 NOTE — Patient Instructions (Signed)

## 2019-03-06 ENCOUNTER — Encounter: Payer: Self-pay | Admitting: Physician Assistant

## 2019-03-07 ENCOUNTER — Other Ambulatory Visit: Payer: Self-pay | Admitting: Family Medicine

## 2019-03-07 DIAGNOSIS — J069 Acute upper respiratory infection, unspecified: Secondary | ICD-10-CM

## 2019-03-07 MED ORDER — DEXTROMETHORPHAN HBR 15 MG/5ML PO SYRP: 10.0000 mL | ORAL_SOLUTION | Freq: Four times a day (QID) | ORAL | 0 refills | Status: DC | PRN

## 2019-09-11 ENCOUNTER — Ambulatory Visit (INDEPENDENT_AMBULATORY_CARE_PROVIDER_SITE_OTHER): Payer: PPO | Admitting: Family Medicine

## 2019-09-11 DIAGNOSIS — J0110 Acute frontal sinusitis, unspecified: Secondary | ICD-10-CM | POA: Diagnosis not present

## 2019-09-11 DIAGNOSIS — J329 Chronic sinusitis, unspecified: Secondary | ICD-10-CM | POA: Diagnosis not present

## 2019-09-11 DIAGNOSIS — J40 Bronchitis, not specified as acute or chronic: Secondary | ICD-10-CM

## 2019-09-11 MED ORDER — PREDNISONE 10 MG (21) PO TBPK: ORAL_TABLET | ORAL | 0 refills | Status: DC

## 2019-09-11 MED ORDER — AMOXICILLIN-POT CLAVULANATE 875-125 MG PO TABS: 1.0000 | ORAL_TABLET | Freq: Two times a day (BID) | ORAL | 0 refills | Status: DC

## 2019-09-11 NOTE — Patient Instructions (Signed)

## 2019-09-11 NOTE — Progress Notes (Signed)
Telephone visit  Subjective: CC: URI PCP: Remus Loffler, PA-C Alicia Russo is a 63 y.o. female calls for telephone consult today. Patient provides verbal consent for consult held via phone.  Location of patient: home Location of provider: Working remotely from home Others present for call: none  1. URI Patient reports sinus infection for about 2-3 weeks.  She has been using OTC Mucinex-D and sinus rinses, which help some but do not resolve.  She uses Zyrtec, Singulair.  She reports congestions, post nasal drip, nausea.  No purulence from nose.  She has been staying at home.  She had testing done at [facility] drug for COVID-19 and it was negative.  No known sick contact.  Has shortness of breath and wheezing.  She has a nonproductive cough.  She has asthma but is using her Albuterol, which is helping.   ROS: Per HPI  Allergies  Allergen Reactions  . Bee Venom     Cant breath, swell up  . Cherry Flavor Hives      dizziness  . Citalopram Hydrobromide     Mood swings, gets "really really mean"  . Tramadol     unknown   Past Medical History:  Diagnosis Date  . Anxiety   . Asthma   . AVM (arteriovenous malformation)    Intracranial with seizures  . Closed fracture of left distal radius   . Fibromyalgia   . GERD (gastroesophageal reflux disease)   . Hyperlipidemia   . Hypothyroidism   . Seizures ([facility])    AVM repair    Current Outpatient Medications:  .  albuterol (PROVENTIL) (2.5 MG/3ML) 0.083% nebulizer solution, USE ONE vial in nebulizer EVERY 6 HOURS AS NEEDED wheezing SHORTNESS OF BREATH, Disp: 240 mL, Rfl: 5 .  albuterol (VENTOLIN HFA) 108 (90 Base) MCG/ACT inhaler, INHALE 2 PUFFS INTO THE LUNGS EVERY 6 HOURS AS NEEDED FOR WHEEZING OR SHORTNESS OF BREATH., Disp: 18 g, Rfl: 5 .  aspirin 81 MG EC tablet, Take 81 mg by mouth 2 (two) times daily. , Disp: , Rfl:  .  azelastine (ASTELIN) 0.1 % nasal spray, Place 2 sprays into both nostrils 2 (two) times daily. Use in each  nostril as directed, Disp: 30 mL, Rfl: 12 .  benzonatate (TESSALON PERLES) 100 MG capsule, Take 1 capsule (100 mg total) by mouth 3 (three) times daily as needed for cough., Disp: 20 capsule, Rfl: 0 .  Biotin 10 MG TABS, Take 1 tablet by mouth 2 (two) times daily. , Disp: , Rfl:  .  cetirizine (ZYRTEC) 10 MG tablet, Take 1 tablet (10 mg total) by mouth daily., Disp: 90 tablet, Rfl: 3 .  chlorpheniramine-HYDROcodone (TUSSIONEX) 10-8 MG/5ML SUER, Take 5 mLs by mouth every 12 (twelve) hours as needed for cough., Disp: 120 mL, Rfl: 0 .  Cholecalciferol (VITAMIN D3) 2000 UNITS TABS, Take 1 tablet by mouth 2 (two) times daily., Disp: , Rfl:  .  Cyanocobalamin (VITAMIN B-12 PO), Take 1 tablet by mouth 2 (two) times daily., Disp: , Rfl:  .  dextromethorphan 15 MG/5ML syrup, Take 10 mLs (30 mg total) by mouth 4 (four) times daily as needed for cough., Disp: 120 mL, Rfl: 0 .  fluticasone furoate-vilanterol (BREO ELLIPTA) 100-25 MCG/INH AEPB, Inhale 1 puff into the lungs daily., Disp: 60 each, Rfl: 1 .  ibuprofen (ADVIL,MOTRIN) 800 MG tablet, TAKE ONE TABLET BY MOUTH THREE TIMES DAILY (Patient taking differently: TAKE ONE TABLET BY MOUTH THREE TIMES DAILY prn), Disp: 90 tablet, Rfl: 2 .  levothyroxine (SYNTHROID, LEVOTHROID) 137 MCG tablet, Take 1 tablet (137 mcg total) by mouth daily., Disp: 30 tablet, Rfl: 11 .  losartan (COZAAR) 50 MG tablet, Take 1 tablet (50 mg total) by mouth daily., Disp: 90 tablet, Rfl: 3 .  magnesium 30 MG tablet, Take 30 mg by mouth 2 (two) times daily., Disp: , Rfl:  .  methocarbamol (ROBAXIN) 500 MG tablet, TAKE 1 TABLET BY MOUTH EVERY 6 HOURS AS NEEDED FOR MUSCLE SPASMS, Disp: 120 tablet, Rfl: 2 .  Milnacipran HCl (SAVELLA) 100 MG TABS tablet, Take 1 tablet (100 mg total) by mouth 2 (two) times daily., Disp: 60 tablet, Rfl: 11 .  montelukast (SINGULAIR) 10 MG tablet, Take 1 tablet (10 mg total) by mouth every evening., Disp: 90 tablet, Rfl: 3 .  Multiple Vitamin (MULTIVITAMIN)  tablet, Take 1 tablet by mouth daily., Disp: , Rfl:  .  omeprazole (PRILOSEC) 20 MG capsule, Take 1 capsule (20 mg total) by mouth daily., Disp: 30 capsule, Rfl: 11 .  SUPER B COMPLEX/C PO, Take 1 tablet by mouth 2 (two) times daily., Disp: , Rfl:   Assessment/ Plan: 63 y.o. female   1. Subacute frontal sinusitis Given ongoing symptoms despite several over-the-counter and prescription treatments, will empirically treat with oral antibiotics and prescribe corticosteroids as well given what sounds to be a mild asthma exacerbation.  Home care instructions reviewed with the patient.  Okay to continue current therapies at home.  We discussed red flag signs and symptoms warranting further evaluation.  Low threshold to retest her for COVID-19 if symptoms or not responding as expected or if she develops any other worrisome symptoms or signs.  She voiced good understanding of the plan and will follow-up PRN - predniSONE (STERAPRED UNI-PAK 21 TAB) 10 MG (21) TBPK tablet; As directed x 6 days  Dispense: 21 tablet; Refill: 0 - amoxicillin-clavulanate (AUGMENTIN) 875-125 MG tablet; Take 1 tablet by mouth 2 (two) times daily.  Dispense: 20 tablet; Refill: 0  2. Sinobronchitis - predniSONE (STERAPRED UNI-PAK 21 TAB) 10 MG (21) TBPK tablet; As directed x 6 days  Dispense: 21 tablet; Refill: 0 - amoxicillin-clavulanate (AUGMENTIN) 875-125 MG tablet; Take 1 tablet by mouth 2 (two) times daily.  Dispense: 20 tablet; Refill: 0   Start time: 8:29am End time: 8:37am  Total time spent on patient care (including telephone call/ virtual visit): 15 minutes  [place], [place] [phone]

## 2019-11-09 ENCOUNTER — Other Ambulatory Visit: Payer: Self-pay | Admitting: Physician Assistant

## 2019-11-09 DIAGNOSIS — E039 Hypothyroidism, unspecified: Secondary | ICD-10-CM

## 2019-11-09 DIAGNOSIS — I10 Essential (primary) hypertension: Secondary | ICD-10-CM

## 2019-11-15 ENCOUNTER — Other Ambulatory Visit: Payer: Self-pay | Admitting: *Deleted

## 2019-11-15 DIAGNOSIS — I10 Essential (primary) hypertension: Secondary | ICD-10-CM

## 2019-11-15 DIAGNOSIS — E7800 Pure hypercholesterolemia, unspecified: Secondary | ICD-10-CM

## 2019-11-15 DIAGNOSIS — E039 Hypothyroidism, unspecified: Secondary | ICD-10-CM

## 2019-11-16 ENCOUNTER — Other Ambulatory Visit: Payer: Self-pay

## 2019-11-16 ENCOUNTER — Other Ambulatory Visit: Payer: PPO

## 2019-11-16 DIAGNOSIS — E039 Hypothyroidism, unspecified: Secondary | ICD-10-CM | POA: Diagnosis not present

## 2019-11-16 DIAGNOSIS — I10 Essential (primary) hypertension: Secondary | ICD-10-CM | POA: Diagnosis not present

## 2019-11-16 DIAGNOSIS — E7800 Pure hypercholesterolemia, unspecified: Secondary | ICD-10-CM

## 2019-11-17 ENCOUNTER — Telehealth: Payer: Self-pay | Admitting: Physician Assistant

## 2019-11-17 LAB — CBC WITH DIFFERENTIAL/PLATELET
Basophils Absolute: 0.1 10*3/uL (ref 0.0–0.2)
Basos: 1 %
EOS (ABSOLUTE): 0.3 10*3/uL (ref 0.0–0.4)
Eos: 4 %
Hematocrit: 44.5 % (ref 34.0–46.6)
Hemoglobin: 15 g/dL (ref 11.1–15.9)
Immature Grans (Abs): 0 10*3/uL (ref 0.0–0.1)
Immature Granulocytes: 1 %
Lymphocytes Absolute: 2.1 10*3/uL (ref 0.7–3.1)
Lymphs: 27 %
MCH: 29.9 pg (ref 26.6–33.0)
MCHC: 33.7 g/dL (ref 31.5–35.7)
MCV: 89 fL (ref 79–97)
Monocytes Absolute: 0.6 10*3/uL (ref 0.1–0.9)
Monocytes: 8 %
Neutrophils Absolute: 4.8 10*3/uL (ref 1.4–7.0)
Neutrophils: 59 %
Platelets: 293 10*3/uL (ref 150–450)
RBC: 5.01 x10E6/uL (ref 3.77–5.28)
RDW: 13.6 % (ref 11.7–15.4)
WBC: 7.9 10*3/uL (ref 3.4–10.8)

## 2019-11-17 LAB — LIPID PANEL
Chol/HDL Ratio: 3.9 ratio (ref 0.0–4.4)
Cholesterol, Total: 205 mg/dL — ABNORMAL HIGH (ref 100–199)
HDL: 53 mg/dL (ref >39)
LDL Chol Calc (NIH): 122 mg/dL — ABNORMAL HIGH (ref 0–99)
Triglycerides: 170 mg/dL — ABNORMAL HIGH (ref 0–149)
VLDL Cholesterol Cal: 30 mg/dL (ref 5–40)

## 2019-11-17 LAB — CMP14+EGFR
ALT: 12 IU/L (ref 0–32)
AST: 11 IU/L (ref 0–40)
Albumin/Globulin Ratio: 1.7 (ref 1.2–2.2)
Albumin: 3.8 g/dL (ref 3.8–4.8)
Alkaline Phosphatase: 83 IU/L (ref 39–117)
BUN/Creatinine Ratio: 13 (ref 12–28)
BUN: 9 mg/dL (ref 8–27)
Bilirubin Total: 0.5 mg/dL (ref 0.0–1.2)
CO2: 24 mmol/L (ref 20–29)
Calcium: 9.1 mg/dL (ref 8.7–10.3)
Chloride: 103 mmol/L (ref 96–106)
Creatinine, Ser: 0.7 mg/dL (ref 0.57–1.00)
GFR calc Af Amer: 107 mL/min/{1.73_m2} (ref >59)
GFR calc non Af Amer: 93 mL/min/{1.73_m2} (ref >59)
Globulin, Total: 2.2 g/dL (ref 1.5–4.5)
Glucose: 88 mg/dL (ref 65–99)
Potassium: 3.8 mmol/L (ref 3.5–5.2)
Sodium: 142 mmol/L (ref 134–144)
Total Protein: 6 g/dL (ref 6.0–8.5)

## 2019-11-17 LAB — THYROID PANEL WITH TSH
Free Thyroxine Index: 2.5 (ref 1.2–4.9)
T3 Uptake Ratio: 27 % (ref 24–39)
T4, Total: 9.4 ug/dL (ref 4.5–12.0)
TSH: 3.27 u[IU]/mL (ref 0.450–4.500)

## 2019-11-17 NOTE — Telephone Encounter (Signed)
Reviewed results with pt .

## 2019-11-22 ENCOUNTER — Ambulatory Visit: Payer: PPO | Admitting: Physician Assistant

## 2019-11-22 ENCOUNTER — Encounter: Payer: Self-pay | Admitting: Physician Assistant

## 2019-11-22 ENCOUNTER — Ambulatory Visit (INDEPENDENT_AMBULATORY_CARE_PROVIDER_SITE_OTHER): Payer: PPO | Admitting: Physician Assistant

## 2019-11-22 DIAGNOSIS — J329 Chronic sinusitis, unspecified: Secondary | ICD-10-CM | POA: Diagnosis not present

## 2019-11-22 DIAGNOSIS — E039 Hypothyroidism, unspecified: Secondary | ICD-10-CM | POA: Diagnosis not present

## 2019-11-22 DIAGNOSIS — I10 Essential (primary) hypertension: Secondary | ICD-10-CM

## 2019-11-22 DIAGNOSIS — J40 Bronchitis, not specified as acute or chronic: Secondary | ICD-10-CM

## 2019-11-22 DIAGNOSIS — J0110 Acute frontal sinusitis, unspecified: Secondary | ICD-10-CM | POA: Diagnosis not present

## 2019-11-22 MED ORDER — PREDNISONE 10 MG (21) PO TBPK: ORAL_TABLET | ORAL | 0 refills | Status: DC

## 2019-11-22 MED ORDER — LEVOTHYROXINE SODIUM 137 MCG PO TABS: 137.0000 ug | ORAL_TABLET | Freq: Every day | ORAL | 3 refills | Status: DC

## 2019-11-22 MED ORDER — ALPRAZOLAM 0.5 MG PO TABS: 0.5000 mg | ORAL_TABLET | Freq: Two times a day (BID) | ORAL | 0 refills | Status: DC | PRN

## 2019-11-22 MED ORDER — CETIRIZINE HCL 10 MG PO TABS: 10.0000 mg | ORAL_TABLET | Freq: Every day | ORAL | 3 refills | Status: AC

## 2019-11-22 MED ORDER — CEFDINIR 300 MG PO CAPS: 300.0000 mg | ORAL_CAPSULE | Freq: Two times a day (BID) | ORAL | 1 refills | Status: DC

## 2019-11-22 MED ORDER — OMEPRAZOLE 20 MG PO CPDR: 20.0000 mg | DELAYED_RELEASE_CAPSULE | Freq: Every day | ORAL | 3 refills | Status: AC

## 2019-11-22 MED ORDER — LOSARTAN POTASSIUM 50 MG PO TABS: 50.0000 mg | ORAL_TABLET | Freq: Every day | ORAL | 3 refills | Status: DC

## 2019-11-22 MED ORDER — MONTELUKAST SODIUM 10 MG PO TABS: 10.0000 mg | ORAL_TABLET | Freq: Every evening | ORAL | 3 refills | Status: DC

## 2019-11-22 NOTE — Progress Notes (Deleted)
Alicia Russo   Acute Office Visit  Subjective:    Patient ID: Alicia Russo, female    DOB: 09-May-1956, 63 y.o.   MRN: [id number]     Telephone visit  Subjective: RW:ERXVQ and chronic conditions PCP: Alicia Sleeper, PA-C MGQ:QPYP Alicia Russo is a 63 y.o. female calls for telephone consult today. Patient provides verbal consent for consult held via phone.  Patient is identified with 2 separate identifiers.  At this time the entire area is on COVID-19 social distancing and stay home orders are in place.  Patient is of higher risk and therefore we are performing this by a virtual method.  Location of patient: home Location of provider: HOME Others present for call: no  Hypertension This is a chronic problem. The current episode started more than 1 year ago. The problem is unchanged. The problem is controlled. Pertinent negatives include no chest pain or palpitations. There are no associated agents to hypertension. Risk factors for coronary artery disease include dyslipidemia and family history. The current treatment provides significant improvement. There are no compliance problems.  Identifiable causes of hypertension include a thyroid problem.  Thyroid Problem Presents for follow-up visit. Symptoms include cold intolerance and fatigue. Patient reports no constipation, depressed mood, hoarse voice, leg swelling, palpitations or visual change. The symptoms have been stable.   This patient has had many days of sinus headache and postnasal drainage. There is copious drainage at times. Denies any fever at this time. There has been a history of sinus infections in the past.  No history of sinus surgery. There is cough at night. It has become more prevalent in recent days.   Past Medical History:  Diagnosis Date  . Anxiety   . Asthma   . AVM (arteriovenous malformation)    Intracranial with seizures  . Closed fracture of left distal radius   . Fibromyalgia   . GERD (gastroesophageal reflux  disease)   . Hyperlipidemia   . Hypothyroidism   . Seizures ([place])    AVM repair    Past Surgical History:  Procedure Laterality Date  . CHOLECYSTECTOMY    . CRANIOTOMY     for AVM  . ORIF WRIST FRACTURE Left 02/16/2017   Procedure: OPEN REDUCTION INTERNAL FIXATION (ORIF) LEFT WRIST FRACTURE;  Surgeon: Renette Butters, MD;  Location: [facility];  Service: Orthopedics;  Laterality: Left;    Family History  Problem Relation Age of Onset  . Emphysema Father        died at age 28  . COPD Father   . Hypertension Mother     Social History   Socioeconomic History  . Marital status: Single    Spouse name: Not on file  . Number of children: Not on file  . Years of education: Not on file  . Highest education level: Not on file  Occupational History  . Not on file  Social Needs  . Financial resource strain: Not on file  . Food insecurity    Worry: Not on file    Inability: Not on file  . Transportation needs    Medical: Not on file    Non-medical: Not on file  Tobacco Use  . Smoking status: Former Smoker    Years: 20.00    Quit date: 04/30/1999    Years since quitting: 20.5  . Smokeless tobacco: Never Used  Substance and Sexual Activity  . Alcohol use: No    Alcohol/week: 0.0 standard drinks  . Drug use: No  .  Sexual activity: Never  Lifestyle  . Physical activity    Days per week: Not on file    Minutes per session: Not on file  . Stress: Not on file  Relationships  . Social Herbalist on phone: Not on file    Gets together: Not on file    Attends religious service: Not on file    Active member of club or organization: Not on file    Attends meetings of clubs or organizations: Not on file    Relationship status: Not on file  . Intimate partner violence    Fear of current or ex partner: Not on file    Emotionally abused: Not on file    Physically abused: Not on file    Forced sexual activity: Not on file  Other Topics Concern  . Not  on file  Social History Narrative  . Not on file    Outpatient Medications Prior to Visit  Medication Sig Dispense Refill  . albuterol (PROVENTIL) (2.5 MG/3ML) 0.083% nebulizer solution USE ONE vial in nebulizer EVERY 6 HOURS AS NEEDED wheezing SHORTNESS OF BREATH 240 mL 5  . albuterol (VENTOLIN HFA) 108 (90 Base) MCG/ACT inhaler INHALE 2 PUFFS INTO THE LUNGS EVERY 6 HOURS AS NEEDED FOR WHEEZING OR SHORTNESS OF BREATH. 18 g 5  . aspirin 81 MG EC tablet Take 81 mg by mouth 2 (two) times daily.     . Biotin 10 MG TABS Take 1 tablet by mouth 2 (two) times daily.     . Cholecalciferol (VITAMIN D3) 2000 UNITS TABS Take 1 tablet by mouth 2 (two) times daily.    . Cyanocobalamin (VITAMIN B-12 PO) Take 1 tablet by mouth 2 (two) times daily.    . fluticasone furoate-vilanterol (BREO ELLIPTA) 100-25 MCG/INH AEPB Inhale 1 puff into the lungs daily. 60 each 1  . ibuprofen (ADVIL,MOTRIN) 800 MG tablet TAKE ONE TABLET BY MOUTH THREE TIMES DAILY (Patient taking differently: TAKE ONE TABLET BY MOUTH THREE TIMES DAILY prn) 90 tablet 2  . magnesium 30 MG tablet Take 30 mg by mouth 2 (two) times daily.    . methocarbamol (ROBAXIN) 500 MG tablet TAKE 1 TABLET BY MOUTH EVERY 6 HOURS AS NEEDED FOR MUSCLE SPASMS 120 tablet 2  . Milnacipran HCl (SAVELLA) 100 MG TABS tablet Take 1 tablet (100 mg total) by mouth 2 (two) times daily. 60 tablet 11  . Multiple Vitamin (MULTIVITAMIN) tablet Take 1 tablet by mouth daily.    . SUPER B COMPLEX/C PO Take 1 tablet by mouth 2 (two) times daily.    Marland Kitchen amoxicillin-clavulanate (AUGMENTIN) 875-125 MG tablet Take 1 tablet by mouth 2 (two) times daily. 20 tablet 0  . cetirizine (ZYRTEC) 10 MG tablet Take 1 tablet (10 mg total) by mouth daily. 90 tablet 3  . levothyroxine (SYNTHROID) 137 MCG tablet Take 1 tablet (137 mcg total) by mouth daily. (Needs to be seen before next refill) 30 tablet 0  . losartan (COZAAR) 50 MG tablet Take 1 tablet (50 mg total) by mouth daily. (Needs to be seen  before next refill) 30 tablet 0  . montelukast (SINGULAIR) 10 MG tablet Take 1 tablet (10 mg total) by mouth every evening. 90 tablet 3  . omeprazole (PRILOSEC) 20 MG capsule Take 1 capsule (20 mg total) by mouth daily. (Needs to be seen before next refill) 30 capsule 0  . predniSONE (STERAPRED UNI-PAK 21 TAB) 10 MG (21) TBPK tablet As directed x 6 days 21 tablet  0   No facility-administered medications prior to visit.     Allergies  Allergen Reactions  . Bee Venom     Cant breath, swell up  . Cherry Flavor Hives      dizziness  . Citalopram Hydrobromide     Mood swings, gets "really really mean"  . Statins     Myalgia and muscle pain  . Tramadol     unknown    Review of Systems  Constitutional: Positive for fatigue. Negative for chills and fever.  HENT: Positive for congestion, ear pain, sinus pain and sore throat. Negative for hoarse voice and nosebleeds.   Respiratory: Positive for cough.   Cardiovascular: Negative.  Negative for chest pain and palpitations.  Gastrointestinal: Negative.  Negative for constipation.  Skin: Negative.   Neurological: Negative.   Endo/Heme/Allergies: Positive for cold intolerance.       Objective:      There were no vitals taken for this visit. Wt Readings from Last 3 Encounters:  02/24/19 195 lb (88.5 kg)  12/20/18 193 lb (87.5 kg)  12/05/18 194 lb (88 kg)    Health Maintenance Due  Topic Date Due  . HIV Screening  09/14/1971  . TETANUS/TDAP  09/14/1975  . COLONOSCOPY  09/13/2006  . DEXA SCAN  05/12/2019  . INFLUENZA VACCINE  07/15/2019  . MAMMOGRAM  10/08/2019    There are no preventive care reminders to display for this patient.   Lab Results  Component Value Date   TSH 3.270 11/16/2019   Lab Results  Component Value Date   WBC 7.9 11/16/2019   HGB 15.0 11/16/2019   HCT 44.5 11/16/2019   MCV 89 11/16/2019   PLT 293 11/16/2019   Lab Results  Component Value Date   NA 142 11/16/2019   K 3.8 11/16/2019   CO2 24  11/16/2019   GLUCOSE 88 11/16/2019   BUN 9 11/16/2019   CREATININE 0.70 11/16/2019   BILITOT 0.5 11/16/2019   ALKPHOS 83 11/16/2019   AST 11 11/16/2019   ALT 12 11/16/2019   PROT 6.0 11/16/2019   ALBUMIN 3.8 11/16/2019   CALCIUM 9.1 11/16/2019   ANIONGAP 11 01/06/2015   Lab Results  Component Value Date   CHOL 205 (H) 11/16/2019   Lab Results  Component Value Date   HDL 53 11/16/2019   Lab Results  Component Value Date   LDLCALC 122 (H) 11/16/2019   Lab Results  Component Value Date   TRIG 170 (H) 11/16/2019   Lab Results  Component Value Date   CHOLHDL 3.9 11/16/2019   No results found for: HGBA1C     Assessment & Plan:   Problem List Items Addressed This Visit      Cardiovascular and Mediastinum   Essential hypertension   Relevant Medications   losartan (COZAAR) 50 MG tablet     Endocrine   Hypothyroidism   Relevant Medications   levothyroxine (SYNTHROID) 137 MCG tablet    Other Visit Diagnoses    Subacute frontal sinusitis       Relevant Medications   cefdinir (OMNICEF) 300 MG capsule   predniSONE (STERAPRED UNI-PAK 21 TAB) 10 MG (21) TBPK tablet   cetirizine (ZYRTEC) 10 MG tablet   Sinobronchitis       Relevant Medications   cefdinir (OMNICEF) 300 MG capsule   predniSONE (STERAPRED UNI-PAK 21 TAB) 10 MG (21) TBPK tablet   cetirizine (ZYRTEC) 10 MG tablet       Meds ordered this encounter  Medications  .  ALPRAZolam (XANAX) 0.5 MG tablet    Sig: Take 1 tablet (0.5 mg total) by mouth 2 (two) times daily as needed for anxiety.    Dispense:  40 tablet    Refill:  0    Order Specific Question:   Supervising Provider    Answer:   Raliegh Ip [[id number]]  . cefdinir (OMNICEF) 300 MG capsule    Sig: Take 1 capsule (300 mg total) by mouth 2 (two) times daily. 1 po BID    Dispense:  20 capsule    Refill:  1    Order Specific Question:   Supervising Provider    Answer:   Raliegh Ip [[id number]]  . predniSONE (STERAPRED UNI-PAK 21 TAB)  10 MG (21) TBPK tablet    Sig: As directed x 6 days    Dispense:  21 tablet    Refill:  0    Order Specific Question:   Supervising Provider    Answer:   Raliegh Ip [[id number]]  . levothyroxine (SYNTHROID) 137 MCG tablet    Sig: Take 1 tablet (137 mcg total) by mouth daily.    Dispense:  90 tablet    Refill:  3    Order Specific Question:   Supervising Provider    Answer:   Raliegh Ip [[id number]]  . losartan (COZAAR) 50 MG tablet    Sig: Take 1 tablet (50 mg total) by mouth daily.    Dispense:  90 tablet    Refill:  3    Order Specific Question:   Supervising Provider    Answer:   Raliegh Ip [[id number]]  . montelukast (SINGULAIR) 10 MG tablet    Sig: Take 1 tablet (10 mg total) by mouth every evening.    Dispense:  90 tablet    Refill:  3    Order Specific Question:   Supervising Provider    Answer:   Raliegh Ip [[id number]]  . omeprazole (PRILOSEC) 20 MG capsule    Sig: Take 1 capsule (20 mg total) by mouth daily.    Dispense:  90 capsule    Refill:  3    Order Specific Question:   Supervising Provider    Answer:   Raliegh Ip [[id number]]  . cetirizine (ZYRTEC) 10 MG tablet    Sig: Take 1 tablet (10 mg total) by mouth daily.    Dispense:  90 tablet    Refill:  3    Order Specific Question:   Supervising Provider    Answer:   Raliegh Ip [[id number]]     Start time: 3:08 PM End time 3:19 PM  Remus Loffler, PA-C

## 2019-11-26 NOTE — Progress Notes (Signed)
[facility]   Acute Office Visit  Subjective:    Patient ID: Alicia Russo, female    DOB: 1956-02-25, 63 y.o.   MRN: [id number]    Telephone visit  Subjective: Alicia Russo and chronic conditions PCP: Remus Loffler, PA-C Alicia Russo is a 63 y.o. female calls for telephone consult today. Patient provides verbal consent for consult held via phone.  Patient is identified with 2 separate identifiers.  At this time the entire area is on COVID-19 social distancing and stay home orders are in place.  Patient is of higher risk and therefore we are performing this by a virtual method.  Location of patient: home Location of provider: HOME Others present for call: no  Hypertension This is a chronic problem. The current episode started more than 1 year ago. The problem is unchanged. The problem is controlled. Pertinent negatives include no chest pain or palpitations. There are no associated agents to hypertension. Risk factors for coronary artery disease include dyslipidemia and family history. The current treatment provides significant improvement. There are no compliance problems.  Identifiable causes of hypertension include a thyroid problem.  Thyroid Problem Presents for follow-up visit. Symptoms include cold intolerance and fatigue. Patient reports no constipation, depressed mood, hoarse voice, leg swelling, palpitations or visual change. The symptoms have been stable.   This patient has had many days of sinus headache and postnasal drainage. There is copious drainage at times. Denies any fever at this time. There has been a history of sinus infections in the past.  No history of sinus surgery. There is cough at night. It has become more prevalent in recent days.   Past Medical History:  Diagnosis Date  . Anxiety   . Asthma   . AVM (arteriovenous malformation)    Intracranial with seizures  . Closed fracture of left distal radius   . Fibromyalgia   . GERD (gastroesophageal reflux  disease)   . Hyperlipidemia   . Hypothyroidism   . Seizures (HCC)    AVM repair    Past Surgical History:  Procedure Laterality Date  . CHOLECYSTECTOMY    . CRANIOTOMY     for AVM  . ORIF WRIST FRACTURE Left 02/16/2017   Procedure: OPEN REDUCTION INTERNAL FIXATION (ORIF) LEFT WRIST FRACTURE;  Surgeon: Sheral Apley, MD;  Location: [facility];  Service: Orthopedics;  Laterality: Left;    Family History  Problem Relation Age of Onset  . Emphysema Father        died at age 68  . COPD Father   . Hypertension Mother     Social History   Socioeconomic History  . Marital status: Single    Spouse name: Not on file  . Number of children: Not on file  . Years of education: Not on file  . Highest education level: Not on file  Occupational History  . Not on file  Social Needs  . Financial resource strain: Not on file  . Food insecurity    Worry: Not on file    Inability: Not on file  . Transportation needs    Medical: Not on file    Non-medical: Not on file  Tobacco Use  . Smoking status: Former Smoker    Years: 20.00    Quit date: 04/30/1999    Years since quitting: 20.5  . Smokeless tobacco: Never Used  Substance and Sexual Activity  . Alcohol use: No    Alcohol/week: 0.0 standard drinks  . Drug use: No  .  Sexual activity: Never  Lifestyle  . Physical activity    Days per week: Not on file    Minutes per session: Not on file  . Stress: Not on file  Relationships  . Social Herbalist on phone: Not on file    Gets together: Not on file    Attends religious service: Not on file    Active member of club or organization: Not on file    Attends meetings of clubs or organizations: Not on file    Relationship status: Not on file  . Intimate partner violence    Fear of current or ex partner: Not on file    Emotionally abused: Not on file    Physically abused: Not on file    Forced sexual activity: Not on file  Other Topics Concern  . Not  on file  Social History Narrative  . Not on file    Outpatient Medications Prior to Visit  Medication Sig Dispense Refill  . albuterol (PROVENTIL) (2.5 MG/3ML) 0.083% nebulizer solution USE ONE vial in nebulizer EVERY 6 HOURS AS NEEDED wheezing SHORTNESS OF BREATH 240 mL 5  . albuterol (VENTOLIN HFA) 108 (90 Base) MCG/ACT inhaler INHALE 2 PUFFS INTO THE LUNGS EVERY 6 HOURS AS NEEDED FOR WHEEZING OR SHORTNESS OF BREATH. 18 g 5  . aspirin 81 MG EC tablet Take 81 mg by mouth 2 (two) times daily.     . Biotin 10 MG TABS Take 1 tablet by mouth 2 (two) times daily.     . Cholecalciferol (VITAMIN D3) 2000 UNITS TABS Take 1 tablet by mouth 2 (two) times daily.    . Cyanocobalamin (VITAMIN B-12 PO) Take 1 tablet by mouth 2 (two) times daily.    . fluticasone furoate-vilanterol (BREO ELLIPTA) 100-25 MCG/INH AEPB Inhale 1 puff into the lungs daily. 60 each 1  . ibuprofen (ADVIL,MOTRIN) 800 MG tablet TAKE ONE TABLET BY MOUTH THREE TIMES DAILY (Patient taking differently: TAKE ONE TABLET BY MOUTH THREE TIMES DAILY prn) 90 tablet 2  . magnesium 30 MG tablet Take 30 mg by mouth 2 (two) times daily.    . methocarbamol (ROBAXIN) 500 MG tablet TAKE 1 TABLET BY MOUTH EVERY 6 HOURS AS NEEDED FOR MUSCLE SPASMS 120 tablet 2  . Milnacipran HCl (SAVELLA) 100 MG TABS tablet Take 1 tablet (100 mg total) by mouth 2 (two) times daily. 60 tablet 11  . Multiple Vitamin (MULTIVITAMIN) tablet Take 1 tablet by mouth daily.    . SUPER B COMPLEX/C PO Take 1 tablet by mouth 2 (two) times daily.    Marland Kitchen amoxicillin-clavulanate (AUGMENTIN) 875-125 MG tablet Take 1 tablet by mouth 2 (two) times daily. 20 tablet 0  . cetirizine (ZYRTEC) 10 MG tablet Take 1 tablet (10 mg total) by mouth daily. 90 tablet 3  . levothyroxine (SYNTHROID) 137 MCG tablet Take 1 tablet (137 mcg total) by mouth daily. (Needs to be seen before next refill) 30 tablet 0  . losartan (COZAAR) 50 MG tablet Take 1 tablet (50 mg total) by mouth daily. (Needs to be seen  before next refill) 30 tablet 0  . montelukast (SINGULAIR) 10 MG tablet Take 1 tablet (10 mg total) by mouth every evening. 90 tablet 3  . omeprazole (PRILOSEC) 20 MG capsule Take 1 capsule (20 mg total) by mouth daily. (Needs to be seen before next refill) 30 capsule 0  . predniSONE (STERAPRED UNI-PAK 21 TAB) 10 MG (21) TBPK tablet As directed x 6 days 21 tablet  0   No facility-administered medications prior to visit.     Allergies  Allergen Reactions  . Bee Venom     Cant breath, swell up  . Cherry Flavor Hives      dizziness  . Citalopram Hydrobromide     Mood swings, gets "really really mean"  . Statins     Myalgia and muscle pain  . Tramadol     unknown    Review of Systems  Constitutional: Positive for fatigue. Negative for chills and fever.  HENT: Positive for congestion, ear pain, sinus pain and sore throat. Negative for hoarse voice and nosebleeds.   Respiratory: Positive for cough.   Cardiovascular: Negative.  Negative for chest pain and palpitations.  Gastrointestinal: Negative.  Negative for constipation.  Skin: Negative.   Neurological: Negative.   Endo/Heme/Allergies: Positive for cold intolerance.       Objective:      There were no vitals taken for this visit. Wt Readings from Last 3 Encounters:  02/24/19 195 lb (88.5 kg)  12/20/18 193 lb (87.5 kg)  12/05/18 194 lb (88 kg)    Health Maintenance Due  Topic Date Due  . HIV Screening  09/14/1971  . TETANUS/TDAP  09/14/1975  . COLONOSCOPY  09/13/2006  . DEXA SCAN  05/12/2019  . INFLUENZA VACCINE  07/15/2019  . MAMMOGRAM  10/08/2019    There are no preventive care reminders to display for this patient.   Lab Results  Component Value Date   TSH 3.270 11/16/2019   Lab Results  Component Value Date   WBC 7.9 11/16/2019   HGB 15.0 11/16/2019   HCT 44.5 11/16/2019   MCV 89 11/16/2019   PLT 293 11/16/2019   Lab Results  Component Value Date   NA 142 11/16/2019   K 3.8 11/16/2019   CO2 24  11/16/2019   GLUCOSE 88 11/16/2019   BUN 9 11/16/2019   CREATININE 0.70 11/16/2019   BILITOT 0.5 11/16/2019   ALKPHOS 83 11/16/2019   AST 11 11/16/2019   ALT 12 11/16/2019   PROT 6.0 11/16/2019   ALBUMIN 3.8 11/16/2019   CALCIUM 9.1 11/16/2019   ANIONGAP 11 01/06/2015   Lab Results  Component Value Date   CHOL 205 (H) 11/16/2019   Lab Results  Component Value Date   HDL 53 11/16/2019   Lab Results  Component Value Date   LDLCALC 122 (H) 11/16/2019   Lab Results  Component Value Date   TRIG 170 (H) 11/16/2019   Lab Results  Component Value Date   CHOLHDL 3.9 11/16/2019   No results found for: HGBA1C     Assessment & Plan:   Problem List Items Addressed This Visit      Cardiovascular and Mediastinum   Essential hypertension   Relevant Medications   losartan (COZAAR) 50 MG tablet     Endocrine   Hypothyroidism   Relevant Medications   levothyroxine (SYNTHROID) 137 MCG tablet    Other Visit Diagnoses    Subacute frontal sinusitis       Relevant Medications   cefdinir (OMNICEF) 300 MG capsule   predniSONE (STERAPRED UNI-PAK 21 TAB) 10 MG (21) TBPK tablet   cetirizine (ZYRTEC) 10 MG tablet   Sinobronchitis       Relevant Medications   cefdinir (OMNICEF) 300 MG capsule   predniSONE (STERAPRED UNI-PAK 21 TAB) 10 MG (21) TBPK tablet   cetirizine (ZYRTEC) 10 MG tablet       Meds ordered this encounter  Medications  .  ALPRAZolam (XANAX) 0.5 MG tablet    Sig: Take 1 tablet (0.5 mg total) by mouth 2 (two) times daily as needed for anxiety.    Dispense:  40 tablet    Refill:  0    Order Specific Question:   Supervising Provider    Answer:   Raliegh Ip [[id number]]  . cefdinir (OMNICEF) 300 MG capsule    Sig: Take 1 capsule (300 mg total) by mouth 2 (two) times daily. 1 po BID    Dispense:  20 capsule    Refill:  1    Order Specific Question:   Supervising Provider    Answer:   Raliegh Ip [[id number]]  . predniSONE (STERAPRED UNI-PAK 21 TAB)  10 MG (21) TBPK tablet    Sig: As directed x 6 days    Dispense:  21 tablet    Refill:  0    Order Specific Question:   Supervising Provider    Answer:   Raliegh Ip [[id number]]  . levothyroxine (SYNTHROID) 137 MCG tablet    Sig: Take 1 tablet (137 mcg total) by mouth daily.    Dispense:  90 tablet    Refill:  3    Order Specific Question:   Supervising Provider    Answer:   Raliegh Ip [[id number]]  . losartan (COZAAR) 50 MG tablet    Sig: Take 1 tablet (50 mg total) by mouth daily.    Dispense:  90 tablet    Refill:  3    Order Specific Question:   Supervising Provider    Answer:   Raliegh Ip [[id number]]  . montelukast (SINGULAIR) 10 MG tablet    Sig: Take 1 tablet (10 mg total) by mouth every evening.    Dispense:  90 tablet    Refill:  3    Order Specific Question:   Supervising Provider    Answer:   Raliegh Ip [[id number]]  . omeprazole (PRILOSEC) 20 MG capsule    Sig: Take 1 capsule (20 mg total) by mouth daily.    Dispense:  90 capsule    Refill:  3    Order Specific Question:   Supervising Provider    Answer:   Raliegh Ip [[id number]]  . cetirizine (ZYRTEC) 10 MG tablet    Sig: Take 1 tablet (10 mg total) by mouth daily.    Dispense:  90 tablet    Refill:  3    Order Specific Question:   Supervising Provider    Answer:   Raliegh Ip [[id number]]     Start time: 3:08 PM End time 3:19 PM  Remus Loffler, PA-C   This note is not being shared with the patient for the following reason: To respect privacy (The patient or proxy has requested that the information not be shared).

## 2020-01-02 DIAGNOSIS — Z0289 Encounter for other administrative examinations: Secondary | ICD-10-CM

## 2020-01-11 ENCOUNTER — Encounter: Payer: Self-pay | Admitting: Physician Assistant

## 2020-02-29 ENCOUNTER — Telehealth: Payer: Self-pay | Admitting: Physician Assistant

## 2020-02-29 NOTE — Chronic Care Management (AMB) (Signed)
  Chronic Care Management   Note  02/29/2020 Name: ANIYIAH ZELL MRN: [id number] DOB: [birth year]  LILLYMAE DUET is a 64 y.o. year old female who is a primary care patient of Terald Sleeper, PA-C. I reached out to [name] by phone today in response to a referral sent by Ms. [employer] health plan.     Ms. Smeal was given information about Chronic Care Management services today including:  1. CCM service includes personalized support from designated clinical staff supervised by her physician, including individualized plan of care and coordination with other care providers 2. 24/7 contact phone numbers for assistance for urgent and routine care needs. 3. Service will only be billed when office clinical staff spend 20 minutes or more in a month to coordinate care. 4. Only one practitioner may furnish and bill the service in a calendar month. 5. The patient may stop CCM services at any time (effective at the end of the month) by phone call to the office staff. 6. The patient will be responsible for cost sharing (co-pay) of up to 20% of the service fee (after annual deductible is met).  Patient agreed to services and verbal consent obtained.   Follow up plan: Telephone appointment with care management team member scheduled for: 07/16/2020.  [place], [place] [address] Direct Dial: [phone] [email or web address]

## 2020-03-18 ENCOUNTER — Ambulatory Visit (INDEPENDENT_AMBULATORY_CARE_PROVIDER_SITE_OTHER): Payer: PPO | Admitting: Family Medicine

## 2020-03-18 ENCOUNTER — Other Ambulatory Visit: Payer: Self-pay

## 2020-03-18 ENCOUNTER — Encounter: Payer: Self-pay | Admitting: Family Medicine

## 2020-03-18 VITALS — BP 134/72 | HR 61 | Temp 97.8°F | Ht 61.0 in | Wt 201.0 lb

## 2020-03-18 DIAGNOSIS — E039 Hypothyroidism, unspecified: Secondary | ICD-10-CM | POA: Diagnosis not present

## 2020-03-18 DIAGNOSIS — F410 Panic disorder [episodic paroxysmal anxiety] without agoraphobia: Secondary | ICD-10-CM | POA: Diagnosis not present

## 2020-03-18 DIAGNOSIS — I10 Essential (primary) hypertension: Secondary | ICD-10-CM | POA: Diagnosis not present

## 2020-03-18 DIAGNOSIS — F411 Generalized anxiety disorder: Secondary | ICD-10-CM | POA: Diagnosis not present

## 2020-03-18 DIAGNOSIS — Z79899 Other long term (current) drug therapy: Secondary | ICD-10-CM

## 2020-03-18 DIAGNOSIS — E7800 Pure hypercholesterolemia, unspecified: Secondary | ICD-10-CM | POA: Diagnosis not present

## 2020-03-18 MED ORDER — ALPRAZOLAM 0.5 MG PO TABS: 0.2500 mg | ORAL_TABLET | Freq: Two times a day (BID) | ORAL | 0 refills | Status: DC | PRN

## 2020-03-18 NOTE — Patient Instructions (Addendum)
Come in for fasting labs in June.  Will recheck your cholesterol and thyroid then.  See me back in 6 month unless you need me sooner  You are due for : Colon cancer screen- schedule this Bone density test Efraim Kaufmann will call you, might be able to do when you come for labs) Mammogram- schedule this.

## 2020-03-18 NOTE — Progress Notes (Signed)
Subjective: CC: establish care, generalized anxiety disorder with panic, asthma, hypothyroidism PCP: Janora Norlander, DO KKX:FGHW S Wellnitz is a 64 y.o. female presenting to clinic today for:  1.  Hypothyroidism History: Acquired atrophy.  No history of radiation or surgery to the neck.  No known family history of thyroid disorder.  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  She reports compliance with Synthroid 137 mcg daily.  No heart palpitations, unplanned weight changes, tremors, change in bowel habits or voice.  2.  Anxiety disorder with panic Patient reports intermittent use of alprazolam 0.5 mg as needed anxiety related to migraine headaches/insomnia.  She uses this very sparingly and only usually about half a tablet at a time.  Uses less than 4-5 times per month.  Denies any excessive daytime sleepiness, memory changes, respiratory depression, falls.  No alcohol use.  3.  Asthma Patient reports allergy induced asthma.  She uses Singulair daily along with her Zyrtec.  She rarely has to use her albuterol, usually less than 1 time per month.  No history of hospitalizations for asthma.  No history of intubation.    ROS: Per HPI  Allergies  Allergen Reactions  . Bee Venom     Cant breath, swell up  . Cherry Flavor Hives      dizziness  . Citalopram Hydrobromide     Mood swings, gets "really really mean"  . Statins     Myalgia and muscle pain  . Tramadol     unknown   Past Medical History:  Diagnosis Date  . Anxiety   . Asthma   . AVM (arteriovenous malformation)    Intracranial with seizures  . Closed fracture of left distal radius   . Fibromyalgia   . GERD (gastroesophageal reflux disease)   . Hyperlipidemia   . Hypothyroidism   . Seizures ([place])    AVM repair    Current Outpatient Medications:  .  albuterol (PROVENTIL) (2.5 MG/3ML) 0.083% nebulizer solution, USE ONE vial in nebulizer EVERY 6 HOURS AS  NEEDED wheezing SHORTNESS OF BREATH, Disp: 240 mL, Rfl: 5 .  albuterol (VENTOLIN HFA) 108 (90 Base) MCG/ACT inhaler, INHALE 2 PUFFS INTO THE LUNGS EVERY 6 HOURS AS NEEDED FOR WHEEZING OR SHORTNESS OF BREATH., Disp: 18 g, Rfl: 5 .  ALPRAZolam (XANAX) 0.5 MG tablet, Take 1 tablet (0.5 mg total) by mouth 2 (two) times daily as needed for anxiety., Disp: 40 tablet, Rfl: 0 .  aspirin 81 MG EC tablet, Take 81 mg by mouth 2 (two) times daily. , Disp: , Rfl:  .  Biotin 10 MG TABS, Take 1 tablet by mouth 2 (two) times daily. , Disp: , Rfl:  .  cetirizine (ZYRTEC) 10 MG tablet, Take 1 tablet (10 mg total) by mouth daily., Disp: 90 tablet, Rfl: 3 .  Cholecalciferol (VITAMIN D3) 2000 UNITS TABS, Take 1 tablet by mouth 2 (two) times daily., Disp: , Rfl:  .  Cyanocobalamin (VITAMIN B-12 PO), Take 1 tablet by mouth 2 (two) times daily., Disp: , Rfl:  .  ibuprofen (ADVIL,MOTRIN) 800 MG tablet, TAKE ONE TABLET BY MOUTH THREE TIMES DAILY (Patient taking differently: TAKE ONE TABLET BY MOUTH THREE TIMES DAILY prn), Disp: 90 tablet, Rfl: 2 .  levothyroxine (SYNTHROID) 137 MCG tablet, Take 1 tablet (137 mcg total) by mouth daily., Disp: 90 tablet, Rfl: 3 .  losartan (COZAAR) 50 MG tablet, Take 1 tablet (50 mg total) by mouth daily., Disp:  90 tablet, Rfl: 3 .  magnesium 30 MG tablet, Take 30 mg by mouth 2 (two) times daily., Disp: , Rfl:  .  methocarbamol (ROBAXIN) 500 MG tablet, TAKE 1 TABLET BY MOUTH EVERY 6 HOURS AS NEEDED FOR MUSCLE SPASMS, Disp: 120 tablet, Rfl: 2 .  montelukast (SINGULAIR) 10 MG tablet, Take 1 tablet (10 mg total) by mouth every evening., Disp: 90 tablet, Rfl: 3 .  Multiple Vitamin (MULTIVITAMIN) tablet, Take 1 tablet by mouth daily., Disp: , Rfl:  .  omeprazole (PRILOSEC) 20 MG capsule, Take 1 capsule (20 mg total) by mouth daily., Disp: 90 capsule, Rfl: 3 Social History   Socioeconomic History  . Marital status: Single    Spouse name: Not on file  . Number of children: Not on file  . Years  of education: Not on file  . Highest education level: Not on file  Occupational History  . Not on file  Tobacco Use  . Smoking status: Former Smoker    Years: 20.00    Quit date: 04/30/1999    Years since quitting: 20.8  . Smokeless tobacco: Never Used  Substance and Sexual Activity  . Alcohol use: No    Alcohol/week: 0.0 standard drinks  . Drug use: No  . Sexual activity: Never  Other Topics Concern  . Not on file  Social History Narrative  . Not on file   Social Determinants of Health   Financial Resource Strain:   . Difficulty of Paying Living Expenses:   Food Insecurity:   . Worried About Programme researcher, broadcasting/film/video in the Last Year:   . Barista in the Last Year:   Transportation Needs:   . Freight forwarder (Medical):   Marland Kitchen Lack of Transportation (Non-Medical):   Physical Activity:   . Days of Exercise per Week:   . Minutes of Exercise per Session:   Stress:   . Feeling of Stress :   Social Connections:   . Frequency of Communication with Friends and Family:   . Frequency of Social Gatherings with Friends and Family:   . Attends Religious Services:   . Active Member of Clubs or Organizations:   . Attends Banker Meetings:   Marland Kitchen Marital Status:   Intimate Partner Violence:   . Fear of Current or Ex-Partner:   . Emotionally Abused:   Marland Kitchen Physically Abused:   . Sexually Abused:    Family History  Problem Relation Age of Onset  . Emphysema Father        died at age 47  . COPD Father   . Hypertension Mother     Objective: Office vital signs reviewed. BP 134/72   Pulse 61   Temp 97.8 F (36.6 C) (Temporal)   Ht 5\' 1"  (1.549 m)   Wt 201 lb (91.2 kg)   SpO2 96%   BMI 37.98 kg/m   Physical Examination:  General: Awake, alert, well nourished, No acute distress HEENT: Normal; no goiter.  No exophthalmos. Cardio: regular rate and rhythm, S1S2 heard, no murmurs appreciated Pulm: clear to auscultation bilaterally, no wheezes, rhonchi or rales;  normal work of breathing on room air Extremities: warm, well perfused, No edema, cyanosis or clubbing; +2 pulses bilaterally MSK: normal gait and station Skin: dry; intact; no rashes or lesions; normal temperature Neuro: No tremor Psych: Mood stable, speech normal, affect appropriate, pleasant and interactive Depression screen [facility] 2/9 03/18/2020 02/24/2019 12/20/2018  Decreased Interest 0 0 0  Down, Depressed, Hopeless 0 0  0  PHQ - 2 Score 0 0 0  Altered sleeping 0 - -  Tired, decreased energy 0 - -  Change in appetite 0 - -  Feeling bad or failure about yourself  0 - -  Trouble concentrating 0 - -  Moving slowly or fidgety/restless 0 - -  Suicidal thoughts 0 - -  PHQ-9 Score 0 - -   GAD 7 : Generalized Anxiety Score 03/18/2020  Nervous, Anxious, on Edge 1  Control/stop worrying 0  Worry too much - different things 0  Trouble relaxing 1  Restless 2  Easily annoyed or irritable 1  Afraid - awful might happen 0  Total GAD 7 Score 5  Anxiety Difficulty Not difficult at all    Assessment/ Plan: 64 y.o. female   1. Essential hypertension Well-controlled.  Continue current regimen  2. Hypothyroidism, unspecified type Asymptomatic.  Check thyroid panel - Thyroid Panel With TSH; Future  3. Pure hypercholesterolemia Will come in for fasting lipid panel - Lipid panel; Future  4. Generalized anxiety disorder with panic attacks UDS and CSC was updated today per office policy.  The national narcotic database was reviewed and there were no red flags.  Xanax has been refilled and placed on file for patient as she does not need refills yet. - ToxASSURE Select 13 (MW), Urine  5. Controlled substance agreement signed - ToxASSURE Select 13 (MW), Urine   Orders Placed This Encounter  Procedures  . ToxASSURE Select 13 (MW), Urine  . Lipid panel    Standing Status:   Future    Standing Expiration Date:   03/18/2021  . Thyroid Panel With TSH    Standing Status:   Future    Standing  Expiration Date:   03/18/2021   Meds ordered this encounter  Medications  . ALPRAZolam (XANAX) 0.5 MG tablet    Sig: Take 0.5-1 tablets (0.25-0.5 mg total) by mouth 2 (two) times daily as needed for anxiety.    Dispense:  40 tablet    Refill:  0    Put on file     Raliegh Ip, DO [facility] [phone]

## 2020-03-21 ENCOUNTER — Other Ambulatory Visit: Payer: PPO

## 2020-03-21 ENCOUNTER — Other Ambulatory Visit: Payer: Self-pay

## 2020-03-21 DIAGNOSIS — F411 Generalized anxiety disorder: Secondary | ICD-10-CM | POA: Diagnosis not present

## 2020-03-21 DIAGNOSIS — Z79899 Other long term (current) drug therapy: Secondary | ICD-10-CM | POA: Diagnosis not present

## 2020-03-21 DIAGNOSIS — F410 Panic disorder [episodic paroxysmal anxiety] without agoraphobia: Secondary | ICD-10-CM | POA: Diagnosis not present

## 2020-03-23 LAB — TOXASSURE SELECT 13 (MW), URINE

## 2020-03-26 ENCOUNTER — Ambulatory Visit (INDEPENDENT_AMBULATORY_CARE_PROVIDER_SITE_OTHER): Payer: PPO

## 2020-03-26 DIAGNOSIS — Z0000 Encounter for general adult medical examination without abnormal findings: Secondary | ICD-10-CM | POA: Diagnosis not present

## 2020-03-26 NOTE — Patient Instructions (Signed)
  Alicia Russo , Thank you for taking time to come for your Medicare Wellness Visit. I appreciate your ongoing commitment to your health goals. Please review the following plan we discussed and let me know if I can assist you in the future.   These are the goals we discussed: Goals    . DIET - EAT MORE FRUITS AND VEGETABLES    . Exercise 150 min/wk Moderate Activity       This is a list of the screening recommended for you and due dates:  Health Maintenance  Topic Date Due  . HIV Screening  Never done  . Tetanus Vaccine  Never done  . Colon Cancer Screening  Never done  . DEXA scan (bone density measurement)  05/12/2019  . Mammogram  10/08/2019  . Flu Shot  07/14/2020  . Pap Smear  10/11/2020  .  Hepatitis C: One time screening is recommended by Center for Disease Control  (CDC) for  adults born from [birth year] through 1965.   Completed

## 2020-03-26 NOTE — Progress Notes (Signed)
MEDICARE ANNUAL WELLNESS VISIT  03/26/2020  Telephone Visit Disclaimer This Medicare AWV was conducted by telephone due to national recommendations for restrictions regarding the COVID-19 Pandemic (e.g. social distancing).  I verified, using two identifiers, that I am speaking with Alicia Russo or their authorized healthcare agent. I discussed the limitations, risks, security, and privacy concerns of performing an evaluation and management service by telephone and the potential availability of an in-person appointment in the future. The patient expressed understanding and agreed to proceed.   Subjective:  Alicia Russo is a 64 y.o. female patient of Raliegh Ip, DO who had a Medicare Annual Wellness Visit today via telephone. She lives in nearby [place] and is completely disabled due to her fibromyalgia. She is not married but enjoys the company of her family.. She is very close with her daughter and her mom and enjoys spending time with them and her grandchildren. She stays as active as possible. She used to work for [employer] and [employer].   Patient Care Team: Raliegh Ip, DO as PCP - General (Family Medicine) Gwenith Daily, RN as Registered Nurse  Advanced Directives 03/26/2020 04/29/2017 02/16/2017 02/15/2017 02/12/2017  Does Patient Have a Medical Advance Directive? Yes Yes Yes Yes No  Type of Estate agent of [employer] Power of [place];Living will Living will;Healthcare Power of Attorney Living will;Healthcare Power of Attorney -  Does patient want to make changes to medical advance directive? No - Patient declined No - Patient declined No - Patient declined - -  Copy of Healthcare Power of Attorney in Chart? No - copy requested No - copy requested No - copy requested [facility] Utilization Over the Past 12 Months: # of hospitalizations or ER visits: 0 # of surgeries: 0  Review of Systems    Patient reports that her overall health is  unchanged compared to last year.  History obtained from chart review  Patient Reported Readings (BP, Pulse, CBG, Weight, etc) none  Pain Assessment Pain : 0-10 Pain Score: 5  Pain Type: Chronic pain     Current Medications & Allergies (verified) Allergies as of 03/26/2020      Reactions   Bee Venom    Cant breath, swell up   Cherry Flavor Hives     dizziness   Citalopram Hydrobromide    Mood swings, gets "really really mean"   Statins    Myalgia and muscle pain   Tramadol    unknown      Medication List       Accurate as of March 26, 2020  3:08 PM. If you have any questions, ask your nurse or doctor.        albuterol (2.5 MG/3ML) 0.083% nebulizer solution Commonly known as: PROVENTIL USE ONE vial in nebulizer EVERY 6 HOURS AS NEEDED wheezing SHORTNESS OF BREATH   albuterol 108 (90 Base) MCG/ACT inhaler Commonly known as: Ventolin HFA INHALE 2 PUFFS INTO THE LUNGS EVERY 6 HOURS AS NEEDED FOR WHEEZING OR SHORTNESS OF BREATH.   ALPRAZolam 0.5 MG tablet Commonly known as: XANAX Take 0.5-1 tablets (0.25-0.5 mg total) by mouth 2 (two) times daily as needed for anxiety.   aspirin 81 MG EC tablet Take 81 mg by mouth 2 (two) times daily.   Biotin 10 MG Tabs Take 1 tablet by mouth 2 (two) times daily.   cetirizine 10 MG tablet Commonly known as: ZYRTEC Take 1 tablet (10 mg total) by mouth daily.  ibuprofen 800 MG tablet Commonly known as: ADVIL TAKE ONE TABLET BY MOUTH THREE TIMES DAILY What changed:   how much to take  how to take this  when to take this   levothyroxine 137 MCG tablet Commonly known as: SYNTHROID Take 1 tablet (137 mcg total) by mouth daily.   losartan 50 MG tablet Commonly known as: COZAAR Take 1 tablet (50 mg total) by mouth daily.   magnesium 30 MG tablet Take 30 mg by mouth 2 (two) times daily.   methocarbamol 500 MG tablet Commonly known as: ROBAXIN TAKE 1 TABLET BY MOUTH EVERY 6 HOURS AS NEEDED FOR MUSCLE SPASMS     montelukast 10 MG tablet Commonly known as: SINGULAIR Take 1 tablet (10 mg total) by mouth every evening.   multivitamin tablet Take 1 tablet by mouth daily.   omeprazole 20 MG capsule Commonly known as: PRILOSEC Take 1 capsule (20 mg total) by mouth daily.   VITAMIN B-12 PO Take 1 tablet by mouth 2 (two) times daily.   Vitamin D3 50 MCG (2000 UT) Tabs Take 1 tablet by mouth 2 (two) times daily.       History (reviewed): Past Medical History:  Diagnosis Date  . Anxiety   . Asthma   . AVM (arteriovenous malformation)    Intracranial with seizures  . Closed fracture of left distal radius   . Fibromyalgia   . GERD (gastroesophageal reflux disease)   . Hyperlipidemia   . Hypothyroidism   . Seizures (HCC)    AVM repair   Past Surgical History:  Procedure Laterality Date  . CHOLECYSTECTOMY    . CRANIOTOMY     for AVM  . ORIF WRIST FRACTURE Left 02/16/2017   Procedure: OPEN REDUCTION INTERNAL FIXATION (ORIF) LEFT WRIST FRACTURE;  Surgeon: Sheral Apley, MD;  Location: [facility];  Service: Orthopedics;  Laterality: Left;   Family History  Problem Relation Age of Onset  . Emphysema Father        died at age 50  . COPD Father   . Hypertension Mother    Social History   Socioeconomic History  . Marital status: Single    Spouse name: Not on file  . Number of children: 2  . Years of education: Not on file  . Highest education level: GED or equivalent  Occupational History  . Not on file  Tobacco Use  . Smoking status: Former Smoker    Years: 20.00    Quit date: 04/30/1999    Years since quitting: 20.9  . Smokeless tobacco: Never Used  Substance and Sexual Activity  . Alcohol use: No    Alcohol/week: 0.0 standard drinks  . Drug use: No  . Sexual activity: Not on file  Other Topics Concern  . Not on file  Social History Narrative  . Not on file   Social Determinants of Health   Financial Resource Strain:   . Difficulty of Paying Living  Expenses:   Food Insecurity:   . Worried About Programme researcher, broadcasting/film/video in the Last Year:   . Barista in the Last Year:   Transportation Needs:   . Freight forwarder (Medical):   Marland Kitchen Lack of Transportation (Non-Medical):   Physical Activity:   . Days of Exercise per Week:   . Minutes of Exercise per Session:   Stress:   . Feeling of Stress :   Social Connections:   . Frequency of Communication with Friends and Family:   .  Frequency of Social Gatherings with Friends and Family:   . Attends Religious Services:   . Active Member of Clubs or Organizations:   . Attends Banker Meetings:   Marland Kitchen Marital Status:     Activities of Daily Living In your present state of health, do you have any difficulty performing the following activities: 03/26/2020  Hearing? N  Vision? Y  Comment Wears glasses all the time  Difficulty concentrating or making decisions? N  Walking or climbing stairs? N  Dressing or bathing? N  Doing errands, shopping? N  Preparing Food and eating ? N  Using the Toilet? N  In the past six months, have you accidently leaked urine? N  Do you have problems with loss of bowel control? N  Managing your Medications? N  Managing your Finances? N  Housekeeping or managing your Housekeeping? N  Some recent data might be hidden    Patient Education/ Literacy How often do you need to have someone help you when you read instructions, pamphlets, or other written materials from your doctor or pharmacy?: 1 - Never What is the last grade level you completed in school?: GED  Exercise Current Exercise Habits: The patient does not participate in regular exercise at present, Exercise limited by: Other - see comments(Fibromyalgia)  Diet Patient reports consuming 3 meals a day and 2 snack(s) a day Patient reports that her primary diet is: Regular Patient reports that she does not have regular access to food.   Depression Screen PHQ 2/9 Scores 03/18/2020 02/24/2019  12/20/2018 12/05/2018 11/21/2018 11/01/2018 08/19/2018  PHQ - 2 Score 0 0 0 1 0 0 0  PHQ- 9 Score 0 - - - - - -     Fall Risk Fall Risk  03/26/2020 02/24/2019 12/20/2018 12/05/2018 11/01/2018  Falls in the past year? 0 0 0 0 0  Number falls in past yr: - - - - -  Injury with Fall? - - - - -     Objective:  Alicia Russo seemed alert and oriented and she participated appropriately during our telephone visit.  Blood Pressure Weight BMI  BP Readings from Last 3 Encounters:  03/18/20 134/72  02/24/19 (!) 146/91  12/20/18 137/81   Wt Readings from Last 3 Encounters:  03/18/20 201 lb (91.2 kg)  02/24/19 195 lb (88.5 kg)  12/20/18 193 lb (87.5 kg)   BMI Readings from Last 1 Encounters:  03/18/20 37.98 kg/m    *Unable to obtain current vital signs, weight, and BMI due to telephone visit type  Hearing/Vision  . Ellon did not seem to have difficulty with hearing/understanding during the telephone conversation . Reports that she has had a formal eye exam by an eye care professional within the past year . Reports that she has not had a formal hearing evaluation within the past year *Unable to fully assess hearing and vision during telephone visit type  Cognitive Function: 6CIT Screen 03/26/2020  What Year? 0 points  What month? 0 points  What time? 0 points  Count back from 20 0 points  Months in reverse 0 points  Repeat phrase 0 points  Total Score 0   (Normal:0-7, Significant for Dysfunction: >8)  Normal Cognitive Function Screening: Yes   Immunization & Health Maintenance Record Immunization History  Administered Date(s) Administered  . Influenza,inj,quad, With Preservative 09/01/2018  . Influenza-Unspecified 08/26/2016, 09/22/2017, 08/24/2018    Health Maintenance  Topic Date Due  . HIV Screening  Never done  . TETANUS/TDAP  Never done  .  COLONOSCOPY  Never done  . DEXA SCAN  05/12/2019  . MAMMOGRAM  10/08/2019  . INFLUENZA VACCINE  07/14/2020  . PAP SMEAR-Modifier   10/11/2020  . Hepatitis C Screening  Completed       Assessment  This is a routine wellness examination for Alicia Russo.  Health Maintenance: Due or Overdue Health Maintenance Due  Topic Date Due  . HIV Screening  Never done  . TETANUS/TDAP  Never done  . COLONOSCOPY  Never done  . DEXA SCAN  05/12/2019  . MAMMOGRAM  10/08/2019    Alicia Russo does not need a referral for Community Assistance: Care Management:   no Social Work:    no Prescription Assistance:  no Nutrition/Diabetes Education:  no   Plan:  Personalized Goals Goals Addressed            This Visit's Progress   . DIET - EAT MORE FRUITS AND VEGETABLES      . Exercise 150 min/wk Moderate Activity        Personalized Health Maintenance & Screening Recommendations  Td vaccine Screening mammography Bone densitometry screening  Lung Cancer Screening Recommended: no (Low Dose CT Chest recommended if Age 48-80 years, 30 pack-year currently smoking OR have quit w/in past 15 years) Hepatitis C Screening recommended: Completed on 10-05-2016 HIV Screening recommended: no  Advanced Directives: Written information was not prepared per patient's request.  Referrals & Orders No orders of the defined types were placed in this encounter.   Follow-up Plan . Follow-up with Janora Norlander, DO as planned . Patient needs Tetanus, Bone density, and also a mammogram   I have personally reviewed and noted the following in the patient's chart:   . Medical and social history . Use of alcohol, tobacco or illicit drugs  . Current medications and supplements . Functional ability and status . Nutritional status . Physical activity . Advanced directives . List of other physicians . Hospitalizations, surgeries, and ER visits in previous 12 months . Vitals . Screenings to include cognitive, depression, and falls . Referrals and appointments  In addition, I have reviewed and discussed with Alicia Russo  certain preventive protocols, quality metrics, and best practice recommendations. A written personalized care plan for preventive services as well as general preventive health recommendations is available and can be mailed to the patient at her request.      Rolena Infante      LPN  [id number]

## 2020-04-09 ENCOUNTER — Ambulatory Visit (INDEPENDENT_AMBULATORY_CARE_PROVIDER_SITE_OTHER): Payer: PPO | Admitting: Nurse Practitioner

## 2020-04-09 ENCOUNTER — Encounter: Payer: Self-pay | Admitting: Nurse Practitioner

## 2020-04-09 DIAGNOSIS — H9201 Otalgia, right ear: Secondary | ICD-10-CM | POA: Diagnosis not present

## 2020-04-09 MED ORDER — OFLOXACIN 0.3 % OT SOLN: 5.0000 [drp] | Freq: Every day | OTIC | 0 refills | Status: DC

## 2020-04-09 MED ORDER — AMOXICILLIN 875 MG PO TABS: 875.0000 mg | ORAL_TABLET | Freq: Two times a day (BID) | ORAL | 0 refills | Status: DC

## 2020-04-09 NOTE — Progress Notes (Signed)
Virtual Visit via telephone Note Due to COVID-19 pandemic this visit was conducted virtually. This visit type was conducted due to national recommendations for restrictions regarding the COVID-19 Pandemic (e.g. social distancing, sheltering in place) in an effort to limit this patient's exposure and mitigate transmission in our community. All issues noted in this document were discussed and addressed.  A physical exam was not performed with this format.  I connected with Alicia Russo on 04/09/20 at 10:00 by telephone and verified that I am speaking with the correct person using two identifiers. Alicia Russo is currently located at home and no one is currently with her during visit. The provider, Mary-Margaret Daphine Deutscher, FNP is located in their office at time of visit.  I discussed the limitations, risks, security and privacy concerns of performing an evaluation and management service by telephone and the availability of in person appointments. I also discussed with the patient that there may be a patient responsible charge related to this service. The patient expressed understanding and agreed to proceed.   History and Present Illness:   Chief Complaint: Otalgia   HPI Patient calls in c/o ear ache that started Saturday morning. Has gradually gotten worse. Has been using a heated wrap and that helps. wiggling hurts ear as well as has tragus tenderness. No drainage.   Review of Systems  HENT: Positive for ear pain. Negative for congestion and ear discharge.   Respiratory: Negative.   Cardiovascular: Negative.   Neurological: Negative.   Psychiatric/Behavioral: Negative.   All other systems reviewed and are negative.    Observations/Objective: Alert and oriented- answers all questions appropriately No distress Wiggling of ear causes pain   Assessment and Plan: Alicia Russo in today with chief complaint of Otalgia   1. Right ear pain Continue heat Motrin or tylenol OTC Do  not put anything in ear canal Meds ordered this encounter  Medications  . ofloxacin (FLOXIN OTIC) 0.3 % OTIC solution    Sig: Place 5 drops into both ears daily.    Dispense:  5 mL    Refill:  0    Order Specific Question:   Supervising Provider    Answer:   Arville Care A [id number]  . amoxicillin (AMOXIL) 875 MG tablet    Sig: Take 1 tablet (875 mg total) by mouth 2 (two) times daily. 1 po BID    Dispense:  14 tablet    Refill:  0    Order Specific Question:   Supervising Provider    Answer:   Arville Care A [[id number]]        Follow Up Instructions: prn    I discussed the assessment and treatment plan with the patient. The patient was provided an opportunity to ask questions and all were answered. The patient agreed with the plan and demonstrated an understanding of the instructions.   The patient was advised to call back or seek an in-person evaluation if the symptoms worsen or if the condition fails to improve as anticipated.  The above assessment and management plan was discussed with the patient. The patient verbalized understanding of and has agreed to the management plan. Patient is aware to call the clinic if symptoms persist or worsen. Patient is aware when to return to the clinic for a follow-up visit. Patient educated on when it is appropriate to go to the emergency department.   Time call ended:  10:10  I provided 10 minutes of non-face-to-face time during this encounter.  Mary-Margaret Tamia Dial, FNP   

## 2020-04-15 ENCOUNTER — Telehealth: Payer: PPO | Admitting: Family

## 2020-04-15 DIAGNOSIS — S1096XA Insect bite of unspecified part of neck, initial encounter: Secondary | ICD-10-CM

## 2020-04-15 DIAGNOSIS — W57XXXA Bitten or stung by nonvenomous insect and other nonvenomous arthropods, initial encounter: Secondary | ICD-10-CM | POA: Diagnosis not present

## 2020-04-15 MED ORDER — DOXYCYCLINE HYCLATE 100 MG PO TABS: 100.0000 mg | ORAL_TABLET | Freq: Two times a day (BID) | ORAL | 0 refills | Status: DC

## 2020-04-15 NOTE — Progress Notes (Signed)
Thank you for describing your tick bite, Here is how we plan to help! Based on the information that you shared with me it looks like you have A tick that bite that we will treat with a short course of doxycycline.  In most cases a tick bite is painless and does not itch.  Most tick bites in which the tick is quickly removed do not require prescriptions. Ticks can transmit several diseases if they are infected and remain attacked to your skin. Therefore the length that the tick was attached and any symptoms you have experienced after the bite are import to accurately develop your custom treatment plan. In most cases a single dose of doxycycline may prevent the development of a more serious condition.  Based on your information I have Provided a home care guide for tick bites and  instructions on when to call for help. and Your symptoms indicate that you need a longer course of antibiotics and a follow up visit with a provider. I have sent doxycycline 100 mg twice a day for 21 days to the pharmacy that you selected. You will need to schedule a follow up visit with your provider. If you do not have a primary care provider you may use our telehealth physicians on the web at [email or web address]  Which ticks  are associated with illness?  The Wood Tick (dog tick) is the size of a watermelon seed and can sometimes transmit Rocky Mountain spotted fever and [place] tick fever.   The Deer Tick (black-legged tick) is between the size of a poppy seed (pin head) and an apple seed, and can sometimes transmit Lyme disease.  A brown to black tick with a white splotch on its back is likely a female Amblyomma americanum ([place] tick). This tick has been associated with Southern Tick Associated illness ( STARI)  Lyme disease has become the most common tick-borne illness in the [place]. The risk of Lyme disease following a recognized deer tick bite is estimated to be 1%.  The majority of cases of Lyme disease  start with a bull's eye rash at the site of the tick bite. The rash can occur days to weeks (typically 7-10 days) after a tick bite. Treatment with antibiotics is indicated if this rash appears. Flu-like symptoms may accompany the rash, including: fever, chills, headaches, muscle aches, and fatigue. Removing ticks promptly may prevent tick borne disease.  What can be used to prevent Tick Bites?   Insect repellant with at leas 20% DEET.  Wearing long pants with sock and shoes.  Avoiding tall grass and heavily wooded areas.  Checking your skin after being outdoors.  Shower with a washcloth after outdoor exposures.  HOME CARE ADVICE FOR TICK BITE  1. Wood Tick Removal:  o Use a pair of tweezers and grasp the wood tick close to the skin (on its head). Pull the wood tick straight upward without twisting or crushing it. Maintain a steady pressure until it releases its grip.   o If tweezers aren't available, use fingers, a loop of thread around the jaws, or a needle between the jaws for traction.  o Note: covering the tick with petroleum jelly, nail polish or rubbing alcohol doesn't work. Neither does touching the tick with a hot or cold object. 2. Tiny Deer Tick Removal:   o Needs to be scraped off with a knife blade or credit card edge. o Place tick in a sealed container (e.g. glass jar, zip lock plastic bag), in   case your doctor wants to see it. 3. Tick's Head Removal:  o If the wood tick's head breaks off in the skin, it must be removed. Clean the skin. Then use a sterile needle to uncover the head and lift it out or scrape it off.  o If a very small piece of the head remains, the skin will eventually slough it off. 4. Antibiotic Ointment:  o Wash the wound and your hands with soap and water after removal to prevent catching any tick disease.  Apply an over the counter antibiotic ointment (e.g. bacitracin) to the bite once. 5. Expected Course: Tick bites normally don't itch or hurt. That's  why they often go unnoticed. 6. Call Your Doctor If:  o You can't remove the tick or the tick's head o Fever, a severe head ache, or rash occur in the next 2 weeks o Bite begins to look infected o Lyme's disease is common in your area o You have not had a tetanus in the last 10 years o Your current symptoms become worse    MAKE SURE YOU   Understand these instructions.  Will watch your condition.  Will get help right away if you are not doing well or get worse.   Thank you for choosing an e-visit.  Your e-visit answers were reviewed by a board certified advanced clinical practitioner to complete your personal care plan. Depending upon the condition, your plan could have included both over the counter or prescription medications. Please review your pharmacy choice. If there is a problem you may use MyChart messaging to have the prescription routed to another pharmacy. Your safety is important to us. If you have drug allergies check your prescription carefully.   You can use MyChart to ask questions about today's visit, request a non-urgent call back, or ask for a work or school excuse for 24 hours related to this e-Visit. If it has been greater than 24 hours you will need to follow up with your provider, or enter a new e-Visit to address those concerns.  You will get an email in the next two days asking about your experience. I hope  that your e-visit has been valuable and will speed your recovery  Approximately 5 minutes was spent documenting and reviewing patient's chart.    

## 2020-07-02 DIAGNOSIS — R519 Headache, unspecified: Secondary | ICD-10-CM | POA: Diagnosis not present

## 2020-07-02 DIAGNOSIS — M542 Cervicalgia: Secondary | ICD-10-CM | POA: Diagnosis not present

## 2020-07-04 ENCOUNTER — Ambulatory Visit (INDEPENDENT_AMBULATORY_CARE_PROVIDER_SITE_OTHER): Payer: PPO | Admitting: Family

## 2020-07-04 ENCOUNTER — Other Ambulatory Visit: Payer: Self-pay

## 2020-07-04 ENCOUNTER — Encounter: Payer: Self-pay | Admitting: Family

## 2020-07-04 VITALS — BP 150/91 | HR 65 | Temp 97.4°F | Ht 61.0 in | Wt 203.4 lb

## 2020-07-04 DIAGNOSIS — E7800 Pure hypercholesterolemia, unspecified: Secondary | ICD-10-CM | POA: Diagnosis not present

## 2020-07-04 DIAGNOSIS — Z09 Encounter for follow-up examination after completed treatment for conditions other than malignant neoplasm: Secondary | ICD-10-CM

## 2020-07-04 DIAGNOSIS — R519 Headache, unspecified: Secondary | ICD-10-CM | POA: Diagnosis not present

## 2020-07-04 DIAGNOSIS — E039 Hypothyroidism, unspecified: Secondary | ICD-10-CM

## 2020-07-04 DIAGNOSIS — W57XXXA Bitten or stung by nonvenomous insect and other nonvenomous arthropods, initial encounter: Secondary | ICD-10-CM | POA: Diagnosis not present

## 2020-07-04 DIAGNOSIS — S0096XA Insect bite (nonvenomous) of unspecified part of head, initial encounter: Secondary | ICD-10-CM | POA: Diagnosis not present

## 2020-07-04 NOTE — Progress Notes (Signed)
Subjective:    Patient ID: Alicia Russo, female    DOB: 1956/06/13, 64 y.o.   MRN: [id number]  Chief Complaint  Patient presents with  . Follow-up   PT presents to the office today for a Urgent Care follow up from a headache. She reports she started having pain behind her left ear. 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 She was given toradol and a prescription flexeril. She was also given prednisone, but has not started this yet.   She also reports she had a tick bite in 04/2020 that was behind her left ear. She reports she completed her doxycyline for 21 days.   She reports her headache has resolved, but yesterday afternoon she did has a slight headache for a few hours.  Headache  This is a new problem. The current episode started 1 to 4 weeks ago. The problem has been resolved. The pain is located in the left unilateral region. The pain quality is not similar to prior headaches. Associated symptoms include nausea. Pertinent negatives include no abnormal behavior, blurred vision, dizziness, facial sweating, hearing loss, muscle aches, phonophobia, photophobia or vomiting. Exacerbated by: walking. She has tried ketorolac injections for the symptoms. The treatment provided mild relief. Her past medical history is significant for migraine headaches.      Review of Systems  HENT: Negative for hearing loss.   Eyes: Negative for blurred vision and photophobia.  Gastrointestinal: Positive for nausea. Negative for vomiting.  Neurological: Positive for headaches. Negative for dizziness.  All other systems reviewed and are negative.      Objective:   Physical Exam Vitals reviewed.  Constitutional:      General: She is not in acute distress.    Appearance: She is well-developed. She is obese.  HENT:     Head: Normocephalic and atraumatic.     Right Ear: Tympanic membrane normal.     Left Ear:  Tympanic membrane normal.  Eyes:     Pupils: Pupils are equal, round, and reactive to light.  Neck:     Thyroid: No thyromegaly.  Cardiovascular:     Rate and Rhythm: Normal rate and regular rhythm.     Heart sounds: Normal heart sounds. No murmur heard.   Pulmonary:     Effort: Pulmonary effort is normal. No respiratory distress.     Breath sounds: Normal breath sounds. No wheezing.  Abdominal:     General: Bowel sounds are normal. There is no distension.     Palpations: Abdomen is soft.     Tenderness: There is no abdominal tenderness.  Musculoskeletal:        General: No tenderness. Normal range of motion.     Cervical back: Normal range of motion and neck supple.  Skin:    General: Skin is warm and dry.  Neurological:     Mental Status: She is alert and oriented to person, place, and time.     Cranial Nerves: No cranial nerve deficit.     Deep Tendon Reflexes: Reflexes are normal and symmetric.  Psychiatric:        Behavior: Behavior normal.        Thought Content: Thought content normal.        Judgment: Judgment normal.       BP (!) 150/91   Pulse 65   Temp (!) 97.4 F (36.3 C) (Temporal)   Ht  _0  (1.549 m)   Wt (!) 203 lb 6.4 oz (92.3 kg)   SpO2 100%   BMI 38.43 kg/m      Assessment & Plan:  Alicia Russo comes in today with chief complaint of Follow-up   Diagnosis and orders addressed:  1. Tick bite of head, initial encounter -Will check Lyme and RMSF today, as patient is very anxious as this may be causing her headache - BMP8+EGFR - Lyme Ab/Western Blot Reflex - Rocky mtn spotted fvr abs pnl(IgG+IgM)  2. Occipital headache Resolved I would hold off on taking prednisone at this time since headache is resolved.  Stress management  - BMP8+EGFR - Lyme Ab/Western Blot Reflex - Rocky mtn spotted fvr abs pnl(IgG+IgM)  3. Follow up Urgent Care notes reviewed    Labs pending Health Maintenance reviewed Diet and exercise encouraged  Follow up  plan: As needed and keep follow up with PCP   Evelina Dun, FNP

## 2020-07-04 NOTE — Patient Instructions (Signed)
General Headache Without Cause A headache is pain or discomfort felt around the head or neck area. The specific cause of a headache may not be found. There are many causes and types of headaches. A few common ones are:  Tension headaches.  Migraine headaches.  Cluster headaches.  Chronic daily headaches. Follow these instructions at home: Watch your condition for any changes. Let your health care provider know about them. Take these steps to help with your condition: Managing pain      Take over-the-counter and prescription medicines only as told by your health care provider.  Lie down in a dark, quiet room when you have a headache.  If directed, put ice on your head and neck area: ? Put ice in a plastic bag. ? Place a towel between your skin and the bag. ? Leave the ice on for 20 minutes, 2-3 times per day.  If directed, apply heat to the affected area. Use the heat source that your health care provider recommends, such as a moist heat pack or a heating pad. ? Place a towel between your skin and the heat source. ? Leave the heat on for 20-30 minutes. ? Remove the heat if your skin turns bright red. This is especially important if you are unable to feel pain, heat, or cold. You may have a greater risk of getting burned.  Keep lights dim if bright lights bother you or make your headaches worse. Eating and drinking  Eat meals on a regular schedule.  If you drink alcohol: ? Limit how much you use to:  0-1 drink a day for women.  0-2 drinks a day for men. ? Be aware of how much alcohol is in your drink. In the [place] one drink equals one 12 oz bottle of beer (355 mL), one 5 oz glass of wine (148 mL), or one 1 oz glass of hard liquor (44 mL).  Stop drinking caffeine, or decrease the amount of caffeine you drink. General instructions   Keep a headache journal to help find out what may trigger your headaches. For example, write down: ? What you eat and drink. ? How much  sleep you get. ? Any change to your diet or medicines.  Try massage or other relaxation techniques.  Limit stress.  Sit up straight, and do not tense your muscles.  Do not use any products that contain nicotine or tobacco, such as cigarettes, e-cigarettes, and chewing tobacco. If you need help quitting, ask your health care provider.  Exercise regularly as told by your health care provider.  Sleep on a regular schedule. Get 7-9 hours of sleep each night, or the amount recommended by your health care provider.  Keep all follow-up visits as told by your health care provider. This is important. Contact a health care provider if:  Your symptoms are not helped by medicine.  You have a headache that is different from the usual headache.  You have nausea or you vomit.  You have a fever. Get help right away if:  Your headache becomes severe quickly.  Your headache gets worse after moderate to intense physical activity.  You have repeated vomiting.  You have a stiff neck.  You have a loss of vision.  You have problems with speech.  You have pain in the eye or ear.  You have muscular weakness or loss of muscle control.  You lose your balance or have trouble walking.  You feel faint or pass out.  You have confusion.    You have a seizure. Summary  A headache is pain or discomfort felt around the head or neck area.  There are many causes and types of headaches. In some cases, the cause may not be found.  Keep a headache journal to help find out what may trigger your headaches. Watch your condition for any changes. Let your health care provider know about them.  Contact a health care provider if you have a headache that is different from the usual headache, or if your symptoms are not helped by medicine.  Get help right away if your headache becomes severe, you vomit, you have a loss of vision, you lose your balance, or you have a seizure. This information is not  intended to replace advice given to you by your health care provider. Make sure you discuss any questions you have with your health care provider. Document Revised: 06/20/2018 Document Reviewed: 06/20/2018 Elsevier Patient Education  2020 [employer]  

## 2020-07-05 LAB — THYROID PANEL WITH TSH
Free Thyroxine Index: 2.6 (ref 1.2–4.9)
T3 Uptake Ratio: 28 % (ref 24–39)
T4, Total: 9.2 ug/dL (ref 4.5–12.0)
TSH: 2.2 u[IU]/mL (ref 0.450–4.500)

## 2020-07-05 LAB — LIPID PANEL
Chol/HDL Ratio: 4.2 ratio (ref 0.0–4.4)
Cholesterol, Total: 203 mg/dL — ABNORMAL HIGH (ref 100–199)
HDL: 48 mg/dL (ref >39)
LDL Chol Calc (NIH): 120 mg/dL — ABNORMAL HIGH (ref 0–99)
Triglycerides: 197 mg/dL — ABNORMAL HIGH (ref 0–149)
VLDL Cholesterol Cal: 35 mg/dL (ref 5–40)

## 2020-07-06 ENCOUNTER — Encounter: Payer: Self-pay | Admitting: Family Medicine

## 2020-07-08 LAB — ROCKY MTN SPOTTED FVR ABS PNL(IGG+IGM)
RMSF IgG: NEGATIVE
RMSF IgM: 0.43 index (ref 0.00–0.89)

## 2020-07-08 LAB — BMP8+EGFR
BUN/Creatinine Ratio: 12 (ref 12–28)
BUN: 8 mg/dL (ref 8–27)
CO2: 27 mmol/L (ref 20–29)
Calcium: 9 mg/dL (ref 8.7–10.3)
Chloride: 104 mmol/L (ref 96–106)
Creatinine, Ser: 0.66 mg/dL (ref 0.57–1.00)
GFR calc Af Amer: 109 mL/min/{1.73_m2} (ref >59)
GFR calc non Af Amer: 94 mL/min/{1.73_m2} (ref >59)
Glucose: 88 mg/dL (ref 65–99)
Potassium: 4 mmol/L (ref 3.5–5.2)
Sodium: 142 mmol/L (ref 134–144)

## 2020-07-08 LAB — LYME AB/WESTERN BLOT REFLEX
LYME DISEASE AB, QUANT, IGM: <0.8 index (ref 0.00–0.79)
Lyme IgG/IgM Ab: <0.91 {ISR} (ref 0.00–0.90)

## 2020-07-08 MED ORDER — EZETIMIBE 10 MG PO TABS
10.0000 mg | ORAL_TABLET | Freq: Every day | ORAL | 1 refills | Status: DC
Start: 2020-07-08 — End: 2020-12-31

## 2020-07-16 ENCOUNTER — Telehealth: Payer: Self-pay | Admitting: *Deleted

## 2020-07-16 ENCOUNTER — Telehealth: Payer: PPO | Admitting: *Deleted

## 2020-07-16 DIAGNOSIS — J4520 Mild intermittent asthma, uncomplicated: Secondary | ICD-10-CM

## 2020-07-16 DIAGNOSIS — E7800 Pure hypercholesterolemia, unspecified: Secondary | ICD-10-CM

## 2020-07-16 DIAGNOSIS — M810 Age-related osteoporosis without current pathological fracture: Secondary | ICD-10-CM

## 2020-07-16 DIAGNOSIS — E039 Hypothyroidism, unspecified: Secondary | ICD-10-CM

## 2020-07-16 DIAGNOSIS — I10 Essential (primary) hypertension: Secondary | ICD-10-CM

## 2020-07-16 NOTE — Telephone Encounter (Signed)
  Chronic Care Management   Outreach Note  07/16/2020 Name: Alicia Russo MRN: [id number] DOB: 16-Aug-1956  Referred by: Raliegh Ip, DO Reason for referral : Chronic Care Management (Initial Visit Outreach)   An unsuccessful Initial Telephone outreach was attempted today. The patient was referred to the case management team for assistance with care management and care coordination.   Clinical Goals: . Over the next 10 days, patient will be contacted by a Care Guide to reschedule their Initial CCM Visit . Over the next 30 days, patient will have an Initial CCM Visit with a member of the embedded CCM team to discuss self-management of their chronic medical conditions  Interventions and Plan . Chart reviewed in preparation for initial visit telephone call . Collaboration with other care team members as needed . Unsuccessful outreach to patient  . A HIPAA compliant phone message was left for the patient providing contact information and requesting a return call.  . Request sent to care guides to reach out and reschedule patient's initial visit   Demetrios Loll, BSN, RN-BC Embedded Chronic Care Manager [facility] Care Management Direct Dial: [phone]

## 2020-07-17 ENCOUNTER — Telehealth: Payer: Self-pay

## 2020-07-17 NOTE — Chronic Care Management (AMB) (Signed)
  Care Management   Note  07/17/2020 Name: Alicia Russo MRN: [id number] DOB: Mar 02, 1956  Alicia Russo is a 65 y.o. year old female who is a primary care patient of Raliegh Ip, DO and is actively engaged with the care management team. I reached out to [name] by phone today to assist with re-scheduling an initial visit with the RN Case Manager  Follow up plan: Unsuccessful telephone outreach attempt made. A HIPPA compliant phone message was left for the patient providing contact information and requesting a return call.  The care management team will reach out to the patient again over the next 7 days.  If patient returns call to provider office, please advise to call Embedded Care Management Care Guide Alicia Russo  at [phone]  Alicia Russo, RMA Care Guide, Embedded Care Coordination [facility]  [place], [place] [address] Direct Dial: [phone] [email or web address]: [email or web address]

## 2020-07-18 NOTE — Chronic Care Management (AMB) (Signed)
  Care Management   Note  07/18/2020 Name: NEYSHA CRIADO MRN: [id number] DOB: July 12, 1956  Alicia Russo is a 64 y.o. year old female who is a primary care patient of Raliegh Ip, DO and is actively engaged with the care management team. I reached out to [name] by phone today to assist with re-scheduling an initial visit with the RN Case Manager  Follow up plan: Telephone appointment with care management team member scheduled for:08/26/2020  Penne Lash, RMA Care Guide, Embedded Care Coordination [facility]  [place], [place] [address] Direct Dial: [phone] [email or web address] Website: [email or web address]

## 2020-08-07 NOTE — Telephone Encounter (Signed)
Pt has been R/S for 08/26/2020

## 2020-08-26 ENCOUNTER — Ambulatory Visit (INDEPENDENT_AMBULATORY_CARE_PROVIDER_SITE_OTHER): Payer: PPO | Admitting: *Deleted

## 2020-08-26 DIAGNOSIS — E7800 Pure hypercholesterolemia, unspecified: Secondary | ICD-10-CM

## 2020-08-26 DIAGNOSIS — I10 Essential (primary) hypertension: Secondary | ICD-10-CM | POA: Diagnosis not present

## 2020-08-26 DIAGNOSIS — E039 Hypothyroidism, unspecified: Secondary | ICD-10-CM | POA: Diagnosis not present

## 2020-08-26 DIAGNOSIS — G43809 Other migraine, not intractable, without status migrainosus: Secondary | ICD-10-CM

## 2020-08-26 NOTE — Patient Instructions (Addendum)
°  Visit Information  Goals Addressed            This Visit's Progress    Chronic Disease Management Needs       CARE PLAN ENTRY (see longtitudinal plan of care for additional care plan information)  Current Barriers:   Chronic Disease Management support, education, and care coordination needs related to HTN, asthma, GERD, hypothyroidism, HLD, migraine, osteoporosis, memory problems (s/p brain aneurysm surgery and seizure 20 years ago)  Clinical Goal(s) related to HTN, asthma, GERD, hypothyroidism, HLD, migraine, osteoporosis, memory problems (s/p brain aneurysm surgery and seizure 20 years ago):  Over the next 60 days, patient will:   Work with the care management team to address educational, disease management, and care coordination needs   Begin or continue self health monitoring activities as directed today Measure and record blood pressure 3 times per week  Call provider office for new or worsened signs and symptoms Blood pressure findings outside established parameters  Call care management team with questions or concerns  Verbalize basic understanding of patient centered plan of care established today  Interventions related to HTN, asthma, GERD, hypothyroidism, HLD, migraine, osteoporosis, memory problems (s/p brain aneurysm surgery and seizure 20 years ago):   Evaluation of current treatment plans and patient's adherence to plan as established by provider  Assessed patient understanding of disease states  Assessed patient's education and care coordination needs  Provided disease specific education to patient   Collaborated with appropriate clinical care team members regarding patient needs  Discussed social support o 2 children, 4 grandchildren, mother, and neighbors  Discussed memory loss and problems with short term memory since aneurysm and temporal lobe surgery approx 2 years ago  Discussed ability to perform ADLs  Discussed mobility and physical activity  level  Reviewed upcoming appointments  Provided with RN Care Manager contact number and encouraged to reach out as needed  Patient Self Care Activities related to HTN, asthma, GERD, hypothyroidism, HLD, migraine, osteoporosis, memory problems (s/p brain aneurysm surgery and seizure 20 years ago):   Patient is unable to independently self-manage chronic health conditions  Initial goal documentation        Patient verbalizes understanding of instructions provided today.   Follow-up Plan The care management team will reach out to the patient again over the next 60 days.   Demetrios Loll, BSN, RN-BC Embedded Chronic Care Manager [facility] Care Management Direct Dial: [phone]

## 2020-08-30 NOTE — Chronic Care Management (AMB) (Signed)
Chronic Care Management   Initial Visit Note 08/26/2020 Name: Alicia Russo MRN: [id number] DOB: [birth year]  Referred by: Raliegh Ip, DO Reason for referral : Chronic Care Management (Initial Visit)   Alicia Russo is a 64 y.o. year old female who is a primary care patient of Raliegh Ip, DO. The CCM team was consulted for assistance with chronic disease management and care coordination needs related to HTN, asthma, GERD, hypothyroidism, HLD, migraine, osteoporosis, memory problems (s/p brain aneurysm surgery and seizure 20 years ago)   Review of patient status, including review of consultants reports, relevant laboratory and other test results, and collaboration with appropriate care team members and the patient's provider was performed as part of comprehensive patient evaluation and provision of chronic care management services.    Subjective: Talked with Alicia Russo by telephone today regarding management of her chronic medical conditions.   SDOH (Social Determinants of Health) assessments performed: Yes See Care Plan activities for detailed interventions related to SDOH     Objective: Outpatient Encounter Medications as of 08/26/2020  Medication Sig  . albuterol (PROVENTIL) (2.5 MG/3ML) 0.083% nebulizer solution USE ONE vial in nebulizer EVERY 6 HOURS AS NEEDED wheezing SHORTNESS OF BREATH  . albuterol (VENTOLIN HFA) 108 (90 Base) MCG/ACT inhaler INHALE 2 PUFFS INTO THE LUNGS EVERY 6 HOURS AS NEEDED FOR WHEEZING OR SHORTNESS OF BREATH.  Marland Kitchen ALPRAZolam (XANAX) 0.5 MG tablet Take 0.5-1 tablets (0.25-0.5 mg total) by mouth 2 (two) times daily as needed for anxiety.  Marland Kitchen aspirin 81 MG EC tablet Take 81 mg by mouth 2 (two) times daily.   . Biotin 10 MG TABS Take 1 tablet by mouth 2 (two) times daily.   . cetirizine (ZYRTEC) 10 MG tablet Take 1 tablet (10 mg total) by mouth daily.  . Cholecalciferol (VITAMIN D3) 2000 UNITS TABS Take 1 tablet by mouth 2 (two) times daily.  .  Cyanocobalamin (VITAMIN B-12 PO) Take 1 tablet by mouth 2 (two) times daily.  . cyclobenzaprine (FLEXERIL) 10 MG tablet Take by mouth.  . ezetimibe (ZETIA) 10 MG tablet Take 1 tablet (10 mg total) by mouth daily.  Marland Kitchen ibuprofen (ADVIL,MOTRIN) 800 MG tablet TAKE ONE TABLET BY MOUTH THREE TIMES DAILY (Patient taking differently: TAKE ONE TABLET BY MOUTH THREE TIMES DAILY prn)  . levothyroxine (SYNTHROID) 137 MCG tablet Take 1 tablet (137 mcg total) by mouth daily.  Marland Kitchen losartan (COZAAR) 50 MG tablet Take 1 tablet (50 mg total) by mouth daily.  . magnesium 30 MG tablet Take 30 mg by mouth 2 (two) times daily.  . methocarbamol (ROBAXIN) 500 MG tablet TAKE 1 TABLET BY MOUTH EVERY 6 HOURS AS NEEDED FOR MUSCLE SPASMS  . montelukast (SINGULAIR) 10 MG tablet Take 1 tablet (10 mg total) by mouth every evening.  . Multiple Vitamin (MULTIVITAMIN) tablet Take 1 tablet by mouth daily.  Marland Kitchen omeprazole (PRILOSEC) 20 MG capsule Take 1 capsule (20 mg total) by mouth daily.  . predniSONE (DELTASONE) 5 MG tablet Take 6-5-4-3-2-1 po qd   No facility-administered encounter medications on file as of 08/26/2020.     BP Readings from Last 3 Encounters:  07/04/20 (!) 150/91  03/18/20 134/72  02/24/19 (!) 146/91   No results found for: HGBA1C Lab Results  Component Value Date   LDLCALC 120 (H) 07/04/2020   CREATININE 0.66 07/04/2020     RN Care Plan: Goals Addressed            This Visit's Progress   . Chronic  Disease Management Needs       CARE PLAN ENTRY (see longtitudinal plan of care for additional care plan information)  Current Barriers:  . Chronic Disease Management support, education, and care coordination needs related to HTN, asthma, GERD, hypothyroidism, HLD, migraine, osteoporosis, memory problems (s/p brain aneurysm surgery and seizure 20 years ago)  Clinical Goal(s) related to HTN, asthma, GERD, hypothyroidism, HLD, migraine, osteoporosis, memory problems (s/p brain aneurysm surgery and seizure  20 years ago):  Over the next 60 days, patient will:  . Work with the care management team to address educational, disease management, and care coordination needs  . Begin or continue self health monitoring activities as directed today Measure and record blood pressure 3 times per week . Call provider office for new or worsened signs and symptoms Blood pressure findings outside established parameters . Call care management team with questions or concerns . Verbalize basic understanding of patient centered plan of care established today  Interventions related to HTN, asthma, GERD, hypothyroidism, HLD, migraine, osteoporosis, memory problems (s/p brain aneurysm surgery and seizure 20 years ago):  Marland Kitchen Evaluation of current treatment plans and patient's adherence to plan as established by provider . Assessed patient understanding of disease states . Assessed patient's education and care coordination needs . Provided disease specific education to patient  . Collaborated with appropriate clinical care team members regarding patient needs . Discussed social support o 2 children, 4 grandchildren, mother, and neighbors . Discussed memory loss and problems with short term memory since aneurysm and temporal lobe surgery approx 2 years ago . Discussed ability to perform ADLs . Discussed mobility and physical activity level . Reviewed upcoming appointments . Provided with RN Care Manager contact number and encouraged to reach out as needed  Patient Self Care Activities related to HTN, asthma, GERD, hypothyroidism, HLD, migraine, osteoporosis, memory problems (s/p brain aneurysm surgery and seizure 20 years ago):  Marland Kitchen Patient is unable to independently self-manage chronic health conditions  Initial goal documentation         Plan:   The care management team will reach out to the patient again over the next 60 days.    Demetrios Loll, BSN, RN-BC Embedded Chronic Care Manager [facility] Care Management Direct Dial: [phone]

## 2020-09-17 ENCOUNTER — Ambulatory Visit (INDEPENDENT_AMBULATORY_CARE_PROVIDER_SITE_OTHER): Payer: PPO | Admitting: Family Medicine

## 2020-09-17 ENCOUNTER — Encounter: Payer: Self-pay | Admitting: Family Medicine

## 2020-09-17 ENCOUNTER — Other Ambulatory Visit: Payer: Self-pay

## 2020-09-17 VITALS — BP 119/62 | HR 71 | Temp 97.6°F | Ht 60.0 in | Wt 204.0 lb

## 2020-09-17 DIAGNOSIS — M797 Fibromyalgia: Secondary | ICD-10-CM

## 2020-09-17 DIAGNOSIS — F411 Generalized anxiety disorder: Secondary | ICD-10-CM | POA: Diagnosis not present

## 2020-09-17 DIAGNOSIS — G43809 Other migraine, not intractable, without status migrainosus: Secondary | ICD-10-CM

## 2020-09-17 DIAGNOSIS — E034 Atrophy of thyroid (acquired): Secondary | ICD-10-CM

## 2020-09-17 DIAGNOSIS — F410 Panic disorder [episodic paroxysmal anxiety] without agoraphobia: Secondary | ICD-10-CM | POA: Diagnosis not present

## 2020-09-17 DIAGNOSIS — E7800 Pure hypercholesterolemia, unspecified: Secondary | ICD-10-CM

## 2020-09-17 MED ORDER — METHOCARBAMOL 500 MG PO TABS: ORAL_TABLET | ORAL | 0 refills | Status: DC

## 2020-09-17 MED ORDER — NURTEC 75 MG PO TBDP: 1.0000 | ORAL_TABLET | Freq: Every day | ORAL | 0 refills | Status: DC | PRN

## 2020-09-17 MED ORDER — IBUPROFEN 800 MG PO TABS: 800.0000 mg | ORAL_TABLET | Freq: Three times a day (TID) | ORAL | 0 refills | Status: DC | PRN

## 2020-09-17 MED ORDER — SAVELLA 50 MG PO TABS: 50.0000 mg | ORAL_TABLET | Freq: Two times a day (BID) | ORAL | 0 refills | Status: DC

## 2020-09-17 NOTE — Progress Notes (Signed)
Subjective: CC: f/u generalized anxiety disorder with panic, hypothyroidism PCP: Janora Norlander, DO OIZ:TIWP S Alicia Russo is a 64 y.o. female presenting to clinic today for:  1.  Hypothyroidism History: Acquired atrophy.  No history of radiation or surgery to the neck.  No known family history of thyroid disorder.  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    She reports compliance with Synthroid 137 mcg daily. No tremor, palpitations.  2.  Anxiety disorder with panic/ fibromyalgia/ migraines/ HLD Patient feels that she has been a fibromyalgia flare since discontinuing Savella.  She is unsure if this is related to the initiation of Zetia but she does not feel nearly as achy as when she was on the statin with Zetia.  No chest pain, shortness of breath.  She continues to have intermittent balance issues but these are stable from previous.  She continues to have intermittent migraine headaches occurring 1-2 times per week.  She utilizes her Motrin 800 mg and/or Robaxin for severe headaches.  And also sometimes she will use her alprazolam if super severe.  She has never tried Nurtec before but she would be willing to.  Cymbalta caused increased moodiness, Lyrica was not helpful and gabapentin caused visual hallucinations.  She was intolerant to Imitrex as it was not effective.  She would like to go back on Mitchell.  She has 153m at home but was nauseated with that dose and therefore would like to try and go back to the 571m   ROS: Per HPI  Allergies  Allergen Reactions  . Bee Venom     Cant breath, swell up  . Cherry Flavor Hives      dizziness  . Citalopram Hydrobromide     Mood swings, gets "really really mean"  . Statins     Myalgia and muscle pain  . Tramadol     unknown   Past Medical History:  Diagnosis Date  . Anxiety   . Asthma   . AVM (arteriovenous malformation)    Intracranial with seizures  . Closed fracture  of left distal radius   . Fibromyalgia   . GERD (gastroesophageal reflux disease)   . Hyperlipidemia   . Hypothyroidism   . Plantar fasciitis of right foot   . Seizures ([facility]   AVM repair    Current Outpatient Medications:  .  albuterol (PROVENTIL) (2.5 MG/3ML) 0.083% nebulizer solution, USE ONE vial in nebulizer EVERY 6 HOURS AS NEEDED wheezing SHORTNESS OF BREATH, Disp: 240 mL, Rfl: 5 .  albuterol (VENTOLIN HFA) 108 (90 Base) MCG/ACT inhaler, INHALE 2 PUFFS INTO THE LUNGS EVERY 6 HOURS AS NEEDED FOR WHEEZING OR SHORTNESS OF BREATH., Disp: 18 g, Rfl: 5 .  ALPRAZolam (XANAX) 0.5 MG tablet, Take 0.5-1 tablets (0.25-0.5 mg total) by mouth 2 (two) times daily as needed for anxiety., Disp: 40 tablet, Rfl: 0 .  aspirin 81 MG EC tablet, Take 81 mg by mouth 2 (two) times daily. , Disp: , Rfl:  .  Biotin 10 MG TABS, Take 1 tablet by mouth 2 (two) times daily. , Disp: , Rfl:  .  cetirizine (ZYRTEC) 10 MG tablet, Take 1 tablet (10 mg total) by mouth daily., Disp: 90 tablet, Rfl: 3 .  Cholecalciferol (VITAMIN D3) 2000 UNITS TABS, Take 1 tablet by mouth 2 (two) times daily., Disp: , Rfl:  .  Cyanocobalamin (VITAMIN B-12 PO), Take 1 tablet by mouth 2 (two) times daily., Disp: , Rfl:  .  cyclobenzaprine (FLEXERIL) 10 MG tablet, Take by mouth., Disp: , Rfl:  .  ezetimibe (ZETIA) 10 MG tablet, Take 1 tablet (10 mg total) by mouth daily., Disp: 90 tablet, Rfl: 1 .  ibuprofen (ADVIL,MOTRIN) 800 MG tablet, TAKE ONE TABLET BY MOUTH THREE TIMES DAILY (Patient taking differently: TAKE ONE TABLET BY MOUTH THREE TIMES DAILY prn), Disp: 90 tablet, Rfl: 2 .  levothyroxine (SYNTHROID) 137 MCG tablet, Take 1 tablet (137 mcg total) by mouth daily., Disp: 90 tablet, Rfl: 3 .  losartan (COZAAR) 50 MG tablet, Take 1 tablet (50 mg total) by mouth daily., Disp: 90 tablet, Rfl: 3 .  magnesium 30 MG tablet, Take 30 mg by mouth 2 (two) times daily., Disp: , Rfl:  .  methocarbamol (ROBAXIN) 500 MG tablet, TAKE 1 TABLET BY MOUTH  EVERY 6 HOURS AS NEEDED FOR MUSCLE SPASMS, Disp: 120 tablet, Rfl: 2 .  montelukast (SINGULAIR) 10 MG tablet, Take 1 tablet (10 mg total) by mouth every evening., Disp: 90 tablet, Rfl: 3 .  Multiple Vitamin (MULTIVITAMIN) tablet, Take 1 tablet by mouth daily., Disp: , Rfl:  .  omeprazole (PRILOSEC) 20 MG capsule, Take 1 capsule (20 mg total) by mouth daily., Disp: 90 capsule, Rfl: 3 .  predniSONE (DELTASONE) 5 MG tablet, Take 6-5-4-3-2-1 po qd, Disp: , Rfl:  Social History   Socioeconomic History  . Marital status: Single    Spouse name: Not on file  . Number of children: 2  . Years of education: Not on file  . Highest education level: GED or equivalent  Occupational History  . Not on file  Tobacco Use  . Smoking status: Former Smoker    Years: 20.00    Quit date: 04/30/1999    Years since quitting: 21.4  . Smokeless tobacco: Never Used  Vaping Use  . Vaping Use: Never used  Substance and Sexual Activity  . Alcohol use: No    Alcohol/week: 0.0 standard drinks  . Drug use: No  . Sexual activity: Not on file  Other Topics Concern  . Not on file  Social History Narrative  . Not on file   Social Determinants of Health   Financial Resource Strain: Low Risk   . Difficulty of Paying Living Expenses: Not hard at all  Food Insecurity: No Food Insecurity  . Worried About Charity fundraiser in the Last Year: Never true  . Ran Out of Food in the Last Year: Never true  Transportation Needs: No Transportation Needs  . Lack of Transportation (Medical): No  . Lack of Transportation (Non-Medical): No  Physical Activity: Sufficiently Active  . Days of Exercise per Week: 6 days  . Minutes of Exercise per Session: 30 min  Stress: No Stress Concern Present  . Feeling of Stress : Only a little  Social Connections: Moderately Integrated  . Frequency of Communication with Friends and Family: More than three times a week  . Frequency of Social Gatherings with Friends and Family: More than  three times a week  . Attends Religious Services: More than 4 times per year  . Active Member of Clubs or Organizations: Yes  . Attends Archivist Meetings: More than 4 times per year  . Marital Status: Divorced  Human resources officer Violence:   . Fear of Current or Ex-Partner: Not on file  . Emotionally Abused: Not on file  . Physically Abused: Not on file  . Sexually Abused: Not on file   Family History  Problem Relation Age of Onset  .  Emphysema Father        died at age 20  . COPD Father   . Hypertension Mother     Objective: Office vital signs reviewed. BP 119/62   Pulse 71   Temp 97.6 F (36.4 C) (Temporal)   Ht 5' (1.524 m)   Wt 204 lb (92.5 kg)   SpO2 96%   BMI 39.84 kg/m   Physical Examination:  General: Awake, alert, well nourished, No acute distress HEENT: Normal; no goiter.  No exophthalmos. Cardio: regular rate and rhythm, S1S2 heard, no murmurs appreciated Pulm: clear to auscultation bilaterally, no wheezes, rhonchi or rales; normal work of breathing on room air Extremities: warm, well perfused, No edema, cyanosis or clubbing; +2 pulses bilaterally MSK: normal gait and station Skin: dry; intact; no rashes or lesions; normal temperature Psych: Mood stable, speech normal, affect appropriate, pleasant and interactive Depression screen [facility] 2/9 03/18/2020 02/24/2019 12/20/2018  Decreased Interest 0 0 0  Down, Depressed, Hopeless 0 0 0  PHQ - 2 Score 0 0 0  Altered sleeping 0 - -  Tired, decreased energy 0 - -  Change in appetite 0 - -  Feeling bad or failure about yourself  0 - -  Trouble concentrating 0 - -  Moving slowly or fidgety/restless 0 - -  Suicidal thoughts 0 - -  PHQ-9 Score 0 - -   GAD 7 : Generalized Anxiety Score 03/18/2020  Nervous, Anxious, on Edge 1  Control/stop worrying 0  Worry too much - different things 0  Trouble relaxing 1  Restless 2  Easily annoyed or irritable 1  Afraid - awful might happen 0  Total GAD 7 Score 5    Anxiety Difficulty Not difficult at all    Assessment/ Plan: 64 y.o. female   1. Hypothyroidism due to acquired atrophy of thyroid Asymptomatic - Thyroid Panel With TSH  2. Generalized anxiety disorder with panic attacks Start back on Dewey.  No need for xanax at this time. - CMP14+EGFR  3. Pure hypercholesterolemia - LDL Cholesterol, Direct - CMP14+EGFR  4. Other migraine without status migrainosus, not intractable Sample of Nurtec provided.  Discussed using Robaxin extremely sparingly and discussed the addictive nature of this muscle relaxer.  Advised to use the Advil separately from her SNRI.  We discussed the risk of GI bleed with this medicine - Rimegepant Sulfate (NURTEC) 75 MG TBDP; Take 1 tablet by mouth daily as needed (migraine).  Dispense: 2 tablet; Refill: 0 - methocarbamol (ROBAXIN) 500 MG tablet; TAKE 1 TABLET BY MOUTH EVERY 6 HOURS AS NEEDED FOR MUSCLE SPASMS  Dispense: 40 tablet; Refill: 0 - ibuprofen (ADVIL) 800 MG tablet; Take 1 tablet (800 mg total) by mouth every 8 (eight) hours as needed for headache or moderate pain.  Dispense: 90 tablet; Refill: 0  5. Fibromyalgia Savella renewed.  Symptoms are refractory to Lyrica, Cymbalta and gabapentin.  She had either adverse side effects or ineffectiveness of these medicines - Milnacipran (SAVELLA) 50 MG TABS tablet; Take 1 tablet (50 mg total) by mouth 2 (two) times daily.  Dispense: 180 tablet; Refill: 0 - methocarbamol (ROBAXIN) 500 MG tablet; TAKE 1 TABLET BY MOUTH EVERY 6 HOURS AS NEEDED FOR MUSCLE SPASMS  Dispense: 40 tablet; Refill: 0 - ibuprofen (ADVIL) 800 MG tablet; Take 1 tablet (800 mg total) by mouth every 8 (eight) hours as needed for headache or moderate pain.  Dispense: 90 tablet; Refill: 0   No orders of the defined types were placed in this encounter.  No orders of the defined types were placed in this encounter.    Janora Norlander, DO [place] [phone]

## 2020-09-17 NOTE — Patient Instructions (Signed)

## 2020-09-18 ENCOUNTER — Other Ambulatory Visit: Payer: Self-pay | Admitting: Family Medicine

## 2020-09-18 DIAGNOSIS — E039 Hypothyroidism, unspecified: Secondary | ICD-10-CM

## 2020-09-18 LAB — THYROID PANEL WITH TSH
Free Thyroxine Index: 2.5 (ref 1.2–4.9)
T3 Uptake Ratio: 27 % (ref 24–39)
T4, Total: 9.4 ug/dL (ref 4.5–12.0)
TSH: 1.42 u[IU]/mL (ref 0.450–4.500)

## 2020-09-18 LAB — LDL CHOLESTEROL, DIRECT: LDL Direct: 82 mg/dL (ref 0–99)

## 2020-09-18 LAB — CMP14+EGFR
ALT: 14 IU/L (ref 0–32)
AST: 14 IU/L (ref 0–40)
Albumin/Globulin Ratio: 1.8 (ref 1.2–2.2)
Albumin: 3.8 g/dL (ref 3.8–4.8)
Alkaline Phosphatase: 89 IU/L (ref 44–121)
BUN/Creatinine Ratio: 10 — ABNORMAL LOW (ref 12–28)
BUN: 6 mg/dL — ABNORMAL LOW (ref 8–27)
Bilirubin Total: 0.4 mg/dL (ref 0.0–1.2)
CO2: 23 mmol/L (ref 20–29)
Calcium: 8.9 mg/dL (ref 8.7–10.3)
Chloride: 104 mmol/L (ref 96–106)
Creatinine, Ser: 0.59 mg/dL (ref 0.57–1.00)
GFR calc Af Amer: 112 mL/min/{1.73_m2} (ref >59)
GFR calc non Af Amer: 97 mL/min/{1.73_m2} (ref >59)
Globulin, Total: 2.1 g/dL (ref 1.5–4.5)
Glucose: 87 mg/dL (ref 65–99)
Potassium: 3.9 mmol/L (ref 3.5–5.2)
Sodium: 142 mmol/L (ref 134–144)
Total Protein: 5.9 g/dL — ABNORMAL LOW (ref 6.0–8.5)

## 2020-09-18 MED ORDER — LEVOTHYROXINE SODIUM 137 MCG PO TABS: 137.0000 ug | ORAL_TABLET | Freq: Every day | ORAL | 3 refills | Status: DC

## 2020-09-24 ENCOUNTER — Telehealth: Payer: PPO | Admitting: Physician Assistant

## 2020-09-24 DIAGNOSIS — J019 Acute sinusitis, unspecified: Secondary | ICD-10-CM

## 2020-09-24 MED ORDER — ONDANSETRON HCL 4 MG PO TABS: 4.0000 mg | ORAL_TABLET | Freq: Three times a day (TID) | ORAL | 0 refills | Status: DC | PRN

## 2020-09-24 MED ORDER — AMOXICILLIN-POT CLAVULANATE 875-125 MG PO TABS
1.0000 | ORAL_TABLET | Freq: Two times a day (BID) | ORAL | 0 refills | Status: DC
Start: 2020-09-24 — End: 2020-12-20

## 2020-09-24 NOTE — Progress Notes (Signed)
We are sorry that you are not feeling well.  Here is how we plan to help!  Based on what you have shared with me it looks like you have sinusitis.  Sinusitis is inflammation and infection in the sinus cavities of the head.  Based on your presentation I believe you most likely have Acute Bacterial Sinusitis.  This is an infection caused by bacteria and is treated with antibiotics. HOWEVER, You should still be tested for Covid 19. Especially since covid 19 can cause sinus pressure, nausea and vomiting. Please go to your nearest testing center. Testing is done at most urgent care or primary care centers.    I have prescribed Augmentin 875mg /125mg  one tablet twice daily with food, for 7 days. You may use an oral decongestant such as Mucinex D or if you have glaucoma or high blood pressure use plain Mucinex. Saline nasal spray help and can safely be used as often as needed for congestion.  If you develop worsening sinus pain, fever or notice severe headache and vision changes, or if symptoms are not better after completion of antibiotic, please schedule an appointment with a health care provider.    Sinus infections are not as easily transmitted as other respiratory infection, however we still recommend that you avoid close contact with loved ones, especially the very young and elderly.  Remember to wash your hands thoroughly throughout the day as this is the number one way to prevent the spread of infection!  Home Care:  Only take medications as instructed by your medical team.  Complete the entire course of an antibiotic.  Do not take these medications with alcohol.  A steam or ultrasonic humidifier can help congestion.  You can place a towel over your head and breathe in the steam from hot water coming from a faucet.  Avoid close contacts especially the very young and the elderly.  Cover your mouth when you cough or sneeze.  Always remember to wash your hands.  Get Help Right Away If:  You  develop worsening fever or sinus pain.  You develop a severe head ache or visual changes.  Your symptoms persist after you have completed your treatment plan.  Make sure you  Understand these instructions.  Will watch your condition.  Will get help right away if you are not doing well or get worse.  Your e-visit answers were reviewed by a board certified advanced clinical practitioner to complete your personal care plan.  Depending on the condition, your plan could have included both over the counter or prescription medications.  If there is a problem please reply  once you have received a response from your provider.  Your safety is important to .  If you have drug allergies check your prescription carefully.    You can use MyChart to ask questions about today's visit, request a non-urgent call back, or ask for a work or school excuse for 24 hours related to this e-Visit. If it has been greater than 24 hours you will need to follow up with your provider, or enter a new e-Visit to address those concerns.  You will get an e-mail in the next two days asking about your experience.  I hope that your e-visit has been valuable and will speed your recovery. Thank you for using e-visits.  5 minutes spent charting

## 2020-09-25 DIAGNOSIS — Z20822 Contact with and (suspected) exposure to covid-19: Secondary | ICD-10-CM | POA: Diagnosis not present

## 2020-09-26 ENCOUNTER — Other Ambulatory Visit: Payer: Self-pay

## 2020-09-26 DIAGNOSIS — Z780 Asymptomatic menopausal state: Secondary | ICD-10-CM

## 2020-09-26 DIAGNOSIS — M810 Age-related osteoporosis without current pathological fracture: Secondary | ICD-10-CM

## 2020-10-04 ENCOUNTER — Telehealth: Payer: Self-pay | Admitting: *Deleted

## 2020-10-04 ENCOUNTER — Telehealth: Payer: PPO | Admitting: *Deleted

## 2020-10-04 NOTE — Telephone Encounter (Signed)
  Chronic Care Management   Outreach Note  10/04/2020 Name: Alicia Russo MRN: [id number] DOB: 1956-08-21  Referred by: Raliegh Ip, DO Reason for referral : Chronic Care Management (RN follow up)   An unsuccessful telephone follow-up was attempted today. The patient was referred to the case management team for assistance with care management and care coordination.   Follow Up Plan: A HIPAA compliant phone message was left for the patient providing contact information and requesting a return call.  The care management team will reach out to the patient again over the next 30 days.   Demetrios Loll, BSN, RN-BC Embedded Chronic Care Manager [facility] Care Management Direct Dial: [phone]

## 2020-10-09 ENCOUNTER — Other Ambulatory Visit: Payer: Self-pay

## 2020-10-09 ENCOUNTER — Ambulatory Visit (INDEPENDENT_AMBULATORY_CARE_PROVIDER_SITE_OTHER): Payer: PPO

## 2020-10-09 DIAGNOSIS — Z780 Asymptomatic menopausal state: Secondary | ICD-10-CM | POA: Diagnosis not present

## 2020-10-09 DIAGNOSIS — M810 Age-related osteoporosis without current pathological fracture: Secondary | ICD-10-CM

## 2020-10-11 ENCOUNTER — Other Ambulatory Visit: Payer: Self-pay

## 2020-10-11 ENCOUNTER — Ambulatory Visit (INDEPENDENT_AMBULATORY_CARE_PROVIDER_SITE_OTHER): Payer: PPO | Admitting: Pharmacist

## 2020-10-11 DIAGNOSIS — M810 Age-related osteoporosis without current pathological fracture: Secondary | ICD-10-CM | POA: Diagnosis not present

## 2020-10-11 MED ORDER — DENOSUMAB 60 MG/ML ~~LOC~~ SOSY: 60.0000 mg | PREFILLED_SYRINGE | Freq: Once | SUBCUTANEOUS | 0 refills | Status: DC

## 2020-10-11 NOTE — Progress Notes (Signed)
    10/11/2020 Name: Alicia Russo MRN: [id number] DOB: 31-Oct-1956   S:  64 yoF Presents for osteoporosis education, and management Postmenopausal. History of left forearm/wrist fracture. History of hypothyroidism.  Current Height:  5'1"      Max Lifetime Height:  5'1" Current Weight:   204 lb      Ethnicity:Caucasian  BP:   119/62     HPI: Does pt already have a diagnosis of:  Osteopenia?  Yes Osteoporosis?  Yes  Back Pain?  Yes       Kyphosis?  No Prior fracture?  Yes  - left wrist 03/2017; also broke left arm 2011 Med(s) for Osteoporosis/Osteopenia:  Calcium/vitD Med(s) previously tried for Osteoporosis/Osteopenia:  Forteo (concerned about bone cancer risk/cost/side effects), fosamax (tried and failed due to GERD, N/V)                                                             PMH: Age at menopause:  ~64 yo Hysterectomy?  No Oophorectomy?  No HRT? No Steroid Use?  Yes - Current.  Type/duration: only 5 day course Thyroid med?  Yes History of cancer?  No History of digestive disorders (ie Crohn's)?  Yes - GERD Current or previous eating disorders?  No Last Vitamin D Result:  67.3 (05/26/17) Last GFR Result:  97 (09/17/20)   FH/SH: Family history of osteoporosis?  Yes  - mother, takes alendroante Parent with history of hip fracture?  No Family history of breast cancer?  No Exercise?  No - not currently Smoking?  No Alcohol?  No    Calcium Assessment Calcium Intake  # of servings/day  Calcium mg  Milk (8 oz) 1  x  300  = 300  Yogurt (4 oz) 1 x  200 = 200  Cheese (1 oz) 1 x  200 = 200  Other Calcium sources   250mg   Ca supplement 0 = 0   Estimated calcium intake per day 950    DEXA Results/Assessment: FINDINGS:  AP LUMBAR SPINE L2 through L4 Bone Mineral Density (BMD):  0.877 g/cm2 Young Adult T-Score:  -2.7  LEFT FEMUR NECK Bone Mineral Density (BMD):  0.559 g/cm2 Young Adult T-Score: -3.4  ASSESSMENT: Patient's diagnostic category is OSTEOPOROSIS by  WHO Criteria.  FRACTURE RISK: INCREASED  FRAX: World Health Organization FRAX assessment of absolute fracture risk is not calculated for this patient because the patient has Osteoporosis.  COMPARISON: 05/11/2017. Since the prior study there has been a 3.3% decrease in density for the lumbar spine and a 6.3% decrease in density for the total mean proximal femurs.  Recommendations: 1.  Start Prolia; will explore benefits,cost, etc 2.  recommend calcium 1200mg  daily through supplementation or diet.  3.  continue weight bearing exercise - 30 minutes at least 4 days per week.   4.  Counseled and educated about fall risk and prevention.  Recheck DEXA:  2 years  Time spent counseling patient:  30 minutes   05/13/2017, PharmD, BCPS Clinical Pharmacist, [facility]  II Phone [phone]

## 2020-10-16 NOTE — Telephone Encounter (Signed)
Pt has been r/s  

## 2020-10-21 MED ORDER — DENOSUMAB 60 MG/ML ~~LOC~~ SOSY: 60.0000 mg | PREFILLED_SYRINGE | Freq: Once | SUBCUTANEOUS | 2 refills | Status: AC

## 2020-10-31 ENCOUNTER — Other Ambulatory Visit: Payer: Self-pay | Admitting: *Deleted

## 2020-10-31 MED ORDER — MONTELUKAST SODIUM 10 MG PO TABS
10.0000 mg | ORAL_TABLET | Freq: Every evening | ORAL | 3 refills | Status: DC
Start: 2020-10-31 — End: 2021-11-19

## 2020-11-14 ENCOUNTER — Ambulatory Visit: Payer: PPO | Admitting: *Deleted

## 2020-11-14 DIAGNOSIS — M797 Fibromyalgia: Secondary | ICD-10-CM

## 2020-11-14 DIAGNOSIS — I10 Essential (primary) hypertension: Secondary | ICD-10-CM

## 2020-11-14 NOTE — Chronic Care Management (AMB) (Signed)
Chronic Care Management   Follow Up Note   11/14/2020 Name: Alicia Russo MRN: [id number] DOB: 28-Nov-1956  Referred by: Raliegh Ip, DO Reason for referral : Chronic Care Management (RN follow up)   Alicia Russo is a 64 y.o. year old female who is a primary care patient of Raliegh Ip, DO. The CCM team was consulted for assistance with chronic disease management and care coordination needs.    Review of patient status, including review of consultants reports, relevant laboratory and other test results, and collaboration with appropriate care team members and the patient's provider was performed as part of comprehensive patient evaluation and provision of chronic care management services.    SDOH (Social Determinants of Health) assessments performed: No See Care Plan activities for detailed interventions related to [facility])     Outpatient Encounter Medications as of 11/14/2020  Medication Sig  . albuterol (PROVENTIL) (2.5 MG/3ML) 0.083% nebulizer solution USE ONE vial in nebulizer EVERY 6 HOURS AS NEEDED wheezing SHORTNESS OF BREATH  . albuterol (VENTOLIN HFA) 108 (90 Base) MCG/ACT inhaler INHALE 2 PUFFS INTO THE LUNGS EVERY 6 HOURS AS NEEDED FOR WHEEZING OR SHORTNESS OF BREATH.  Marland Kitchen ALPRAZolam (XANAX) 0.5 MG tablet Take 0.5-1 tablets (0.25-0.5 mg total) by mouth 2 (two) times daily as needed for anxiety.  Marland Kitchen amoxicillin-clavulanate (AUGMENTIN) 875-125 MG tablet Take 1 tablet by mouth 2 (two) times daily.  Marland Kitchen aspirin 81 MG EC tablet Take 81 mg by mouth 2 (two) times daily.   . Biotin 10 MG TABS Take 1 tablet by mouth 2 (two) times daily.   . cetirizine (ZYRTEC) 10 MG tablet Take 1 tablet (10 mg total) by mouth daily.  . Cholecalciferol (VITAMIN D3) 2000 UNITS TABS Take 1 tablet by mouth 2 (two) times daily.  . Cyanocobalamin (VITAMIN B-12 PO) Take 1 tablet by mouth 2 (two) times daily.  Marland Kitchen ezetimibe (ZETIA) 10 MG tablet Take 1 tablet (10 mg total) by mouth daily.  Marland Kitchen ibuprofen  (ADVIL) 800 MG tablet Take 1 tablet (800 mg total) by mouth every 8 (eight) hours as needed for headache or moderate pain.  Marland Kitchen levothyroxine (SYNTHROID) 137 MCG tablet Take 1 tablet (137 mcg total) by mouth daily.  Marland Kitchen losartan (COZAAR) 50 MG tablet Take 1 tablet (50 mg total) by mouth daily.  . magnesium 30 MG tablet Take 30 mg by mouth 2 (two) times daily.  . methocarbamol (ROBAXIN) 500 MG tablet TAKE 1 TABLET BY MOUTH EVERY 6 HOURS AS NEEDED FOR MUSCLE SPASMS  . Milnacipran (SAVELLA) 50 MG TABS tablet Take 1 tablet (50 mg total) by mouth 2 (two) times daily.  . montelukast (SINGULAIR) 10 MG tablet Take 1 tablet (10 mg total) by mouth every evening.  Marland Kitchen omeprazole (PRILOSEC) 20 MG capsule Take 1 capsule (20 mg total) by mouth daily.  . ondansetron (ZOFRAN) 4 MG tablet Take 1 tablet (4 mg total) by mouth every 8 (eight) hours as needed for nausea or vomiting.  . Rimegepant Sulfate (NURTEC) 75 MG TBDP Take 1 tablet by mouth daily as needed (migraine).   No facility-administered encounter medications on file as of 11/14/2020.     Goals Addressed            This Visit's Progress   . Chronic Disease Management Needs       CARE PLAN ENTRY (see longtitudinal plan of care for additional care plan information)  Current Barriers:  . 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 Work with the care management team to address educational, disease management, and care coordination needs  . Begin or continue self health monitoring activities as directed today Measure and record blood pressure 3 times per  week . Call provider office for new or worsened signs and symptoms Blood pressure findings outside established parameters . 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 Collaborated with appropriate clinical care team members regarding patient needs . Previously discussed social support o 2 children, 4 grandchildren, mother, and neighbors . 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 Previously discussed ability to perform ADLs . Previously discussed mobility and physical activity level . 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 Manage Pain       Patient Activities: . plan exercise or activity when pain is best controlled . track times pain is worst and when it is best . use ice or heat for pain relief . Work with PCP and CCM team to find a medication that works well for you    Why is this important?    Day-to-day life can be hard when you have chronic pain.   Pain medicine is just one piece of the treatment puzzle.   You can try these action steps to help you manage your pain.          Patient Care Plan: Chronic Pain-Fibromyalgia  Problem Identified: Chronic Pain Management      Goal: Medication  Management   This Visit's Progress: Not on track  Priority: High  Note:   Current Barriers:  . Savella (medication) for fibromyalgia is causing nausea, vomiting, and abdominal pain. Patient has talked with PCP and dosage has been cut in half, but she is still experiencing N/V and abdominal pain.   Nurse Case Manager Clinical Goal(s):  Marland Kitchen Over the next 7 days, patient will work with PCP and CCM Team to address needs related to fibromyalgia pain management.   Interventions:  . Inter-disciplinary care team collaboration (see longitudinal plan of care) . Evaluation of current treatment plan related to fibromyalgia and patient's adherence to plan as established by provider. . Reviewed medications with patient and discussed Savella . Encouraged patient to take medication with food o Patient experiences N/V and abd pain even when taken with food . Discussed how well this medication has managed her fibromyalgia symptoms and that she did not have any side effects until recently  . Discussed plans with patient for ongoing care management follow up and provided  patient with direct contact information for care management team . RN Care Manager will collaborate with Dr Nadine Counts and Vanice Sarah, PharmD regarding Amada Jupiter side effects and treatment options   Patient Self Care Activities:   Self administers medications as prescribed  call pharmacy for medication refills  Call provider office for new concerns or questions . plan exercise or activity when pain is best controlled . track times pain is worst and when it is best . use ice or heat for pain relief . Work with PCP and CCM team to find a medication that works well for you   Initial goal documentation        Plan:  The care management team will reach out to the patient again over the next 5 days.    Demetrios Loll, BSN, RN-BC Embedded Chronic Care Manager [facility] Care Management Direct Dial:  [phone]

## 2020-11-14 NOTE — Patient Instructions (Signed)
Visit Information  Goals Addressed            This Visit's Progress   . Chronic Disease Management Needs       CARE PLAN ENTRY (see longtitudinal plan of care for additional care plan information)  Current Barriers:  . Chronic Disease Management support, education, and care coordination needs related to HTN, asthma, GERD, hypothyroidism, HLD, migraine, osteoporosis, fibromyalgia, memory problems (s/p brain aneurysm surgery and seizure 20 years ago)  Clinical Goal(s) related to HTN, asthma, GERD, hypothyroidism, HLD, migraine, osteoporosis, fibromyalgia, memory problems (s/p brain aneurysm surgery and seizure 20 years ago):  Over the next 60 days, patient will:  . Work with the care management team to address educational, disease management, and care coordination needs  . Begin or continue self health monitoring activities as directed today Measure and record blood pressure 3 times per week . Call provider office for new or worsened signs and symptoms Blood pressure findings outside established parameters . 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 Collaborated with appropriate clinical care team members regarding patient needs . Previously discussed social support o 2 children, 4 grandchildren, mother, and neighbors . 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 Previously discussed ability to perform ADLs . Previously discussed mobility and physical activity level . 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 Manage Pain       Patient Activities: . plan exercise or activity when pain is best controlled . track times pain is worst and when it is best . use ice or heat for pain relief . Work with PCP and CCM team to find a medication that works well for you    Why is this important?    Day-to-day life can be hard when you have chronic pain.   Pain medicine is just one piece of the treatment puzzle.   You can try these action steps to help you manage your pain.          The patient verbalized understanding of instructions, educational materials, and care plan provided today and declined offer to receive copy of patient instructions, educational materials, and care plan.   Follow-up Plan The patient has been provided with contact information for the care management team and has been advised to call with any health related questions or concerns.  The care management team will reach out to the patient again over the next 5 days.   Demetrios Loll, BSN, RN-BC Embedded Chronic Care Manager [facility] / [facility] Care Management Direct Dial: [phone]

## 2020-12-04 ENCOUNTER — Other Ambulatory Visit: Payer: Self-pay | Admitting: Family Medicine

## 2020-12-04 DIAGNOSIS — I10 Essential (primary) hypertension: Secondary | ICD-10-CM

## 2020-12-16 ENCOUNTER — Telehealth: Payer: PPO | Admitting: Family

## 2020-12-16 DIAGNOSIS — Z20822 Contact with and (suspected) exposure to covid-19: Secondary | ICD-10-CM

## 2020-12-16 MED ORDER — ALBUTEROL SULFATE HFA 108 (90 BASE) MCG/ACT IN AERS: 2.0000 | INHALATION_SPRAY | Freq: Four times a day (QID) | RESPIRATORY_TRACT | 0 refills | Status: DC | PRN

## 2020-12-16 MED ORDER — BENZONATATE 100 MG PO CAPS: 100.0000 mg | ORAL_CAPSULE | Freq: Three times a day (TID) | ORAL | 0 refills | Status: DC | PRN

## 2020-12-16 NOTE — Progress Notes (Signed)
E-Visit for Corona Virus Screening  Your current symptoms could be consistent with the coronavirus.  Many health care providers can now test patients at their office but not all are.  [facility] has multiple testing sites. For information on our COVID testing locations and hours go to [email or web address]  We are enrolling you in our MyChart Home Monitoring for COVID19 . Daily you will receive a questionnaire within the MyChart website. Our COVID 19 response team will be monitoring your responses daily.  Testing Information: The COVID-19 Community Testing sites are testing BY APPOINTMENT ONLY.  You can schedule online at [email or web address]  If you do not have access to a smart phone or computer you may call [phone] for an appointment.   Additional testing sites in the Community:  . For [facility] Testing sites in [place]  https://www.cvs.com/minuteclinic/covid-19-testing  . For Pop-up testing sites in [place]  https://covid19.ncdhhs.gov/about-covid-19/testing/find-my-testing-place/pop-testing-sites  . For [facility] https://[place].gov[place]/human-services/health-department/coronavirus-covid-19-info/covid-19-testing  . For [place] testing in [place] and [place] https://[place].gov[place]/human-services/health-department/coronavirus-covid-19-info/covid-19-testing  . For [facility] testing in [place]   https://[facility].care/covidtesting  For  more information about community testing call [phone]   Please quarantine yourself while awaiting your test results. Please stay home for a minimum of 10 days from the first day of illness with improving symptoms and you have had 24 hours of no fever (without the use of Tylenol (Acetaminophen) Motrin (Ibuprofen) or any fever reducing medication).  Also - Do not get tested prior to returning to work because once you have had a positive test the test can stay  positive for more than a month in some cases.   You should wear a mask or cloth face covering over your nose and mouth if you must be around other people or animals, including pets (even at home). Try to stay at least 6 feet away from other people. This will protect the people around you.  Please continue good preventive care measures, including:  frequent hand-washing, avoid touching your face, cover coughs/sneezes, stay out of crowds and keep a 6 foot distance from others.  COVID-19 is a respiratory illness with symptoms that are similar to the flu. Symptoms are typically mild to moderate, but there have been cases of severe illness and death due to the virus.   The following symptoms may appear 2-14 days after exposure: . Fever . Cough . Shortness of breath or difficulty breathing . Chills . Repeated shaking with chills . Muscle pain . Headache . Sore throat . New loss of taste or smell . Fatigue . Congestion or runny nose . Nausea or vomiting . Diarrhea  Go to the nearest hospital ED for assessment if fever/cough/breathlessness are severe or illness seems like a threat to life.  It is vitally important that if you feel that you have an infection such as this virus or any other virus that you stay home and away from places where you may spread it to others.  You should avoid contact with people age 65 and older.   You can use medication such as A prescription cough medication called Tessalon Perles 100 mg. You may take 1-2 capsules every 8 hours as needed for cough and A prescription inhaler called Albuterol MDI 90 mcg /actuation 2 puffs every 4 hours as needed for shortness of breath, wheezing, cough  You may also take acetaminophen (Tylenol) as needed for fever.  Reduce your risk of any infection by using the same precautions used for avoiding the common cold or flu:  .   Wash your hands often with soap and warm water for at least 20 seconds.  If soap and water are not readily available,  use an alcohol-based hand sanitizer with at least 60% alcohol.  . If coughing or sneezing, cover your mouth and nose by coughing or sneezing into the elbow areas of your shirt or coat, into a tissue or into your sleeve (not your hands). . Avoid shaking hands with others and consider head nods or verbal greetings only. . Avoid touching your eyes, nose, or mouth with unwashed hands.  . Avoid close contact with people who are sick. . Avoid places or events with large numbers of people in one location, like concerts or sporting events. . Carefully consider travel plans you have or are making. . If you are planning any travel outside or inside the [place], visit the CDC's Travelers' Health webpage for the latest health notices. . If you have some symptoms but not all symptoms, continue to monitor at home and seek medical attention if your symptoms worsen. . If you are having a medical emergency, call 911.  HOME CARE . Only take medications as instructed by your medical team. . Drink plenty of fluids and get plenty of rest. . A steam or ultrasonic humidifier can help if you have congestion.   GET HELP RIGHT AWAY IF YOU HAVE EMERGENCY WARNING SIGNS** FOR COVID-19. If you or someone is showing any of these signs seek emergency medical care immediately. Call 911 or proceed to your closest emergency facility if: . You develop worsening high fever. . Trouble breathing . Bluish lips or face . Persistent pain or pressure in the chest . New confusion . Inability to wake or stay awake . You cough up blood. . Your symptoms become more severe  **This list is not all possible symptoms. Contact your medical provider for any symptoms that are sever or concerning to you.  MAKE SURE YOU   Understand these instructions.  Will watch your condition.  Will get help right away if you are not doing well or get worse.  Your e-visit answers were reviewed by a board certified advanced clinical practitioner to  complete your personal care plan.  Depending on the condition, your plan could have included both over the counter or prescription medications.  If there is a problem please reply once you have received a response from your provider.  Your safety is important to us.  If you have drug allergies check your prescription carefully.    You can use MyChart to ask questions about today's visit, request a non-urgent call back, or ask for a work or school excuse for 24 hours related to this e-Visit. If it has been greater than 24 hours you will need to follow up with your provider, or enter a new e-Visit to address those concerns. You will get an e-mail in the next two days asking about your experience.  I hope that your e-visit has been valuable and will speed your recovery. Thank you for using e-visits.   Approximately 5 minutes was spent documenting and reviewing patient's chart.  

## 2020-12-17 DIAGNOSIS — Z20822 Contact with and (suspected) exposure to covid-19: Secondary | ICD-10-CM | POA: Diagnosis not present

## 2020-12-19 DIAGNOSIS — Z20822 Contact with and (suspected) exposure to covid-19: Secondary | ICD-10-CM | POA: Diagnosis not present

## 2020-12-20 ENCOUNTER — Encounter (INDEPENDENT_AMBULATORY_CARE_PROVIDER_SITE_OTHER): Payer: Self-pay

## 2020-12-20 ENCOUNTER — Telehealth: Payer: Self-pay

## 2020-12-20 ENCOUNTER — Telehealth: Payer: PPO | Admitting: Family

## 2020-12-20 DIAGNOSIS — B9689 Other specified bacterial agents as the cause of diseases classified elsewhere: Secondary | ICD-10-CM

## 2020-12-20 DIAGNOSIS — J329 Chronic sinusitis, unspecified: Secondary | ICD-10-CM

## 2020-12-20 MED ORDER — AMOXICILLIN-POT CLAVULANATE 875-125 MG PO TABS: 1.0000 | ORAL_TABLET | Freq: Two times a day (BID) | ORAL | 0 refills | Status: DC

## 2020-12-20 NOTE — Telephone Encounter (Signed)
Called patient for Best practice for worsening lack of apitite. She states she she is tired and not interested in eating.  She is drinking adequate.  Temp 99 range. She states that she is nauseated but not vomiting. S/S of dehydration reviewed She was instructed to continue liquids and gradually add solid foods such  As crackers pretzels applesauce soup and boiled starches.  She states she has not received her COVID-19 test result but was exposed.  She has signed up for rapid test today.  She will reach out to PCP if vomiting develops.  She verbalized understanding

## 2020-12-20 NOTE — Progress Notes (Signed)

## 2020-12-21 ENCOUNTER — Encounter (INDEPENDENT_AMBULATORY_CARE_PROVIDER_SITE_OTHER): Payer: Self-pay

## 2020-12-21 ENCOUNTER — Telehealth: Payer: Self-pay

## 2020-12-21 NOTE — Telephone Encounter (Signed)
Coughing most of night with very little sleep. Cough is productive and thick and hard to cough up.  SOB with activity. Ribs are sore today- used MDI with relief and took tessalon perle's without effect. PND as well. Nasal congestion. After using saline rinse,right ear began to "burn" and pt applied a warm washcloth. No drainage noted. No issues with ears today except for itching out right ear. Has seen ENT last year d/t frequent sinus infections. Has a humidifier in bedroom. SpO2 92% during coughing episodes-97% without coughing and at rest.  . If cough remains the same or better: continue to treat with over the counter medications.  Hard candy or cough drops and drinking warm fluids. Adults can also use honey 2 tsp (10 ML) at bedtime.  Marland Kitchen Honey is not recommended for infants under one.  If cough is becoming worse even with the use of over the counter medications and patient is not able to sleep at night, cough becomes productive with sputum that maybe yellow or green in color, contact PCP.   Pt verbalized understanding.

## 2020-12-24 ENCOUNTER — Encounter (INDEPENDENT_AMBULATORY_CARE_PROVIDER_SITE_OTHER): Payer: Self-pay

## 2020-12-24 ENCOUNTER — Telehealth: Payer: Self-pay

## 2020-12-24 NOTE — Telephone Encounter (Addendum)
Diarrhea began today - is on abx and probiotic Augmentin. Has h/o C-Diff 4 years ago.  4 extra stools watery. Mild cramping before BM but is relieved by BM. No fever.  No blood in diarrhea. Is drinking fluid (V8)- . If diarrhea remains the same:  encourage patient to drink oral fluids and bland foods.  . Avoid alcohol, spicy foods, caffeine or fatty foods that could make diarrhea worse.  . Continue to monitor for signs of dehydration (increased thirst decreased urine output, yellow urine, dry skin, headache or dizziness).  . Advise patient to try OTC medication (Imodium, kaopectate, Pepto-Bismol) as per manufacturer's instructions. . If worsening diarrhea occurs and becomes severe (6-7 bowel movements a day): notify PCP  . If diarrhea last greater than 7 days: notify PCP . If signs of dehydration occur (increased thirst, decreased urine output, yellow urine, dry skin, headache or dizziness) advise patient to call 911 and seek treatment in the ED

## 2020-12-26 ENCOUNTER — Telehealth: Payer: Self-pay

## 2020-12-26 ENCOUNTER — Encounter (INDEPENDENT_AMBULATORY_CARE_PROVIDER_SITE_OTHER): Payer: Self-pay

## 2020-12-26 NOTE — Telephone Encounter (Signed)
Patient states that the diarrhea is much better today and that she thinks it's from the antibiotic. Patient has been advise on diarrhea per protocol. Patient states that she is not vomiting at all.     If diarrhea remains the same: encourage patient to drink oral fluids and bland foods.   Avoid alcohol, spicy foods, caffeine or fatty foods that could make diarrhea worse.   Continue to monitor for signs of dehydration (increased thirst decreased urine output, yellow urine, dry skin, headache or dizziness).   Advise patient to try OTC medication (Imodium, kaopectate, Pepto-Bismol) as per manufacturer's instructions.   If worsening diarrhea occurs and becomes severe (6-7 bowel movements a day): notify PCP   If diarrhea last greater than 7 days: notify PCP   IF SIGNS OF DEHYDRATION OCCUR (INCREASED THIRST, DECREASED URINE OUTPUT, YELLOW URINE, DRY SKIN, HEADACHE OR DIZZINESS) ADVISE PATIENT TO CALL 911 AND SEEK TREATMENT IN THE ED

## 2020-12-27 DIAGNOSIS — Z029 Encounter for administrative examinations, unspecified: Secondary | ICD-10-CM

## 2020-12-31 ENCOUNTER — Other Ambulatory Visit: Payer: Self-pay | Admitting: Family Medicine

## 2021-01-02 DIAGNOSIS — Z1231 Encounter for screening mammogram for malignant neoplasm of breast: Secondary | ICD-10-CM | POA: Diagnosis not present

## 2021-02-02 ENCOUNTER — Other Ambulatory Visit: Payer: Self-pay | Admitting: Family Medicine

## 2021-02-04 ENCOUNTER — Other Ambulatory Visit: Payer: Self-pay

## 2021-02-04 ENCOUNTER — Ambulatory Visit (INDEPENDENT_AMBULATORY_CARE_PROVIDER_SITE_OTHER): Payer: PPO

## 2021-02-04 DIAGNOSIS — M810 Age-related osteoporosis without current pathological fracture: Secondary | ICD-10-CM

## 2021-02-04 MED ORDER — DENOSUMAB 60 MG/ML ~~LOC~~ SOSY
60.0000 mg | PREFILLED_SYRINGE | Freq: Once | SUBCUTANEOUS | Status: AC
  Administered 2021-02-04: 60 mg via SUBCUTANEOUS

## 2021-02-04 NOTE — Progress Notes (Signed)
Pt tolerated first Prolia injection well. Went over side effects with patient and when she would need to call if there was a concern. Date given written in Prolia notebook at nurses station.

## 2021-03-10 ENCOUNTER — Ambulatory Visit: Payer: PPO | Admitting: Family Medicine

## 2021-03-12 ENCOUNTER — Ambulatory Visit: Payer: PPO | Admitting: Family Medicine

## 2021-03-17 ENCOUNTER — Ambulatory Visit (INDEPENDENT_AMBULATORY_CARE_PROVIDER_SITE_OTHER): Payer: PPO | Admitting: Family Medicine

## 2021-03-17 ENCOUNTER — Other Ambulatory Visit: Payer: Self-pay

## 2021-03-17 ENCOUNTER — Encounter: Payer: Self-pay | Admitting: Family Medicine

## 2021-03-17 VITALS — BP 174/91 | HR 71 | Temp 97.6°F | Ht 60.0 in | Wt 201.0 lb

## 2021-03-17 DIAGNOSIS — R21 Rash and other nonspecific skin eruption: Secondary | ICD-10-CM

## 2021-03-17 DIAGNOSIS — J4521 Mild intermittent asthma with (acute) exacerbation: Secondary | ICD-10-CM | POA: Diagnosis not present

## 2021-03-17 DIAGNOSIS — H6991 Unspecified Eustachian tube disorder, right ear: Secondary | ICD-10-CM

## 2021-03-17 DIAGNOSIS — E034 Atrophy of thyroid (acquired): Secondary | ICD-10-CM

## 2021-03-17 DIAGNOSIS — M810 Age-related osteoporosis without current pathological fracture: Secondary | ICD-10-CM

## 2021-03-17 DIAGNOSIS — F410 Panic disorder [episodic paroxysmal anxiety] without agoraphobia: Secondary | ICD-10-CM | POA: Diagnosis not present

## 2021-03-17 DIAGNOSIS — I10 Essential (primary) hypertension: Secondary | ICD-10-CM

## 2021-03-17 DIAGNOSIS — F411 Generalized anxiety disorder: Secondary | ICD-10-CM

## 2021-03-17 DIAGNOSIS — G43809 Other migraine, not intractable, without status migrainosus: Secondary | ICD-10-CM

## 2021-03-17 DIAGNOSIS — M797 Fibromyalgia: Secondary | ICD-10-CM | POA: Diagnosis not present

## 2021-03-17 MED ORDER — NURTEC 75 MG PO TBDP: 1.0000 | ORAL_TABLET | ORAL | 99 refills | Status: DC

## 2021-03-17 MED ORDER — PREDNISONE 10 MG (21) PO TBPK: ORAL_TABLET | ORAL | 0 refills | Status: DC

## 2021-03-17 MED ORDER — ALPRAZOLAM 0.5 MG PO TABS: 0.2500 mg | ORAL_TABLET | Freq: Two times a day (BID) | ORAL | 0 refills | Status: DC | PRN

## 2021-03-17 MED ORDER — BETAMETHASONE VALERATE 0.1 % EX OINT: 1.0000 "application " | TOPICAL_OINTMENT | Freq: Two times a day (BID) | CUTANEOUS | 0 refills | Status: DC

## 2021-03-17 MED ORDER — NURTEC 75 MG PO TBDP
1.0000 | ORAL_TABLET | ORAL | 99 refills | Status: DC
Start: 2021-03-17 — End: 2022-03-16

## 2021-03-17 NOTE — Progress Notes (Signed)
Subjective: CC: Rash, hypothyroidism, fibromyalgia, migraine headache asthma PCP: Janora Norlander, DO QNV:Alicia Russo is a 65 y.o. female presenting to clinic today for:  1.  Rash Patient reports that she is had a rash on the right buttock for a couple of months now.  She had similar just superior of this but this had resolved.  The lesion that is currently of concern has been treated with various over-the-counter creams but nothing has really helped.  Does not report any drainage or pain.  She reports diffuse itching despite use of Zyrtec and Singulair  2.  Asthma exacerbation Patient reports increased wheezing and sensation of shortness of breath since change in weather.  She is been using her albuterol both nebulizer and inhaler for flares with last use yesterday.  She is compliant with Zyrtec and Singulair.  Not currently on any controller inhalers but does have a few at home.  She thinks that she has Advair and may be Symbicort.  She is not used in some time  3.  Fibromyalgia Patient is having a fibromyalgia flare in her lower extremities.  This kept her up last night.  She uses all of her medications as prescribed.  She is ambulating independently but does note that pain was pretty extreme yesterday evening and this is why she thinks her blood pressure is elevated today  4.  Migraine headaches Patient reports varying response to her Nurtec.  Sometimes it seems to help but it really depends on where the headache is occurring.  She does have headaches posteriorly that sometimes cause her to be nauseated.  She does use Motrin intermittently as needed headaches as well with moderate relief.  She does report some right ear pain would like me to have a look at this today.  No fevers.  Pain is intermittent but sharp.  5.  Hypothyroidism Patient is compliant with Synthroid.  She does not report any change in voice, difficulty swallowing, tremor or heart palpitations.  She would like a  90-day supply once this is refilled.  6.  Osteoporosis Patient is compliant with vitamin D.  She had her first Prolia injection done in February and seemed to do well off of this.  She does not report any atypical bone pain or other concerns.  7.  Anxiety attacks Patient reports rare anxiety attacks.  Her current Xanax is expired and she is asking for replacement.  Denies any excessive daytime sedation, falls, respiratory depression, visual or auditory hallucinations  ROS: Per HPI  Allergies  Allergen Reactions  . Bee Venom     Cant breath, swell up  . Cherry Flavor Hives      dizziness  . Citalopram Hydrobromide     Mood swings, gets "really really mean"  . Statins     Myalgia and muscle pain  . Tramadol     unknown   Past Medical History:  Diagnosis Date  . Anxiety   . Asthma   . AVM (arteriovenous malformation)    Intracranial with seizures  . Closed fracture of left distal radius   . Fibromyalgia   . GERD (gastroesophageal reflux disease)   . Hyperlipidemia   . Hypothyroidism   . Plantar fasciitis of right foot   . Seizures ([place])    AVM repair    Current Outpatient Medications:  .  albuterol (PROVENTIL) (2.5 MG/3ML) 0.083% nebulizer solution, USE ONE vial in nebulizer EVERY 6 HOURS AS NEEDED wheezing SHORTNESS OF BREATH, Disp: 240 mL, Rfl: 5 .  albuterol (VENTOLIN HFA) 108 (90 Base) MCG/ACT inhaler, Inhale 2 puffs into the lungs every 6 (six) hours as needed for wheezing or shortness of breath., Disp: 8 g, Rfl: 0 .  ALPRAZolam (XANAX) 0.5 MG tablet, Take 0.5-1 tablets (0.25-0.5 mg total) by mouth 2 (two) times daily as needed for anxiety., Disp: 40 tablet, Rfl: 0 .  aspirin 81 MG EC tablet, Take 81 mg by mouth 2 (two) times daily. , Disp: , Rfl:  .  Biotin 10 MG TABS, Take 1 tablet by mouth 2 (two) times daily. , Disp: , Rfl:  .  cetirizine (ZYRTEC) 10 MG tablet, Take 1 tablet (10 mg total) by mouth daily., Disp: 90 tablet, Rfl: 3 .  Cholecalciferol (VITAMIN D3) 2000  UNITS TABS, Take 1 tablet by mouth 2 (two) times daily., Disp: , Rfl:  .  Cyanocobalamin (VITAMIN B-12 PO), Take 1 tablet by mouth 2 (two) times daily., Disp: , Rfl:  .  ezetimibe (ZETIA) 10 MG tablet, TAKE 1 TABLET BY MOUTH EVERY DAY, Disp: 30 tablet, Rfl: 1 .  ibuprofen (ADVIL) 800 MG tablet, Take 1 tablet (800 mg total) by mouth every 8 (eight) hours as needed for headache or moderate pain., Disp: 90 tablet, Rfl: 0 .  levothyroxine (SYNTHROID) 137 MCG tablet, Take 1 tablet (137 mcg total) by mouth daily., Disp: 90 tablet, Rfl: 3 .  losartan (COZAAR) 50 MG tablet, TAKE 1 TABLET BY MOUTH DAILY, Disp: 90 tablet, Rfl: 1 .  magnesium 30 MG tablet, Take 30 mg by mouth 2 (two) times daily., Disp: , Rfl:  .  methocarbamol (ROBAXIN) 500 MG tablet, TAKE 1 TABLET BY MOUTH EVERY 6 HOURS AS NEEDED FOR MUSCLE SPASMS, Disp: 40 tablet, Rfl: 0 .  Milnacipran (SAVELLA) 50 MG TABS tablet, Take 1 tablet (50 mg total) by mouth 2 (two) times daily., Disp: 180 tablet, Rfl: 0 .  montelukast (SINGULAIR) 10 MG tablet, Take 1 tablet (10 mg total) by mouth every evening., Disp: 90 tablet, Rfl: 3 .  omeprazole (PRILOSEC) 20 MG capsule, Take 1 capsule (20 mg total) by mouth daily., Disp: 90 capsule, Rfl: 3 .  Rimegepant Sulfate (NURTEC) 75 MG TBDP, Take 1 tablet by mouth daily as needed (migraine)., Disp: 2 tablet, Rfl: 0 .  benzonatate (TESSALON PERLES) 100 MG capsule, Take 1 capsule (100 mg total) by mouth 3 (three) times daily as needed. (Patient not taking: Reported on 03/17/2021), Disp: 20 capsule, Rfl: 0 .  ondansetron (ZOFRAN) 4 MG tablet, Take 1 tablet (4 mg total) by mouth every 8 (eight) hours as needed for nausea or vomiting. (Patient not taking: Reported on 03/17/2021), Disp: 20 tablet, Rfl: 0 Social History   Socioeconomic History  . Marital status: Single    Spouse name: Not on file  . Number of children: 2  . Years of education: Not on file  . Highest education level: GED or equivalent  Occupational History   . Not on file  Tobacco Use  . Smoking status: Former Smoker    Years: 20.00    Quit date: 04/30/1999    Years since quitting: 21.8  . Smokeless tobacco: Never Used  Vaping Use  . Vaping Use: Never used  Substance and Sexual Activity  . Alcohol use: No    Alcohol/week: 0.0 standard drinks  . Drug use: No  . Sexual activity: Not on file  Other Topics Concern  . Not on file  Social History Narrative  . Not on file   Social Determinants of Health  Financial Resource Strain: Low Risk   . Difficulty of Paying Living Expenses: Not hard at all  Food Insecurity: No Food Insecurity  . Worried About Charity fundraiser in the Last Year: Never true  . Ran Out of Food in the Last Year: Never true  Transportation Needs: No Transportation Needs  . Lack of Transportation (Medical): No  . Lack of Transportation (Non-Medical): No  Physical Activity: Sufficiently Active  . Days of Exercise per Week: 6 days  . Minutes of Exercise per Session: 30 min  Stress: No Stress Concern Present  . Feeling of Stress : Only a little  Social Connections: Moderately Integrated  . Frequency of Communication with Friends and Family: More than three times a week  . Frequency of Social Gatherings with Friends and Family: More than three times a week  . Attends Religious Services: More than 4 times per year  . Active Member of Clubs or Organizations: Yes  . Attends Archivist Meetings: More than 4 times per year  . Marital Status: Divorced  Human resources officer Violence: Not on file   Family History  Problem Relation Age of Onset  . Emphysema Father        died at age 15  . COPD Father   . Hypertension Mother     Objective: Office vital signs reviewed. BP (!) 174/91   Pulse 71   Temp 97.6 F (36.4 C)   Ht 5' (1.524 m)   Wt 201 lb (91.2 kg)   SpO2 97%   BMI 39.26 kg/m   Physical Examination:  General: Awake, alert, well nourished, No acute distress HEENT: Normal; sclera white.  Moist  mucous membranes; TM with normal light reflex.  No appreciable effusions, erythema or bulging. Cardio: regular rate and rhythm, S1S2 heard, no murmurs appreciated Pulm: clear to auscultation bilaterally, no wheezes, rhonchi or rales; normal work of breathing on room air Extremities: warm, well perfused, No edema, cyanosis or clubbing; +2 pulses bilaterally MSK: normal gait and station Skin: dry; she has an area of skin thickening along the medial aspect of the right buttock.  There is a plaque-like change to this lesion.  No induration or fluctuance.  No appreciable punctum.  No associated erythema or warmth Neuro: No tremor Psych: Mood stable, speech normal, affect appropriate.  Does not appear to be responding to internal stimuli  Depression screen [facility] 2/9 03/17/2021 09/17/2020 03/18/2020  Decreased Interest 0 0 0  Down, Depressed, Hopeless 0 0 0  PHQ - 2 Score 0 0 0  Altered sleeping - 0 0  Tired, decreased energy - 0 0  Change in appetite - 0 0  Feeling bad or failure about yourself  - 0 0  Trouble concentrating - 0 0  Moving slowly or fidgety/restless - 0 0  Suicidal thoughts - 0 0  PHQ-9 Score - 0 0  Some recent data might be hidden   GAD 7 : Generalized Anxiety Score 03/18/2020  Nervous, Anxious, on Edge 1  Control/stop worrying 0  Worry too much - different things 0  Trouble relaxing 1  Restless 2  Easily annoyed or irritable 1  Afraid - awful might happen 0  Total GAD 7 Score 5  Anxiety Difficulty Not difficult at all    Assessment/ Plan: 65 y.o. female   Rash and nonspecific skin eruption - Plan: betamethasone valerate ointment (VALISONE) 0.1 %  Other migraine without status migrainosus, not intractable - Plan: Rimegepant Sulfate (NURTEC) 75 MG TBDP, DISCONTINUED: Rimegepant  Sulfate (NURTEC) 75 MG TBDP  Mild intermittent asthma with exacerbation - Plan: predniSONE (STERAPRED UNI-PAK 21 TAB) 10 MG (21) TBPK tablet  Generalized anxiety disorder with panic attacks - Plan:  ToxASSURE Select 13 (MW), Urine, ALPRAZolam (XANAX) 0.5 MG tablet  Essential hypertension  Fibromyalgia  Hypothyroidism due to acquired atrophy of thyroid - Plan: TSH, T4, free  Disorder of right eustachian tube  Age-related osteoporosis without current pathological fracture - Plan: VITAMIN D 25 Hydroxy (Vit-D Deficiency, Fractures), CMP14+EGFR  Betamethasone apply to the affected area twice daily if symptoms do not improve with oral prednisone for asthma  I have prescribed the Nurtec to be used every other day for prevention.  However, her insurance may not cover we may need to consider alternatives such as Roselyn Meier  Anxiety disorder is stable with as needed use of alprazolam.  She uses this extremely sparingly with last prescription April 2021.  CSC and UDS were obtained as per office policy.  Blood pressure is not at goal but may be due to the after mentioned issues.  I would recommend that she monitor blood pressures closely at home and if it continues to be above 140/90 we will plan to advance her losartan to 50 mg daily.  Check TSH, free T4.  She is asymptomatic from a thyroid standpoint  Continue Prolia.  Check calcium and vitamin D levels  I suspect that she has difficulty with eustachian tube drainage.  Hopefully the steroid will help.  Continue oral antihistamines.  Follow-up as needed as there is no evidence of infection  Orders Placed This Encounter  Procedures  . TSH  . T4, free  . VITAMIN D 25 Hydroxy (Vit-D Deficiency, Fractures)  . CMP14+EGFR  . ToxASSURE Select 13 (MW), Urine   No orders of the defined types were placed in this encounter.    Janora Norlander, DO [place] [phone]

## 2021-03-17 NOTE — Patient Instructions (Signed)
You had labs performed today.  You will be contacted with the results of the labs once they are available, usually in the next 3 business days for routine lab work.  If you have an active my chart account, they will be released to your MyChart.  If you prefer to have these labs released to you via telephone, please let us know.  If you had a pap smear or biopsy performed, expect to be contacted in about 7-10 days.   Check blood pressure at home.  Goal is <140/90.  If you continue to have high BPs, increase Losartan to 1 tablet daily.   Eustachian Tube Dysfunction  Eustachian tube dysfunction refers to a condition in which a blockage develops in the narrow passage that connects the middle ear to the back of the nose (eustachian tube). The eustachian tube regulates air pressure in the middle ear by letting air move between the ear and nose. It also helps to drain fluid from the middle ear space. Eustachian tube dysfunction can affect one or both ears. When the eustachian tube does not function properly, air pressure, fluid, or both can build up in the middle ear. What are the causes? This condition occurs when the eustachian tube becomes blocked or cannot open normally. Common causes of this condition include:  Ear infections.  Colds and other infections that affect the nose, mouth, and throat (upper respiratory tract).  Allergies.  Irritation from cigarette smoke.  Irritation from stomach acid coming up into the esophagus (gastroesophageal reflux). The esophagus is the tube that carries food from the mouth to the stomach.  Sudden changes in air pressure, such as from descending in an airplane or scuba diving.  Abnormal growths in the nose or throat, such as: ? Growths that line the nose (nasal polyps). ? Abnormal growth of cells (tumors). ? Enlarged tissue at the back of the throat (adenoids). What increases the risk? You are more likely to develop this condition if:  You  smoke.  You are overweight.  You are a child who has: ? Certain birth defects of the mouth, such as cleft palate. ? Large tonsils or adenoids. What are the signs or symptoms? Common symptoms of this condition include:  A feeling of fullness in the ear.  Ear pain.  Clicking or popping noises in the ear.  Ringing in the ear.  Hearing loss.  Loss of balance.  Dizziness. Symptoms may get worse when the air pressure around you changes, such as when you travel to an area of high elevation, fly on an airplane, or go scuba diving. How is this diagnosed? This condition may be diagnosed based on:  Your symptoms.  A physical exam of your ears, nose, and throat.  Tests, such as those that measure: ? The movement of your eardrum (tympanogram). ? Your hearing (audiometry). How is this treated? Treatment depends on the cause and severity of your condition.  In mild cases, you may relieve your symptoms by moving air into your ears. This is called "popping the ears."  In more severe cases, or if you have symptoms of fluid in your ears, treatment may include: ? Medicines to relieve congestion (decongestants). ? Medicines that treat allergies (antihistamines). ? Nasal sprays or ear drops that contain medicines that reduce swelling (steroids). ? A procedure to drain the fluid in your eardrum (myringotomy). In this procedure, a small tube is placed in the eardrum to:  Drain the fluid.  Restore the air in the middle ear space. ?  A procedure to insert a balloon device through the nose to inflate the opening of the eustachian tube (balloon dilation). Follow these instructions at home: Lifestyle  Do not do any of the following until your health care provider approves: ? Travel to high altitudes. ? Fly in airplanes. ? Work in a Estate agent or room. ? Scuba dive.  Do not use any products that contain nicotine or tobacco, such as cigarettes and e-cigarettes. If you need help  quitting, ask your health care provider.  Keep your ears dry. Wear fitted earplugs during showering and bathing. Dry your ears completely after. General instructions  Take over-the-counter and prescription medicines only as told by your health care provider.  Use techniques to help pop your ears as recommended by your health care provider. These may include: ? Chewing gum. ? Yawning. ? Frequent, forceful swallowing. ? Closing your mouth, holding your nose closed, and gently blowing as if you are trying to blow air out of your nose.  Keep all follow-up visits as told by your health care provider. This is important. Contact a health care provider if:  Your symptoms do not go away after treatment.  Your symptoms come back after treatment.  You are unable to pop your ears.  You have: ? A fever. ? Pain in your ear. ? Pain in your head or neck. ? Fluid draining from your ear.  Your hearing suddenly changes.  You become very dizzy.  You lose your balance. Summary  Eustachian tube dysfunction refers to a condition in which a blockage develops in the eustachian tube.  It can be caused by ear infections, allergies, inhaled irritants, or abnormal growths in the nose or throat.  Symptoms include ear pain, hearing loss, or ringing in the ears.  Mild cases are treated with maneuvers to unblock the ears, such as yawning or ear popping.  Severe cases are treated with medicines. Surgery may also be done (rare). This information is not intended to replace advice given to you by your health care provider. Make sure you discuss any questions you have with your health care provider. Document Revised: 03/22/2018 Document Reviewed: 03/22/2018 Elsevier Patient Education  2021 [employer].

## 2021-03-18 ENCOUNTER — Other Ambulatory Visit: Payer: Self-pay | Admitting: Family Medicine

## 2021-03-18 DIAGNOSIS — E039 Hypothyroidism, unspecified: Secondary | ICD-10-CM

## 2021-03-18 LAB — CMP14+EGFR
ALT: 16 IU/L (ref 0–32)
AST: 18 IU/L (ref 0–40)
Albumin/Globulin Ratio: 2 (ref 1.2–2.2)
Albumin: 4.1 g/dL (ref 3.8–4.8)
Alkaline Phosphatase: 83 IU/L (ref 44–121)
BUN/Creatinine Ratio: 11 — ABNORMAL LOW (ref 12–28)
BUN: 9 mg/dL (ref 8–27)
Bilirubin Total: 0.3 mg/dL (ref 0.0–1.2)
CO2: 25 mmol/L (ref 20–29)
Calcium: 8.8 mg/dL (ref 8.7–10.3)
Chloride: 99 mmol/L (ref 96–106)
Creatinine, Ser: 0.79 mg/dL (ref 0.57–1.00)
Globulin, Total: 2.1 g/dL (ref 1.5–4.5)
Glucose: 88 mg/dL (ref 65–99)
Potassium: 4.2 mmol/L (ref 3.5–5.2)
Sodium: 139 mmol/L (ref 134–144)
Total Protein: 6.2 g/dL (ref 6.0–8.5)
eGFR: 83 mL/min/{1.73_m2} (ref >59)

## 2021-03-18 LAB — T4, FREE: Free T4: 1.64 ng/dL (ref 0.82–1.77)

## 2021-03-18 LAB — TSH: TSH: 0.613 u[IU]/mL (ref 0.450–4.500)

## 2021-03-18 LAB — VITAMIN D 25 HYDROXY (VIT D DEFICIENCY, FRACTURES): Vit D, 25-Hydroxy: 45.4 ng/mL (ref 30.0–100.0)

## 2021-03-18 MED ORDER — LEVOTHYROXINE SODIUM 137 MCG PO TABS: 137.0000 ug | ORAL_TABLET | Freq: Every day | ORAL | 3 refills | Status: DC

## 2021-03-19 ENCOUNTER — Telehealth: Payer: Self-pay | Admitting: *Deleted

## 2021-03-19 NOTE — Telephone Encounter (Signed)
PA in process for nurtec (Key: [id number])

## 2021-03-21 LAB — TOXASSURE SELECT 13 (MW), URINE

## 2021-03-24 NOTE — Telephone Encounter (Signed)
Status: Denied. Notification: Completed 

## 2021-03-24 NOTE — Telephone Encounter (Signed)
Pt aware.

## 2021-03-24 NOTE — Telephone Encounter (Signed)
Please inform pt

## 2021-04-01 ENCOUNTER — Ambulatory Visit (INDEPENDENT_AMBULATORY_CARE_PROVIDER_SITE_OTHER): Payer: PPO

## 2021-04-01 ENCOUNTER — Other Ambulatory Visit: Payer: Self-pay | Admitting: Family Medicine

## 2021-04-01 VITALS — Ht 60.0 in | Wt 198.0 lb

## 2021-04-01 DIAGNOSIS — Z0000 Encounter for general adult medical examination without abnormal findings: Secondary | ICD-10-CM | POA: Diagnosis not present

## 2021-04-01 NOTE — Progress Notes (Addendum)
Subjective:   Alicia Russo is a 65 y.o. female who presents for Medicare Annual (Subsequent) preventive examination.  Virtual Visit via Telephone Note  I connected with  Alicia Russo on 04/01/21 at 10:30 AM EDT by telephone and verified that I am speaking with the correct person using two identifiers.  Location: Patient: Home Provider: [facility] Persons participating in the virtual visit: patient/Nurse Health Advisor   I discussed the limitations, risks, security and privacy concerns of performing an evaluation and management service by telephone and the availability of in person appointments. The patient expressed understanding and agreed to proceed.  Interactive audio and video telecommunications were attempted between this nurse and patient, however failed, due to patient having technical difficulties OR patient did not have access to video capability.  We continued and completed visit with audio only.  Some vital signs may be absent or patient reported.   Alicia Russo E Alicia Brame, LPN   Review of Systems     Cardiac Risk Factors include: advanced age (>48men, >59 women);obesity (BMI >30kg/m2);dyslipidemia;hypertension     Objective:    Today's Vitals   04/01/21 0957  Weight: 198 lb (89.8 kg)  Height: 5' (1.524 m)  PainSc: 3    Body mass index is 38.67 kg/m.  Advanced Directives 04/01/2021 03/26/2020 04/29/2017 02/16/2017 02/15/2017 02/12/2017  Does Patient Have a Medical Advance Directive? Yes Yes Yes Yes Yes No  Type of Estate agent of [place];Living will Healthcare Power of [employer] of [place];Living will Living will;Healthcare Power of Attorney Living will;Healthcare Power of Attorney -  Does patient want to make changes to medical advance directive? - No - Patient declined No - Patient declined No - Patient declined - -  Copy of Healthcare Power of Attorney in Chart? No - copy requested No - copy requested No - copy requested No - copy  requested - -    Current Medications (verified) Outpatient Encounter Medications as of 04/01/2021  Medication Sig  . albuterol (PROVENTIL) (2.5 MG/3ML) 0.083% nebulizer solution USE ONE vial in nebulizer EVERY 6 HOURS AS NEEDED wheezing SHORTNESS OF BREATH  . albuterol (VENTOLIN HFA) 108 (90 Base) MCG/ACT inhaler Inhale 2 puffs into the lungs every 6 (six) hours as needed for wheezing or shortness of breath.  . ALPRAZolam (XANAX) 0.5 MG tablet Take 0.5-1 tablets (0.25-0.5 mg total) by mouth 2 (two) times daily as needed for anxiety.  Marland Kitchen aspirin 81 MG EC tablet Take 81 mg by mouth 2 (two) times daily.   . betamethasone valerate ointment (VALISONE) 0.1 % Apply 1 application topically 2 (two) times daily. x7-10 days prn rash  . Biotin 10 MG TABS Take 1 tablet by mouth 2 (two) times daily.   . cetirizine (ZYRTEC) 10 MG tablet Take 1 tablet (10 mg total) by mouth daily.  . Cholecalciferol (VITAMIN D3) 2000 UNITS TABS Take 1 tablet by mouth 2 (two) times daily.  . Cyanocobalamin (VITAMIN B-12 PO) Take 1 tablet by mouth 2 (two) times daily.  Marland Kitchen ezetimibe (ZETIA) 10 MG tablet TAKE 1 TABLET BY MOUTH EVERY DAY  . ibuprofen (ADVIL) 800 MG tablet Take 1 tablet (800 mg total) by mouth every 8 (eight) hours as needed for headache or moderate pain.  Marland Kitchen levothyroxine (SYNTHROID) 137 MCG tablet Take 1 tablet (137 mcg total) by mouth daily.  Marland Kitchen losartan (COZAAR) 50 MG tablet TAKE 1 TABLET BY MOUTH DAILY  . magnesium 30 MG tablet Take 30 mg by mouth 2 (two) times daily.  Marland Kitchen  methocarbamol (ROBAXIN) 500 MG tablet TAKE 1 TABLET BY MOUTH EVERY 6 HOURS AS NEEDED FOR MUSCLE SPASMS  . Milnacipran (SAVELLA) 50 MG TABS tablet Take 1 tablet (50 mg total) by mouth 2 (two) times daily.  . montelukast (SINGULAIR) 10 MG tablet Take 1 tablet (10 mg total) by mouth every evening.  Marland Kitchen. omeprazole (PRILOSEC) 20 MG capsule Take 1 capsule (20 mg total) by mouth daily.  . ondansetron (ZOFRAN) 4 MG tablet Take 1 tablet (4 mg total) by mouth  every 8 (eight) hours as needed for nausea or vomiting.  . predniSONE (STERAPRED UNI-PAK 21 TAB) 10 MG (21) TBPK tablet As directed x 6 days  . Rimegepant Sulfate (NURTEC) 75 MG TBDP Take 1 tablet by mouth every other day. For migraine prevention   No facility-administered encounter medications on file as of 04/01/2021.    Allergies (verified) Bee venom, Cherry flavor, Citalopram hydrobromide, Statins, and Tramadol   History: Past Medical History:  Diagnosis Date  . Anxiety   . Asthma   . AVM (arteriovenous malformation)    Intracranial with seizures  . Closed fracture of left distal radius   . Fibromyalgia   . GERD (gastroesophageal reflux disease)   . Hyperlipidemia   . Hypothyroidism   . Plantar fasciitis of right foot   . Seizures (HCC)    AVM repair   Past Surgical History:  Procedure Laterality Date  . CHOLECYSTECTOMY    . CRANIOTOMY     for AVM  . FOOT SURGERY  2016   Due to plantar fasciitis  . ORIF WRIST FRACTURE Left 02/16/2017   Procedure: OPEN REDUCTION INTERNAL FIXATION (ORIF) LEFT WRIST FRACTURE;  Surgeon: Sheral Apleyimothy D Murphy, MD;  Location: [facility];  Service: Orthopedics;  Laterality: Left;   Family History  Problem Relation Age of Onset  . Emphysema Father        died at age 65  . COPD Father   . Hypertension Mother    Social History   Socioeconomic History  . Marital status: Single    Spouse name: Not on file  . Number of children: 2  . Years of education: Not on file  . Highest education level: GED or equivalent  Occupational History    Comment: disability  Tobacco Use  . Smoking status: Former Smoker    Years: 20.00    Quit date: 04/30/1999    Years since quitting: 21.9  . Smokeless tobacco: Never Used  Vaping Use  . Vaping Use: Never used  Substance and Sexual Activity  . Alcohol use: No    Alcohol/week: 0.0 standard drinks  . Drug use: No  . Sexual activity: Not on file  Other Topics Concern  . Not on file  Social  History Narrative   Lives alone. Her mother lives 15 minutes away. Both children out of town, but daughter visits often.   Social Determinants of Health   Financial Resource Strain: Low Risk   . Difficulty of Paying Living Expenses: Not hard at all  Food Insecurity: No Food Insecurity  . Worried About Programme researcher, broadcasting/film/videounning Out of Food in the Last Year: Never true  . Ran Out of Food in the Last Year: Never true  Transportation Needs: No Transportation Needs  . Lack of Transportation (Medical): No  . Lack of Transportation (Non-Medical): No  Physical Activity: Sufficiently Active  . Days of Exercise per Week: 6 days  . Minutes of Exercise per Session: 60 min  Stress: No Stress Concern Present  .  Feeling of Stress : Only a little  Social Connections: Moderately Integrated  . Frequency of Communication with Friends and Family: More than three times a week  . Frequency of Social Gatherings with Friends and Family: More than three times a week  . Attends Religious Services: More than 4 times per year  . Active Member of Clubs or Organizations: Yes  . Attends Banker Meetings: More than 4 times per year  . Marital Status: Divorced    Tobacco Counseling Counseling given: No   Clinical Intake:  Pre-visit preparation completed: Yes  Pain : 0-10 Pain Score: 3  Pain Type: Chronic pain Pain Location: Generalized Pain Descriptors / Indicators: Aching Pain Onset: More than a month ago Pain Frequency: Intermittent     BMI - recorded: 38.67 Nutritional Status: BMI > 30  Obese Nutritional Risks: None Diabetes: No  How often do you need to have someone help you when you read instructions, pamphlets, or other written materials from your doctor or pharmacy?: 1 - Never  Diabetic? No  Interpreter Needed?: No  Information entered by :: Epiphany Seltzer, LPN   Activities of Daily Living In your present state of health, do you have any difficulty performing the following activities:  04/01/2021 08/26/2020  Hearing? N Y  Comment - some problems with right ear s/p brain surgery 20 years ago  Vision? N Y  Comment - no peripheral vision  Difficulty concentrating or making decisions? N Y  Comment - short-term memory problems s/p seizure and brain aneurysm surgery 20 years ago  Walking or climbing stairs? N N  Dressing or bathing? N N  Doing errands, shopping? N Y  Quarry manager and eating ? N N  Using the Toilet? N N  In the past six months, have you accidently leaked urine? N N  Do you have problems with loss of bowel control? N N  Managing your Medications? N N  Managing your Finances? N N  Housekeeping or managing your Housekeeping? N N  Some recent data might be hidden    Patient Care Team: Raliegh Ip, DO as PCP - General (Family Medicine) Gwenith Daily, RN as Case Manager [place], Lilla Shook, [facility] (Pharmacist)  Indicate any recent Medical Services you may have received from other than [facility] providers in the past year (date may be approximate).     Assessment:   This is a routine wellness examination for [employer].  Hearing/Vision screen  Hearing Screening             Right ear:           Left ear:           Comments: Denies hearing difficulties  Vision Screening Comments: Every other year visits with Dr Conley Rolls in [place] - wears glasses  Dietary issues and exercise activities discussed: Current Exercise Habits: Home exercise routine, Type of exercise: walking, Time (Minutes): 60, Frequency (Times/Week): 6, Weekly Exercise (Minutes/Week): 360, Intensity: Mild  Goals    . Chronic Disease Management Needs     CARE PLAN ENTRY (see longtitudinal plan of care for additional care plan information)  Current Barriers:  . Chronic Disease Management support, education, and care coordination needs related to HTN, asthma, GERD, hypothyroidism, HLD, migraine, osteoporosis, fibromyalgia, memory problems (s/p brain  aneurysm surgery and seizure 20 years ago)  Clinical Goal(s) related to HTN, asthma, GERD, hypothyroidism, HLD, migraine, osteoporosis, fibromyalgia, memory problems (s/p brain aneurysm surgery and seizure 20 years ago):  Over the next 60  days, patient will:  . Work with the care management team to address educational, disease management, and care coordination needs  . Begin or continue self health monitoring activities as directed today Measure and record blood pressure 3 times per week . Call provider office for new or worsened signs and symptoms Blood pressure findings outside established parameters . 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 Collaborated with appropriate clinical care team members regarding patient needs . Previously discussed social support o 2 children, 4 grandchildren, mother, and neighbors . 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 Previously discussed ability to perform ADLs . Previously discussed mobility and physical activity level . 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 DIET - EAT MORE FRUITS AND VEGETABLES    . Exercise 150 min/wk Moderate Activity    . Manage Pain      Patient Activities: . plan exercise or activity when pain is best controlled . track times pain is worst and when it is best . use ice or heat for pain relief . Work with PCP and CCM team to find a medication that works well for you    Why is this important?    Day-to-day life can be hard when you have chronic pain.   Pain medicine is just one piece of the treatment puzzle.   You can try these action steps to help you manage your pain.         Depression Screen PHQ 2/9 Scores 04/01/2021 03/17/2021 09/17/2020 03/18/2020 02/24/2019 12/20/2018 12/05/2018  PHQ - 2 Score 0 0 0 0 0 0 1  PHQ- 9 Score - - 0 0 - - -    Fall Risk Fall Risk  04/01/2021 03/17/2021 03/26/2020 02/24/2019 12/20/2018  Falls in the past year? 0 0 0 0 0  Number falls in past yr: 0 - - - -  Injury with Fall? 0 - - - -  Risk for fall due to : Medication side effect - - - -  Follow up Falls prevention discussed - - - -    FALL RISK PREVENTION PERTAINING TO THE HOME:  Any stairs in or around the home? Yes  If so, are there any without handrails? No  Home free of loose throw rugs in walkways, pet beds, electrical cords, etc? Yes  Adequate lighting in your home to reduce risk of falls? Yes   ASSISTIVE DEVICES UTILIZED TO PREVENT FALLS:  Life alert? No  Use of a cane, walker or w/c? No  Grab bars in the bathroom? No  Shower chair or bench in shower? Yes  Elevated toilet seat or a handicapped toilet? No   TIMED UP AND GO:  Was the test performed? No . Telephonic visit.  Cognitive Function: MMSE - Mini Mental State Exam 04/29/2017  Orientation to time 5  Orientation to Place 5  Registration 3  Attention/ Calculation 5  Recall 3  Language- name 2 objects 2  Language- repeat 1  Language- follow 3 step command 1  Language- read & follow direction 1  Write a sentence 1  Copy design 0  Total score 27     6CIT Screen 03/26/2020  What Year? 0 points  What month? 0 points  What time? 0 points  Count back from 20 0  points  Months in reverse 0 points  Repeat phrase 0 points  Total Score 0    Immunizations Immunization History  Administered Date(s) Administered  . Influenza,inj,Quad PF,6+ Mos 09/16/2020  . Influenza,inj,quad, With Preservative 09/01/2018, 07/27/2019  . Influenza-Unspecified 08/26/2016, 09/22/2017, 08/24/2018  . Moderna Sars-Covid-2 Vaccination 04/08/2020    TDAP status: Due, Education has been provided regarding the importance of this vaccine. Advised may receive this vaccine at local pharmacy or Health Dept. Aware to provide a copy of the vaccination record if obtained from local pharmacy or Health Dept. Verbalized acceptance and understanding.  Flu Vaccine status: Up to date  Pneumococcal vaccine status: Up to date  Covid-19 vaccine status: Information provided on how to obtain vaccines.   Qualifies for Shingles Vaccine? Yes   Zostavax completed No   Shingrix Completed?: No.    Education has been provided regarding the importance of this vaccine. Patient has been advised to call insurance company to determine out of pocket expense if they have not yet received this vaccine. Advised may also receive vaccine at local pharmacy or Health Dept. Verbalized acceptance and understanding.  Screening Tests Health Maintenance  Topic Date Due  . COLONOSCOPY (Pts 45-33yrs Insurance coverage will need to be confirmed)  Never done  . PAP SMEAR-Modifier  10/11/2020  . TETANUS/TDAP  09/17/2021 (Originally 09/14/1975)  . HIV Screening  03/17/2022 (Originally 09/14/1971)  . COVID-19 Vaccine (2 - Moderna 3-dose series) 04/02/2022 (Originally 05/06/2020)  . INFLUENZA VACCINE  07/14/2021  . DEXA SCAN  10/09/2022  . MAMMOGRAM  01/02/2023  . Hepatitis C Screening  Completed  . HPV VACCINES  Aged Out    Health Maintenance  Health Maintenance Due  Topic Date Due  . COLONOSCOPY (Pts 45-50yrs Insurance coverage will need to be confirmed)  Never done  . PAP SMEAR-Modifier  10/11/2020     Cologuard was ordered - patient hasn't received this yet.  Mammogram status: Completed 01/02/2021. Repeat every year  Bone Density status: Completed 10/09/2020. Results reflect: Bone density results: OSTEOPOROSIS. Repeat every 2 years.  Lung Cancer Screening: (Low Dose CT Chest recommended if Age 50-80 years, 30 pack-year currently smoking OR have quit w/in 15years.) does not qualify.   Additional Screening:  Hepatitis C Screening: does qualify; Completed 10/05/2016  Vision Screening: Recommended annual ophthalmology exams for early detection of glaucoma and other disorders of the eye. Is the patient up to date with their annual eye exam?  Yes  Who is the provider or what is the name of the office in which the patient attends annual eye exams? Dr Conley Rolls in [place] If pt is not established with a provider, would they like to be referred to a provider to establish care? No .   Dental Screening: Recommended annual dental exams for proper oral hygiene  Community Resource Referral / Chronic Care Management: CRR required this visit?  No   CCM required this visit?  No      Plan:     I have personally reviewed and noted  the following in the patient's chart:   . Medical and social history . Use of alcohol, tobacco or illicit drugs  . Current medications and supplements . Functional ability and status . Nutritional status . Physical activity . Advanced directives . List of other physicians . Hospitalizations, surgeries, and ER visits in previous 12 months . Vitals . Screenings to include cognitive, depression, and falls . Referrals and appointments  In addition, I have reviewed and discussed with patient certain preventive protocols, quality metrics, and best practice recommendations. A written personalized care plan for preventive services as well as general preventive health recommendations were provided to patient.     [place] Constable, LPN   [id number]   Nurse Notes:  None

## 2021-04-01 NOTE — Patient Instructions (Signed)
Alicia Russo , Thank you for taking time to come for your Medicare Wellness Visit. I appreciate your ongoing commitment to your health goals. Please review the following plan we discussed and let me know if I can assist you in the future.   Screening recommendations/referrals: Colonoscopy: Return cologuard once you receive it - If you don't in the next couple of weeks, please call our office Mammogram: Done 01/02/2021 - Repeat every year Bone Density: Done 10/09/2020 - Repeat every 2 years Recommended yearly ophthalmology/optometry visit for glaucoma screening and checkup Recommended yearly dental visit for hygiene and checkup  Vaccinations: Influenza vaccine: Done 09/16/2020 - Repeat every year Pneumococcal vaccine: Had Prevnar at [facility], due for Pneumovax in October 2022 - She will get dates and let us know. Tdap vaccine: Due Shingles vaccine: Shingrix discussed. Please contact your pharmacy for coverage information.   Covid-19: One dose done 04/08/2020 -Due for boosters  Advanced directives: Please bring a copy of your health care power of attorney and living will to the office to be added to your chart at your convenience.  Conditions/risks identified: Continue staying active, eating healthy, practicing fall prevention, aim for 6-8 glasses of water each day.  Next appointment: Follow up in one year for your annual wellness visit.   Preventive Care 40-64 Years, Female Preventive care refers to lifestyle choices and visits with your health care provider that can promote health and wellness. What does preventive care include?  A yearly physical exam. This is also called an annual well check.  Dental exams once or twice a year.  Routine eye exams. Ask your health care provider how often you should have your eyes checked.  Personal lifestyle choices, including:  Daily care of your teeth and gums.  Regular physical activity.  Eating a healthy diet.  Avoiding tobacco and drug  use.  Limiting alcohol use.  Practicing safe sex.  Taking low-dose aspirin daily starting at age 27.  Taking vitamin and mineral supplements as recommended by your health care provider. What happens during an annual well check? The services and screenings done by your health care provider during your annual well check will depend on your age, overall health, lifestyle risk factors, and family history of disease. Counseling  Your health care provider may ask you questions about your:  Alcohol use.  Tobacco use.  Drug use.  Emotional well-being.  Home and relationship well-being.  Sexual activity.  Eating habits.  Work and work Statistician.  Method of birth control.  Menstrual cycle.  Pregnancy history. Screening  You may have the following tests or measurements:  Height, weight, and BMI.  Blood pressure.  Lipid and cholesterol levels. These may be checked every 5 years, or more frequently if you are over 66 years old.  Skin check.  Lung cancer screening. You may have this screening every year starting at age 68 if you have a 30-pack-year history of smoking and currently smoke or have quit within the past 15 years.  Fecal occult blood test (FOBT) of the stool. You may have this test every year starting at age 54.  Flexible sigmoidoscopy or colonoscopy. You may have a sigmoidoscopy every 5 years or a colonoscopy every 10 years starting at age 75.  Hepatitis C blood test.  Hepatitis B blood test.  Sexually transmitted disease (STD) testing.  Diabetes screening. This is done by checking your blood sugar (glucose) after you have not eaten for a while (fasting). You may have this done every 1-3 years.  Mammogram. This may be done every 1-2 years. Talk to your health care provider about when you should start having regular mammograms. This may depend on whether you have a family history of breast cancer.  BRCA-related cancer screening. This may be done if you  have a family history of breast, ovarian, tubal, or peritoneal cancers.  Pelvic exam and Pap test. This may be done every 3 years starting at age 33. Starting at age 26, this may be done every 5 years if you have a Pap test in combination with an HPV test.  Bone density scan. This is done to screen for osteoporosis. You may have this scan if you are at high risk for osteoporosis. Discuss your test results, treatment options, and if necessary, the need for more tests with your health care provider. Vaccines  Your health care provider may recommend certain vaccines, such as:  Influenza vaccine. This is recommended every year.  Tetanus, diphtheria, and acellular pertussis (Tdap, Td) vaccine. You may need a Td booster every 10 years.  Zoster vaccine. You may need this after age 88.  Pneumococcal 13-valent conjugate (PCV13) vaccine. You may need this if you have certain conditions and were not previously vaccinated.  Pneumococcal polysaccharide (PPSV23) vaccine. You may need one or two doses if you smoke cigarettes or if you have certain conditions. Talk to your health care provider about which screenings and vaccines you need and how often you need them. This information is not intended to replace advice given to you by your health care provider. Make sure you discuss any questions you have with your health care provider. Document Released: 12/27/2015 Document Revised: 08/19/2016 Document Reviewed: 10/01/2015 Elsevier Interactive Patient Education  2017 [employer] Prevention in the Home Falls can cause injuries. They can happen to people of all ages. There are many things you can do to make your home safe and to help prevent falls. What can I do on the outside of my home?  Regularly fix the edges of walkways and driveways and fix any cracks.  Remove anything that might make you trip as you walk through a door, such as a raised step or threshold.  Trim any bushes or trees on  the path to your home.  Use bright outdoor lighting.  Clear any walking paths of anything that might make someone trip, such as rocks or tools.  Regularly check to see if handrails are loose or broken. Make sure that both sides of any steps have handrails.  Any raised decks and porches should have guardrails on the edges.  Have any leaves, snow, or ice cleared regularly.  Use sand or salt on walking paths during winter.  Clean up any spills in your garage right away. This includes oil or grease spills. What can I do in the bathroom?  Use night lights.  Install grab bars by the toilet and in the tub and shower. Do not use towel bars as grab bars.  Use non-skid mats or decals in the tub or shower.  If you need to sit down in the shower, use a plastic, non-slip stool.  Keep the floor dry. Clean up any water that spills on the floor as soon as it happens.  Remove soap buildup in the tub or shower regularly.  Attach bath mats securely with double-sided non-slip rug tape.  Do not have throw rugs and other things on the floor that can make you trip. What can I do in the bedroom?  Use night lights.  Make sure that you have a light by your bed that is easy to reach.  Do not use any sheets or blankets that are too big for your bed. They should not hang down onto the floor.  Have a firm chair that has side arms. You can use this for support while you get dressed.  Do not have throw rugs and other things on the floor that can make you trip. What can I do in the kitchen?  Clean up any spills right away.  Avoid walking on wet floors.  Keep items that you use a lot in easy-to-reach places.  If you need to reach something above you, use a strong step stool that has a grab bar.  Keep electrical cords out of the way.  Do not use floor polish or wax that makes floors slippery. If you must use wax, use non-skid floor wax.  Do not have throw rugs and other things on the floor that  can make you trip. What can I do with my stairs?  Do not leave any items on the stairs.  Make sure that there are handrails on both sides of the stairs and use them. Fix handrails that are broken or loose. Make sure that handrails are as long as the stairways.  Check any carpeting to make sure that it is firmly attached to the stairs. Fix any carpet that is loose or worn.  Avoid having throw rugs at the top or bottom of the stairs. If you do have throw rugs, attach them to the floor with carpet tape.  Make sure that you have a light switch at the top of the stairs and the bottom of the stairs. If you do not have them, ask someone to add them for you. What else can I do to help prevent falls?  Wear shoes that:  Do not have high heels.  Have rubber bottoms.  Are comfortable and fit you well.  Are closed at the toe. Do not wear sandals.  If you use a stepladder:  Make sure that it is fully opened. Do not climb a closed stepladder.  Make sure that both sides of the stepladder are locked into place.  Ask someone to hold it for you, if possible.  Clearly mark and make sure that you can see:  Any grab bars or handrails.  First and last steps.  Where the edge of each step is.  Use tools that help you move around (mobility aids) if they are needed. These include:  Canes.  Walkers.  Scooters.  Crutches.  Turn on the lights when you go into a dark area. Replace any light bulbs as soon as they burn out.  Set up your furniture so you have a clear path. Avoid moving your furniture around.  If any of your floors are uneven, fix them.  If there are any pets around you, be aware of where they are.  Review your medicines with your doctor. Some medicines can make you feel dizzy. This can increase your chance of falling. Ask your doctor what other things that you can do to help prevent falls. This information is not intended to replace advice given to you by your health care  provider. Make sure you discuss any questions you have with your health care provider. Document Released: 09/26/2009 Document Revised: 05/07/2016 Document Reviewed: 01/04/2015 Elsevier Interactive Patient Education  2017 [employer].

## 2021-06-09 ENCOUNTER — Other Ambulatory Visit: Payer: Self-pay | Admitting: Family Medicine

## 2021-06-09 DIAGNOSIS — I10 Essential (primary) hypertension: Secondary | ICD-10-CM

## 2021-07-07 ENCOUNTER — Telehealth: Payer: Self-pay | Admitting: Family Medicine

## 2021-07-07 ENCOUNTER — Other Ambulatory Visit: Payer: Self-pay

## 2021-07-07 DIAGNOSIS — Z1211 Encounter for screening for malignant neoplasm of colon: Secondary | ICD-10-CM

## 2021-07-07 NOTE — Telephone Encounter (Signed)
Yes, if she is average risk (no GI bleed, no famHx or personal Hx of colon cancers), ok to order cologuard.

## 2021-07-08 ENCOUNTER — Telehealth: Payer: PPO | Admitting: Physician Assistant

## 2021-07-08 DIAGNOSIS — J4521 Mild intermittent asthma with (acute) exacerbation: Secondary | ICD-10-CM | POA: Diagnosis not present

## 2021-07-08 DIAGNOSIS — J069 Acute upper respiratory infection, unspecified: Secondary | ICD-10-CM

## 2021-07-08 MED ORDER — BENZONATATE 100 MG PO CAPS: 100.0000 mg | ORAL_CAPSULE | Freq: Three times a day (TID) | ORAL | 0 refills | Status: DC | PRN

## 2021-07-08 MED ORDER — PREDNISONE 10 MG (21) PO TBPK: ORAL_TABLET | ORAL | 0 refills | Status: DC

## 2021-07-08 NOTE — Progress Notes (Signed)
We are sorry that you are not feeling well.  Here is how we plan to help!  Based on your presentation I believe you most likely have A cough due to a virus.  This is called viral bronchitis and is best treated by rest, plenty of fluids and control of the cough.  You may use Ibuprofen or Tylenol as directed to help your symptoms.     In addition you may use A prescription cough medication called Tessalon Perles 100mg. You may take 1-2 capsules every 8 hours as needed for your cough.  Prednisone 10 mg daily for 6 days (see taper instructions below)  Directions for 6 day taper: Day 1: 2 tablets before breakfast, 1 after both lunch & dinner and 2 at bedtime Day 2: 1 tab before breakfast, 1 after both lunch & dinner and 2 at bedtime Day 3: 1 tab at each meal & 1 at bedtime Day 4: 1 tab at breakfast, 1 at lunch, 1 at bedtime Day 5: 1 tab at breakfast & 1 tab at bedtime Day 6: 1 tab at breakfast  From your responses in the eVisit questionnaire you describe inflammation in the upper respiratory tract which is causing a significant cough.  This is commonly called Bronchitis and has four common causes:   Allergies Viral Infections Acid Reflux Bacterial Infection Allergies, viruses and acid reflux are treated by controlling symptoms or eliminating the cause. An example might be a cough caused by taking certain blood pressure medications. You stop the cough by changing the medication. Another example might be a cough caused by acid reflux. Controlling the reflux helps control the cough.  USE OF BRONCHODILATOR ("RESCUE") INHALERS: There is a risk from using your bronchodilator too frequently.  The risk is that over-reliance on a medication which only relaxes the muscles surrounding the breathing tubes can reduce the effectiveness of medications prescribed to reduce swelling and congestion of the tubes themselves.  Although you feel brief relief from the bronchodilator inhaler, your asthma may actually be  worsening with the tubes becoming more swollen and filled with mucus.  This can delay other crucial treatments, such as oral steroid medications. If you need to use a bronchodilator inhaler daily, several times per day, you should discuss this with your provider.  There are probably better treatments that could be used to keep your asthma under control.     HOME CARE Only take medications as instructed by your medical team. Complete the entire course of an antibiotic. Drink plenty of fluids and get plenty of rest. Avoid close contacts especially the very young and the elderly Cover your mouth if you cough or cough into your sleeve. Always remember to wash your hands A steam or ultrasonic humidifier can help congestion.   GET HELP RIGHT AWAY IF: You develop worsening fever. You become short of breath You cough up blood. Your symptoms persist after you have completed your treatment plan MAKE SURE YOU  Understand these instructions. Will watch your condition. Will get help right away if you are not doing well or get worse.    Thank you for choosing an e-visit.  Your e-visit answers were reviewed by a board certified advanced clinical practitioner to complete your personal care plan. Depending upon the condition, your plan could have included both over the counter or prescription medications.  Please review your pharmacy choice. Make sure the pharmacy is open so you can pick up prescription now. If there is a problem, you may contact your provider   through MyChart messaging and have the prescription routed to another pharmacy.  Your safety is important to us. If you have drug allergies check your prescription carefully.   For the next 24 hours you can use MyChart to ask questions about today's visit, request a non-urgent call back, or ask for a work or school excuse. You will get an email in the next two days asking about your experience. I hope that your e-visit has been valuable and will  speed your recovery.  I provided 6 minutes of non face-to-face time during this encounter for chart review and documentation.   

## 2021-07-13 DIAGNOSIS — J329 Chronic sinusitis, unspecified: Secondary | ICD-10-CM | POA: Diagnosis not present

## 2021-07-13 DIAGNOSIS — H9203 Otalgia, bilateral: Secondary | ICD-10-CM | POA: Diagnosis not present

## 2021-07-13 DIAGNOSIS — R509 Fever, unspecified: Secondary | ICD-10-CM | POA: Diagnosis not present

## 2021-07-13 DIAGNOSIS — J029 Acute pharyngitis, unspecified: Secondary | ICD-10-CM | POA: Diagnosis not present

## 2021-07-13 DIAGNOSIS — R0981 Nasal congestion: Secondary | ICD-10-CM | POA: Diagnosis not present

## 2021-07-13 DIAGNOSIS — J45901 Unspecified asthma with (acute) exacerbation: Secondary | ICD-10-CM | POA: Diagnosis not present

## 2021-07-13 DIAGNOSIS — R059 Cough, unspecified: Secondary | ICD-10-CM | POA: Diagnosis not present

## 2021-07-13 DIAGNOSIS — R0602 Shortness of breath: Secondary | ICD-10-CM | POA: Diagnosis not present

## 2021-08-13 ENCOUNTER — Telehealth: Payer: Self-pay | Admitting: Family Medicine

## 2021-08-14 NOTE — Telephone Encounter (Signed)
Please schedule/refer as CCM referral If patient has acute needs, please let me know Overbooked today!

## 2021-08-14 NOTE — Telephone Encounter (Signed)
Lmtcb.

## 2021-08-25 ENCOUNTER — Telehealth: Payer: Self-pay | Admitting: Family Medicine

## 2021-08-25 NOTE — Telephone Encounter (Signed)
Pt called back.  Please call cell number [phone] - thank you

## 2021-08-25 NOTE — Telephone Encounter (Signed)
LMTCB

## 2021-08-26 ENCOUNTER — Other Ambulatory Visit: Payer: Self-pay | Admitting: Family Medicine

## 2021-08-26 DIAGNOSIS — G43809 Other migraine, not intractable, without status migrainosus: Secondary | ICD-10-CM

## 2021-08-26 DIAGNOSIS — M797 Fibromyalgia: Secondary | ICD-10-CM

## 2021-08-27 ENCOUNTER — Encounter: Payer: Self-pay | Admitting: Family Medicine

## 2021-08-27 MED ORDER — METHOCARBAMOL 500 MG PO TABS: ORAL_TABLET | ORAL | 0 refills | Status: DC

## 2021-08-28 NOTE — Telephone Encounter (Signed)
Attempts to contact pt without a return call in over 3 days, will close encounter. °

## 2021-09-16 ENCOUNTER — Other Ambulatory Visit: Payer: Self-pay

## 2021-09-16 ENCOUNTER — Encounter: Payer: Self-pay | Admitting: Family Medicine

## 2021-09-16 ENCOUNTER — Other Ambulatory Visit: Payer: Self-pay | Admitting: Family Medicine

## 2021-09-16 ENCOUNTER — Ambulatory Visit (INDEPENDENT_AMBULATORY_CARE_PROVIDER_SITE_OTHER): Payer: PPO | Admitting: Family Medicine

## 2021-09-16 VITALS — BP 143/75 | HR 62 | Temp 97.9°F | Ht 60.0 in | Wt 204.0 lb

## 2021-09-16 DIAGNOSIS — E034 Atrophy of thyroid (acquired): Secondary | ICD-10-CM

## 2021-09-16 DIAGNOSIS — L304 Erythema intertrigo: Secondary | ICD-10-CM

## 2021-09-16 DIAGNOSIS — F410 Panic disorder [episodic paroxysmal anxiety] without agoraphobia: Secondary | ICD-10-CM

## 2021-09-16 DIAGNOSIS — B3731 Acute candidiasis of vulva and vagina: Secondary | ICD-10-CM

## 2021-09-16 DIAGNOSIS — M797 Fibromyalgia: Secondary | ICD-10-CM

## 2021-09-16 DIAGNOSIS — M810 Age-related osteoporosis without current pathological fracture: Secondary | ICD-10-CM | POA: Diagnosis not present

## 2021-09-16 DIAGNOSIS — Z1211 Encounter for screening for malignant neoplasm of colon: Secondary | ICD-10-CM

## 2021-09-16 DIAGNOSIS — G43809 Other migraine, not intractable, without status migrainosus: Secondary | ICD-10-CM

## 2021-09-16 DIAGNOSIS — F411 Generalized anxiety disorder: Secondary | ICD-10-CM

## 2021-09-16 MED ORDER — FLUCONAZOLE 150 MG PO TABS: 150.0000 mg | ORAL_TABLET | Freq: Once | ORAL | 0 refills | Status: AC

## 2021-09-16 MED ORDER — ALPRAZOLAM 0.5 MG PO TABS: 0.2500 mg | ORAL_TABLET | Freq: Two times a day (BID) | ORAL | 1 refills | Status: DC | PRN

## 2021-09-16 MED ORDER — METHOCARBAMOL 500 MG PO TABS: ORAL_TABLET | ORAL | 3 refills | Status: DC

## 2021-09-16 MED ORDER — NYSTATIN 100000 UNIT/GM EX CREA: 1.0000 "application " | TOPICAL_CREAM | Freq: Two times a day (BID) | CUTANEOUS | 2 refills | Status: DC

## 2021-09-16 NOTE — Telephone Encounter (Signed)
Patient came in office today and has decided to proceed with the Prolia injection.  Will send verification of benefits to [employer].

## 2021-09-16 NOTE — Progress Notes (Signed)
Subjective: CC: Hypothyroidism, yeast vaginitis, osteoporosis, migraine headache, chronic musculoskeletal pain associated with fibromyalgia PCP: Raliegh Ip, DO PFY:TWKM S Alicia Russo is a 65 y.o. female presenting to clinic today for:  1.  Acquired hypothyroidism Patient is compliant with thyroid medication.  She denies any tremor, change in bowel habit or energy.  She occasionally has heart palpitations  2.  Yeast vaginitis Patient reports she developed yeast vaginitis as well as a yeast infection under her breast and in her groin after taking Augmentin for sinus infection.  Asking for assistance with this  3.  Osteoporosis Patient is due for Prolia injection and asking about getting that set up.  4.  Migraine headache Patient reports excellent response to Nurtec for migraine headaches.  Unfortunately her insurance would not pay for this medicine for her.  She is asking for samples if we have them  5.  Fibromyalgia, chronic musculoskeletal pain Patient uses Robaxin intermittently for the after mentioned.  She denies any excessive daytime sedation in fact finds it this is less sedating than the tizanidine that she was previously prescribed.  She is asking that we have refills on file for her for future use.  Also needing a renewal on her handicap placard for fibro-, asthma, etc.  6.  Anxiety disorder Patient reports sparing use of alprazolam 0.5 mg.  Last refill was in April.  She still has a few tablets left over but she would like to have a prescription on hand for future use if needed.   ROS: Per HPI  Allergies  Allergen Reactions   Bee Venom     Cant breath, swell up   [place] Hives      dizziness   Citalopram Hydrobromide     Mood swings, gets "really really mean"   Statins     Myalgia and muscle pain   Tramadol     unknown   Past Medical History:  Diagnosis Date   Anxiety    Asthma    AVM (arteriovenous malformation)    Intracranial with seizures    Closed fracture of left distal radius    Fibromyalgia    GERD (gastroesophageal reflux disease)    Hyperlipidemia    Hypothyroidism    Plantar fasciitis of right foot    Seizures (HCC)    AVM repair    Current Outpatient Medications:    albuterol (PROVENTIL) (2.5 MG/3ML) 0.083% nebulizer solution, USE ONE vial in nebulizer EVERY 6 HOURS AS NEEDED wheezing SHORTNESS OF BREATH, Disp: 240 mL, Rfl: 5   albuterol (VENTOLIN HFA) 108 (90 Base) MCG/ACT inhaler, Inhale 2 puffs into the lungs every 6 (six) hours as needed for wheezing or shortness of breath., Disp: 8 g, Rfl: 0   ALPRAZolam (XANAX) 0.5 MG tablet, Take 0.5-1 tablets (0.25-0.5 mg total) by mouth 2 (two) times daily as needed for anxiety., Disp: 40 tablet, Rfl: 0   aspirin 81 MG EC tablet, Take 81 mg by mouth 2 (two) times daily. , Disp: , Rfl:    betamethasone valerate ointment (VALISONE) 0.1 %, Apply 1 application topically 2 (two) times daily. x7-10 days prn rash, Disp: 30 g, Rfl: 0   Biotin 10 MG TABS, Take 1 tablet by mouth 2 (two) times daily. , Disp: , Rfl:    cetirizine (ZYRTEC) 10 MG tablet, Take 1 tablet (10 mg total) by mouth daily., Disp: 90 tablet, Rfl: 3   Cholecalciferol (VITAMIN D3) 2000 UNITS TABS, Take 1 tablet by mouth 2 (two) times daily., Disp: ,  Rfl:    Cyanocobalamin (VITAMIN B-12 PO), Take 1 tablet by mouth 2 (two) times daily., Disp: , Rfl:    ezetimibe (ZETIA) 10 MG tablet, TAKE 1 TABLET BY MOUTH EVERY DAY, Disp: 30 tablet, Rfl: 5   ibuprofen (ADVIL) 800 MG tablet, Take 1 tablet (800 mg total) by mouth every 8 (eight) hours as needed for headache or moderate pain., Disp: 90 tablet, Rfl: 0   levothyroxine (SYNTHROID) 137 MCG tablet, Take 1 tablet (137 mcg total) by mouth daily., Disp: 90 tablet, Rfl: 3   losartan (COZAAR) 50 MG tablet, TAKE 1 TABLET BY MOUTH DAILY, Disp: 90 tablet, Rfl: 1   magnesium 30 MG tablet, Take 30 mg by mouth 2 (two) times daily., Disp: , Rfl:    methocarbamol (ROBAXIN) 500 MG tablet, TAKE  1 TABLET BY MOUTH EVERY 6 HOURS AS NEEDED FOR MUSCLE SPASMS, Disp: 40 tablet, Rfl: 0   Milnacipran (SAVELLA) 50 MG TABS tablet, Take 1 tablet (50 mg total) by mouth 2 (two) times daily., Disp: 180 tablet, Rfl: 0   montelukast (SINGULAIR) 10 MG tablet, Take 1 tablet (10 mg total) by mouth every evening., Disp: 90 tablet, Rfl: 3   omeprazole (PRILOSEC) 20 MG capsule, Take 1 capsule (20 mg total) by mouth daily., Disp: 90 capsule, Rfl: 3   ondansetron (ZOFRAN) 4 MG tablet, Take 1 tablet (4 mg total) by mouth every 8 (eight) hours as needed for nausea or vomiting., Disp: 20 tablet, Rfl: 0   Rimegepant Sulfate (NURTEC) 75 MG TBDP, Take 1 tablet by mouth every other day. For migraine prevention, Disp: 16 tablet, Rfl: prn Social History   Socioeconomic History   Marital status: Single    Spouse name: Not on file   Number of children: 2   Years of education: Not on file   Highest education level: GED or equivalent  Occupational History    Comment: disability  Tobacco Use   Smoking status: Former    Years: 20.00    Types: Cigarettes    Quit date: 04/30/1999    Years since quitting: 22.3   Smokeless tobacco: Never  Vaping Use   Vaping Use: Never used  Substance and Sexual Activity   Alcohol use: No    Alcohol/week: 0.0 standard drinks   Drug use: No   Sexual activity: Not on file  Other Topics Concern   Not on file  Social History Narrative   Lives alone. Her mother lives 15 minutes away. Both children out of town, but daughter visits often.   Social Determinants of Health   Financial Resource Strain: Low Risk    Difficulty of Paying Living Expenses: Not hard at all  Food Insecurity: No Food Insecurity   Worried About Programme researcher, broadcasting/film/video in the Last Year: Never true   Ran Out of Food in the Last Year: Never true  Transportation Needs: No Transportation Needs   Lack of Transportation (Medical): No   Lack of Transportation (Non-Medical): No  Physical Activity: Sufficiently Active    Days of Exercise per Week: 6 days   Minutes of Exercise per Session: 60 min  Stress: No Stress Concern Present   Feeling of Stress : Only a little  Social Connections: Moderately Integrated   Frequency of Communication with Friends and Family: More than three times a week   Frequency of Social Gatherings with Friends and Family: More than three times a week   Attends Religious Services: More than 4 times per year   Active Member of  Clubs or Organizations: Yes   Attends Engineer, structural: More than 4 times per year   Marital Status: Divorced  Catering manager Violence: Not At Risk   Fear of Current or Ex-Partner: No   Emotionally Abused: No   Physically Abused: No   Sexually Abused: No   Family History  Problem Relation Age of Onset   Emphysema Father        died at age 50   COPD Father    Hypertension Mother     Objective: Office vital signs reviewed. BP (!) 143/75   Pulse 62   Temp 97.9 F (36.6 C)   Ht 5' (1.524 m)   Wt 204 lb (92.5 kg)   SpO2 98%   BMI 39.84 kg/m   Physical Examination:  General: Awake, alert, morbidly obese, No acute distress HEENT: Normal; no exophthalmos.  No goiter. Cardio: regular rate and rhythm, S1S2 heard, no murmurs appreciated Pulm: clear to auscultation bilaterally, no wheezes, rhonchi or rales; normal work of breathing on room air Extremities: warm, well perfused, No edema, cyanosis or clubbing; +2 pulses bilaterally MSK: normal gait and station; ambulating independently Skin: dry; intact; she has erythematous and slightly indurated rash noted along the right inferior breast that has associated maceration.  Normal temperature Neuro: No tremor Psych: Jovial mood. Pleasant, interactive Depression screen [facility] 2/9 09/16/2021 09/16/2021 04/01/2021  Decreased Interest 0 0 0  Down, Depressed, Hopeless 0 0 0  PHQ - 2 Score 0 0 0  Altered sleeping 2 - -  Tired, decreased energy 2 - -  Change in appetite 0 - -  Feeling bad or failure  about yourself  0 - -  Trouble concentrating 1 - -  Moving slowly or fidgety/restless 0 - -  Suicidal thoughts 0 - -  PHQ-9 Score 5 - -  Difficult doing work/chores Somewhat difficult - -  Some recent data might be hidden   GAD 7 : Generalized Anxiety Score 09/16/2021 03/18/2020  Nervous, Anxious, on Edge 1 1  Control/stop worrying 0 0  Worry too much - different things 0 0  Trouble relaxing 1 1  Restless 1 2  Easily annoyed or irritable 1 1  Afraid - awful might happen 0 0  Total GAD 7 Score 4 5  Anxiety Difficulty Not difficult at all Not difficult at all   Assessment/ Plan: 65 y.o. female   Hypothyroidism due to acquired atrophy of thyroid - Plan: TSH, T4, Free  Age-related osteoporosis without current pathological fracture  Generalized anxiety disorder with panic attacks - Plan: ALPRAZolam (XANAX) 0.5 MG tablet  Other migraine without status migrainosus, not intractable - Plan: methocarbamol (ROBAXIN) 500 MG tablet  Fibromyalgia - Plan: methocarbamol (ROBAXIN) 500 MG tablet  Intertrigo - Plan: nystatin cream (MYCOSTATIN), fluconazole (DIFLUCAN) 150 MG tablet  Yeast vaginitis - Plan: nystatin cream (MYCOSTATIN), fluconazole (DIFLUCAN) 150 MG tablet  Intermittently symptomatic with palpitations.  We will check thyroid levels  Due for Prolia shot.  Anxiety disorder is under excellent control very sparing use of alprazolam.  [place] narcotic database reviewed and there were no red flags.  She is up-to-date on UDS and CSC.  Xanax renewed.  Robaxin renewed.  Use sparingly.  Handicap placard given  Skin rash consistent with intertrigo.  Nystatin cream prescribed.  Fluconazole for associated yeast vaginitis    No orders of the defined types were placed in this encounter.  No orders of the defined types were placed in this encounter.    Reyann Troop Judie Petit  [place], [place] [phone]

## 2021-09-16 NOTE — Patient Instructions (Signed)

## 2021-09-17 ENCOUNTER — Other Ambulatory Visit: Payer: Self-pay | Admitting: Family Medicine

## 2021-09-17 LAB — T4, FREE: Free T4: 1.64 ng/dL (ref 0.82–1.77)

## 2021-09-17 LAB — TSH: TSH: 0.737 u[IU]/mL (ref 0.450–4.500)

## 2021-09-20 ENCOUNTER — Other Ambulatory Visit: Payer: Self-pay | Admitting: Family Medicine

## 2021-09-20 DIAGNOSIS — E039 Hypothyroidism, unspecified: Secondary | ICD-10-CM

## 2021-09-24 DIAGNOSIS — Z029 Encounter for administrative examinations, unspecified: Secondary | ICD-10-CM

## 2021-10-16 ENCOUNTER — Other Ambulatory Visit: Payer: Self-pay | Admitting: Family Medicine

## 2021-10-16 ENCOUNTER — Encounter: Payer: Self-pay | Admitting: Family Medicine

## 2021-10-16 DIAGNOSIS — E039 Hypothyroidism, unspecified: Secondary | ICD-10-CM

## 2021-10-16 DIAGNOSIS — M797 Fibromyalgia: Secondary | ICD-10-CM

## 2021-10-16 DIAGNOSIS — G43809 Other migraine, not intractable, without status migrainosus: Secondary | ICD-10-CM

## 2021-10-16 MED ORDER — LEVOTHYROXINE SODIUM 137 MCG PO TABS: 137.0000 ug | ORAL_TABLET | Freq: Every day | ORAL | 3 refills | Status: DC

## 2021-10-17 ENCOUNTER — Other Ambulatory Visit: Payer: Self-pay | Admitting: Family Medicine

## 2021-10-17 DIAGNOSIS — F410 Panic disorder [episodic paroxysmal anxiety] without agoraphobia: Secondary | ICD-10-CM

## 2021-11-06 DIAGNOSIS — I10 Essential (primary) hypertension: Secondary | ICD-10-CM | POA: Diagnosis not present

## 2021-11-06 DIAGNOSIS — K219 Gastro-esophageal reflux disease without esophagitis: Secondary | ICD-10-CM | POA: Diagnosis not present

## 2021-11-06 DIAGNOSIS — N132 Hydronephrosis with renal and ureteral calculous obstruction: Secondary | ICD-10-CM | POA: Diagnosis not present

## 2021-11-06 DIAGNOSIS — E785 Hyperlipidemia, unspecified: Secondary | ICD-10-CM | POA: Diagnosis not present

## 2021-11-06 DIAGNOSIS — J45909 Unspecified asthma, uncomplicated: Secondary | ICD-10-CM | POA: Diagnosis not present

## 2021-11-06 DIAGNOSIS — R1110 Vomiting, unspecified: Secondary | ICD-10-CM | POA: Diagnosis not present

## 2021-11-06 DIAGNOSIS — N202 Calculus of kidney with calculus of ureter: Secondary | ICD-10-CM | POA: Diagnosis not present

## 2021-11-06 DIAGNOSIS — Z780 Asymptomatic menopausal state: Secondary | ICD-10-CM | POA: Diagnosis not present

## 2021-11-06 DIAGNOSIS — R569 Unspecified convulsions: Secondary | ICD-10-CM | POA: Diagnosis not present

## 2021-11-06 DIAGNOSIS — M797 Fibromyalgia: Secondary | ICD-10-CM | POA: Diagnosis not present

## 2021-11-17 DIAGNOSIS — J3489 Other specified disorders of nose and nasal sinuses: Secondary | ICD-10-CM | POA: Diagnosis not present

## 2021-11-17 DIAGNOSIS — R0981 Nasal congestion: Secondary | ICD-10-CM | POA: Diagnosis not present

## 2021-11-17 DIAGNOSIS — J329 Chronic sinusitis, unspecified: Secondary | ICD-10-CM | POA: Diagnosis not present

## 2021-11-17 DIAGNOSIS — R059 Cough, unspecified: Secondary | ICD-10-CM | POA: Diagnosis not present

## 2021-11-18 ENCOUNTER — Telehealth: Payer: Self-pay | Admitting: Family Medicine

## 2021-11-18 ENCOUNTER — Other Ambulatory Visit: Payer: Self-pay | Admitting: Family Medicine

## 2021-11-18 DIAGNOSIS — I10 Essential (primary) hypertension: Secondary | ICD-10-CM

## 2021-11-18 NOTE — Telephone Encounter (Signed)
Patient does want to get the PROLIA injection.  Please call to schedule. Also wants to set it up in payments like she did before.

## 2021-11-19 ENCOUNTER — Encounter: Payer: Self-pay | Admitting: Family Medicine

## 2021-11-19 ENCOUNTER — Ambulatory Visit (INDEPENDENT_AMBULATORY_CARE_PROVIDER_SITE_OTHER): Payer: PPO | Admitting: Family Medicine

## 2021-11-19 DIAGNOSIS — U071 COVID-19: Secondary | ICD-10-CM | POA: Diagnosis not present

## 2021-11-19 DIAGNOSIS — N200 Calculus of kidney: Secondary | ICD-10-CM

## 2021-11-19 NOTE — Progress Notes (Signed)
Telephone visit  Subjective: CC: COVID PCP: Janora Norlander, DO [id number] Alicia Russo is a 65 y.o. female calls for telephone consult today. Patient provides verbal consent for consult held via phone.  Due to COVID-19 pandemic this visit was conducted virtually. This visit type was conducted due to national recommendations for restrictions regarding the COVID-19 Pandemic (e.g. social distancing, sheltering in place) in an effort to limit this patient's exposure and mitigate transmission in our community. All issues noted in this document were discussed and addressed.  A physical exam was not performed with this format.   Location of patient: home Location of provider: [facility] Others present for call: none  1.  COVID-19 infection Patient reports that she was diagnosed with COVID-19 infection recently.  She was started on Paxlovid but has not yet taken her first dose.  She will to talk to me first.  She reports cough and some rib pain associated with a cough.  No hemoptysis.  She had her first fever today.  She has NSAIDs on hand if needed as needed fever.  She does not report any nausea, vomiting or diarrhea.  She is hydrating without difficulty.  2.  Renal stone Patient noted to have an obstructing right-sided renal stone.  She was treated in the ER for this 11/06/21.  She is remained asymptomatic since that time.  She reports good urine output.  She has been drinking lemonade in efforts to reduce risk of recurrent kidney stones.  No hematuria, dysuria or flank pain   ROS: Per HPI  Allergies  Allergen Reactions   Bee Venom     Cant breath, swell up   [place]      dizziness   Citalopram Hydrobromide     Mood swings, gets "really really mean"   Statins     Myalgia and muscle pain   Tramadol     unknown   Past Medical History:  Diagnosis Date   Anxiety    Asthma    AVM (arteriovenous malformation)    Intracranial with seizures   Closed fracture of left distal radius     Fibromyalgia    GERD (gastroesophageal reflux disease)    Hyperlipidemia    Hypothyroidism    Plantar fasciitis of right foot    Seizures (HCC)    AVM repair    Current Outpatient Medications:    nirmatrelvir & ritonavir (PAXLOVID) 20 x 150 MG & 10 x 100MG  TBPK, See package instructions., Disp: , Rfl:    albuterol (PROVENTIL) (2.5 MG/3ML) 0.083% nebulizer solution, USE ONE vial in nebulizer EVERY 6 HOURS AS NEEDED wheezing SHORTNESS OF BREATH, Disp: 240 mL, Rfl: 5   albuterol (VENTOLIN HFA) 108 (90 Base) MCG/ACT inhaler, Inhale 2 puffs into the lungs every 6 (six) hours as needed for wheezing or shortness of breath., Disp: 8 g, Rfl: 0   ALPRAZolam (XANAX) 0.5 MG tablet, Take 0.5-1 tablets (0.25-0.5 mg total) by mouth 2 (two) times daily as needed for anxiety., Disp: 40 tablet, Rfl: 1   aspirin 81 MG EC tablet, Take 81 mg by mouth 2 (two) times daily. , Disp: , Rfl:    azithromycin (ZITHROMAX) 250 MG tablet, Take by mouth., Disp: , Rfl:    benzonatate (TESSALON) 100 MG capsule, Take 200 mg by mouth 3 (three) times daily as needed., Disp: , Rfl:    betamethasone valerate ointment (VALISONE) 0.1 %, Apply 1 application topically 2 (two) times daily. x7-10 days prn rash (Patient not taking: Reported on 09/16/2021), Disp:  30 g, Rfl: 0   Biotin 10 MG TABS, Take 1 tablet by mouth 2 (two) times daily. , Disp: , Rfl:    cetirizine (ZYRTEC) 10 MG tablet, Take 1 tablet (10 mg total) by mouth daily., Disp: 90 tablet, Rfl: 3   Cholecalciferol (VITAMIN D3) 2000 UNITS TABS, Take 1 tablet by mouth 2 (two) times daily., Disp: , Rfl:    Cyanocobalamin (VITAMIN B-12 PO), Take 1 tablet by mouth 2 (two) times daily., Disp: , Rfl:    ezetimibe (ZETIA) 10 MG tablet, TAKE ONE TABLET BY MOUTH EVERY DAY, Disp: 30 tablet, Rfl: 4   ibuprofen (ADVIL) 800 MG tablet, Take 1 tablet (800 mg total) by mouth every 8 (eight) hours as needed for headache or moderate pain., Disp: 90 tablet, Rfl: 0   levothyroxine (SYNTHROID) 137  MCG tablet, Take 1 tablet (137 mcg total) by mouth daily., Disp: 90 tablet, Rfl: 3   losartan (COZAAR) 50 MG tablet, TAKE ONE TABLET BY MOUTH DAILY, Disp: 30 tablet, Rfl: 1   magnesium 30 MG tablet, Take 30 mg by mouth 2 (two) times daily., Disp: , Rfl:    methocarbamol (ROBAXIN) 500 MG tablet, TAKE 1 TABLET BY MOUTH EVERY 6 HOURS AS NEEDED FOR MUSCLE SPASMS (use sparingly), Disp: 40 tablet, Rfl: 3   Milnacipran (SAVELLA) 50 MG TABS tablet, Take 1 tablet (50 mg total) by mouth 2 (two) times daily. (Patient not taking: Reported on 09/16/2021), Disp: 180 tablet, Rfl: 0   montelukast (SINGULAIR) 10 MG tablet, TAKE ONE TABLET BY MOUTH EVERY EVENING, Disp: 90 tablet, Rfl: 0   nystatin cream (MYCOSTATIN), Apply 1 application topically 2 (two) times daily. To breast/ groin until rash resolved., Disp: 60 g, Rfl: 2   omeprazole (PRILOSEC) 20 MG capsule, Take 1 capsule (20 mg total) by mouth daily., Disp: 90 capsule, Rfl: 3   ondansetron (ZOFRAN) 4 MG tablet, Take 1 tablet (4 mg total) by mouth every 8 (eight) hours as needed for nausea or vomiting., Disp: 20 tablet, Rfl: 0   Rimegepant Sulfate (NURTEC) 75 MG TBDP, Take 1 tablet by mouth every other day. For migraine prevention, Disp: 16 tablet, Rfl: prn  Assessment/ Plan: 65 y.o. female   COVID-19  Nephrolithiasis  She is currently being treated with PAxlovid.  We reviewed the potential drug interactions and I have asked her to avoid those medications whilst on this antiviral.  We discussed red flag signs and symptoms warranting further evaluation in person or in the ER.  She voiced good understanding.  Continue current treatment regimen as outlined by urgent care  Regarding her nephrolithiasis, I reviewed her ER note.  She is asymptomatic currently and reports good urine output and good hydration.  We discussed red flag signs and symptoms warranting further evaluation.  She will contact me in the event that she needs referral to urology.  Start time:  9:30am End time: 9:48am  Total time spent on patient care (including telephone call/ virtual visit): 18 minutes  Taimane Stimmel Hulen Skains, DO [facility] [phone]

## 2021-12-01 NOTE — Telephone Encounter (Signed)
Appointment scheduled for 12/10/21 @ 2:15pm. Alicia Russo is in the refig.

## 2021-12-10 ENCOUNTER — Ambulatory Visit (INDEPENDENT_AMBULATORY_CARE_PROVIDER_SITE_OTHER): Payer: PPO

## 2021-12-10 ENCOUNTER — Other Ambulatory Visit: Payer: Self-pay

## 2021-12-10 DIAGNOSIS — M810 Age-related osteoporosis without current pathological fracture: Secondary | ICD-10-CM | POA: Diagnosis not present

## 2021-12-10 MED ORDER — DENOSUMAB 60 MG/ML ~~LOC~~ SOSY
60.0000 mg | PREFILLED_SYRINGE | Freq: Once | SUBCUTANEOUS | Status: AC
Start: 2021-12-10 — End: 2021-12-10
  Administered 2021-12-10: 15:00:00 60 mg via SUBCUTANEOUS

## 2021-12-10 NOTE — Progress Notes (Signed)
Prolia injection given to left arm.  Patient tolerated well.  Buy and bill 

## 2021-12-31 ENCOUNTER — Ambulatory Visit (INDEPENDENT_AMBULATORY_CARE_PROVIDER_SITE_OTHER): Payer: PPO | Admitting: Family Medicine

## 2021-12-31 ENCOUNTER — Encounter: Payer: Self-pay | Admitting: Family Medicine

## 2021-12-31 VITALS — BP 162/90 | HR 59 | Temp 97.3°F | Ht 60.0 in | Wt 195.6 lb

## 2021-12-31 DIAGNOSIS — N200 Calculus of kidney: Secondary | ICD-10-CM

## 2021-12-31 DIAGNOSIS — R3915 Urgency of urination: Secondary | ICD-10-CM | POA: Diagnosis not present

## 2021-12-31 LAB — MICROSCOPIC EXAMINATION: Renal Epithel, UA: NONE SEEN /hpf

## 2021-12-31 LAB — URINALYSIS, COMPLETE
Bilirubin, UA: NEGATIVE
Glucose, UA: NEGATIVE
Ketones, UA: NEGATIVE
Leukocytes,UA: NEGATIVE
Nitrite, UA: NEGATIVE
Protein,UA: NEGATIVE
Specific Gravity, UA: 1.01 (ref 1.005–1.030)
Urobilinogen, Ur: 0.2 mg/dL (ref 0.2–1.0)
pH, UA: 7 (ref 5.0–7.5)

## 2021-12-31 MED ORDER — TAMSULOSIN HCL 0.4 MG PO CAPS: 0.4000 mg | ORAL_CAPSULE | Freq: Every day | ORAL | 3 refills | Status: DC

## 2021-12-31 MED ORDER — KETOROLAC TROMETHAMINE 30 MG/ML IJ SOLN
30.0000 mg | Freq: Once | INTRAMUSCULAR | Status: AC
  Administered 2021-12-31: 30 mg via INTRAMUSCULAR

## 2021-12-31 MED ORDER — CEPHALEXIN 500 MG PO CAPS: 500.0000 mg | ORAL_CAPSULE | Freq: Two times a day (BID) | ORAL | 0 refills | Status: AC

## 2021-12-31 NOTE — Patient Instructions (Signed)
Use the tamsulosin daily for the next few weeks.  If you are able to see a stone come out, you can discontinue use.  This should allow your urine to come out easier and hopefully allow you to pass that stone.  In the meantime I am going ahead and treat you for a urinary tract infection just to be sure that you have this other thing going on.  You were given a dose of Toradol in office today to help with some of the discomfort associated with the symptoms.  If you develop nausea, vomiting, fever, severe pain or other concerning symptoms or signs, please seek immediate medical attention

## 2021-12-31 NOTE — Progress Notes (Signed)
Subjective: CC: UTI versus renal stone? PCP: Janora Norlander, DO [id number] S Macchio is a 66 y.o. female presenting to clinic today for:  1.  Urinary urgency Patient reports that she is been having urinary urgency, pelvic pressure and some flank pain bilaterally for the last couple of days.  She has a known renal stone that is 4 mm in size noted on the right via CT renal stone scan in the fall 2022.  At her last virtual visit in December, she had reported no symptoms.  She denies any hematuria but does feel like she has to frequently urinate.  No reports of nausea, vomiting, fevers.   ROS: Per HPI  Allergies  Allergen Reactions   Bee Venom     Cant breath, swell up   [place]      dizziness   Citalopram Hydrobromide     Mood swings, gets "really really mean"   Statins     Myalgia and muscle pain   Tramadol     unknown   Past Medical History:  Diagnosis Date   Anxiety    Asthma    AVM (arteriovenous malformation)    Intracranial with seizures   Closed fracture of left distal radius    Fibromyalgia    GERD (gastroesophageal reflux disease)    Hyperlipidemia    Hypothyroidism    Plantar fasciitis of right foot    Seizures (HCC)    AVM repair    Current Outpatient Medications:    albuterol (PROVENTIL) (2.5 MG/3ML) 0.083% nebulizer solution, USE ONE vial in nebulizer EVERY 6 HOURS AS NEEDED wheezing SHORTNESS OF BREATH, Disp: 240 mL, Rfl: 5   albuterol (VENTOLIN HFA) 108 (90 Base) MCG/ACT inhaler, Inhale 2 puffs into the lungs every 6 (six) hours as needed for wheezing or shortness of breath., Disp: 8 g, Rfl: 0   ALPRAZolam (XANAX) 0.5 MG tablet, Take 0.5-1 tablets (0.25-0.5 mg total) by mouth 2 (two) times daily as needed for anxiety., Disp: 40 tablet, Rfl: 1   aspirin 81 MG EC tablet, Take 81 mg by mouth 2 (two) times daily. , Disp: , Rfl:    Biotin 10 MG TABS, Take 1 tablet by mouth 2 (two) times daily. , Disp: , Rfl:    cetirizine (ZYRTEC) 10 MG tablet,  Take 1 tablet (10 mg total) by mouth daily., Disp: 90 tablet, Rfl: 3   Cholecalciferol (VITAMIN D3) 2000 UNITS TABS, Take 1 tablet by mouth 2 (two) times daily., Disp: , Rfl:    Cyanocobalamin (VITAMIN B-12 PO), Take 1 tablet by mouth 2 (two) times daily., Disp: , Rfl:    ezetimibe (ZETIA) 10 MG tablet, TAKE ONE TABLET BY MOUTH EVERY DAY, Disp: 30 tablet, Rfl: 4   ibuprofen (ADVIL) 800 MG tablet, Take 1 tablet (800 mg total) by mouth every 8 (eight) hours as needed for headache or moderate pain., Disp: 90 tablet, Rfl: 0   levothyroxine (SYNTHROID) 137 MCG tablet, Take 1 tablet (137 mcg total) by mouth daily., Disp: 90 tablet, Rfl: 3   losartan (COZAAR) 50 MG tablet, TAKE ONE TABLET BY MOUTH DAILY, Disp: 30 tablet, Rfl: 1   magnesium 30 MG tablet, Take 30 mg by mouth 2 (two) times daily., Disp: , Rfl:    methocarbamol (ROBAXIN) 500 MG tablet, TAKE 1 TABLET BY MOUTH EVERY 6 HOURS AS NEEDED FOR MUSCLE SPASMS (use sparingly), Disp: 40 tablet, Rfl: 3   montelukast (SINGULAIR) 10 MG tablet, TAKE ONE TABLET BY MOUTH EVERY EVENING, Disp: 90  tablet, Rfl: 0   omeprazole (PRILOSEC) 20 MG capsule, Take 1 capsule (20 mg total) by mouth daily., Disp: 90 capsule, Rfl: 3   ondansetron (ZOFRAN) 4 MG tablet, Take 1 tablet (4 mg total) by mouth every 8 (eight) hours as needed for nausea or vomiting., Disp: 20 tablet, Rfl: 0   Rimegepant Sulfate (NURTEC) 75 MG TBDP, Take 1 tablet by mouth every other day. For migraine prevention, Disp: 16 tablet, Rfl: prn Social History   Socioeconomic History   Marital status: Single    Spouse name: Not on file   Number of children: 2   Years of education: Not on file   Highest education level: GED or equivalent  Occupational History    Comment: disability  Tobacco Use   Smoking status: Former    Years: 20.00    Types: Cigarettes    Quit date: 04/30/1999    Years since quitting: 22.6   Smokeless tobacco: Never  Vaping Use   Vaping Use: Never used  Substance and Sexual  Activity   Alcohol use: No    Alcohol/week: 0.0 standard drinks   Drug use: No   Sexual activity: Not on file  Other Topics Concern   Not on file  Social History Narrative   Lives alone. Her mother lives 15 minutes away. Both children out of town, but daughter visits often.   Social Determinants of Health   Financial Resource Strain: Low Risk    Difficulty of Paying Living Expenses: Not hard at all  Food Insecurity: No Food Insecurity   Worried About Charity fundraiser in the Last Year: Never true   [place] in the Last Year: Never true  Transportation Needs: No Transportation Needs   Lack of Transportation (Medical): No   Lack of Transportation (Non-Medical): No  Physical Activity: Sufficiently Active   Days of Exercise per Week: 6 days   Minutes of Exercise per Session: 60 min  Stress: No Stress Concern Present   Feeling of Stress : Only a little  Social Connections: Moderately Integrated   Frequency of Communication with Friends and Family: More than three times a week   Frequency of Social Gatherings with Friends and Family: More than three times a week   Attends Religious Services: More than 4 times per year   Active Member of [employer] or Organizations: Yes   Attends Music therapist: More than 4 times per year   Marital Status: Divorced  Human resources officer Violence: Not At Risk   Fear of Current or Ex-Partner: No   Emotionally Abused: No   Physically Abused: No   Sexually Abused: No   Family History  Problem Relation Age of Onset   Emphysema Father        died at age 25   COPD Father    Hypertension Mother     Objective: Office vital signs reviewed. BP (!) 162/90    Pulse (!) 59    Temp (!) 97.3 F (36.3 C)    Ht 5' (1.524 m)    Wt 195 lb 9.6 oz (88.7 kg)    SpO2 100%    BMI 38.20 kg/m   Physical Examination:  General: Awake, alert, nontoxic, No acute distress HEENT: Sclera white GU: Suprapubic tenderness and bilateral CVA tenderness  present  Assessment/ Plan: 66 y.o. female   Urinary urgency - Plan: Urinalysis, Complete, Urine Culture, cephALEXin (KEFLEX) 500 MG capsule  Renal stones - Plan: Urinalysis, Complete, Urine Culture, ketorolac (TORADOL) 30 MG/ML  injection 30 mg, cephALEXin (KEFLEX) 500 MG capsule, tamsulosin (FLOMAX) 0.4 MG CAPS capsule, Calculi, with Photograph  Urinalysis with small amounts of bacteria but no nitrites or significant white blood cells.  Sent urine culture.  I do wonder if the symptoms she is experiencing is related to her trying to pass a renal stone.  However, her fibromyalgia confounds this as she had bilateral CVA tenderness today as well as bilateral lower back tenderness.  I am going to place her on Flomax to see if this might make things easier.  Have also given her a stone catcher.  We could send this off for evaluation if she desires.  I am going to empirically treat her for a urinary tract infection once we wait for the urine culture to return.  Toradol injection given for pain.  She understands red flag signs and symptoms that would warrant further evaluation will follow up as needed  No orders of the defined types were placed in this encounter.  No orders of the defined types were placed in this encounter.    Janora Norlander, DO [place] [phone]

## 2022-01-02 ENCOUNTER — Telehealth: Payer: Self-pay | Admitting: Family Medicine

## 2022-01-02 LAB — URINE CULTURE

## 2022-01-02 NOTE — Telephone Encounter (Signed)
Left message advising once Dr Reece Agar has reviewed the results we will call her to review.

## 2022-01-09 DIAGNOSIS — H40033 Anatomical narrow angle, bilateral: Secondary | ICD-10-CM | POA: Diagnosis not present

## 2022-01-09 DIAGNOSIS — H2513 Age-related nuclear cataract, bilateral: Secondary | ICD-10-CM | POA: Diagnosis not present

## 2022-01-15 ENCOUNTER — Other Ambulatory Visit: Payer: Self-pay | Admitting: Family Medicine

## 2022-01-15 DIAGNOSIS — G43809 Other migraine, not intractable, without status migrainosus: Secondary | ICD-10-CM

## 2022-01-15 DIAGNOSIS — M797 Fibromyalgia: Secondary | ICD-10-CM

## 2022-01-15 DIAGNOSIS — I10 Essential (primary) hypertension: Secondary | ICD-10-CM

## 2022-02-12 ENCOUNTER — Other Ambulatory Visit: Payer: Self-pay | Admitting: Family Medicine

## 2022-03-15 ENCOUNTER — Other Ambulatory Visit (HOSPITAL_COMMUNITY)
Admit: 2022-03-15 | Discharge: 2022-03-15 | Disposition: A | Discharge status: Home / Self Care | Payer: PPO | Attending: Urgent Care | Admitting: Urgent Care

## 2022-03-15 ENCOUNTER — Ambulatory Visit
Admission: EM | Admit: 2022-03-15 | Discharge: 2022-03-15 | Disposition: A | Discharge status: Home / Self Care | Payer: PPO | Attending: Urgent Care | Admitting: Urgent Care

## 2022-03-15 DIAGNOSIS — R059 Cough, unspecified: Secondary | ICD-10-CM | POA: Diagnosis not present

## 2022-03-15 DIAGNOSIS — R062 Wheezing: Secondary | ICD-10-CM | POA: Insufficient documentation

## 2022-03-15 DIAGNOSIS — R0602 Shortness of breath: Secondary | ICD-10-CM | POA: Diagnosis not present

## 2022-03-15 DIAGNOSIS — J4530 Mild persistent asthma, uncomplicated: Secondary | ICD-10-CM

## 2022-03-15 DIAGNOSIS — R051 Acute cough: Secondary | ICD-10-CM

## 2022-03-15 DIAGNOSIS — J209 Acute bronchitis, unspecified: Secondary | ICD-10-CM | POA: Diagnosis not present

## 2022-03-15 MED ORDER — METHYLPREDNISOLONE SODIUM SUCC 125 MG IJ SOLR
125.0000 mg | Freq: Once | INTRAMUSCULAR | Status: AC
  Administered 2022-03-15: 125 mg via INTRAMUSCULAR

## 2022-03-15 MED ORDER — PROMETHAZINE-DM 6.25-15 MG/5ML PO SYRP: 5.0000 mL | ORAL_SOLUTION | Freq: Four times a day (QID) | ORAL | 0 refills | Status: DC | PRN

## 2022-03-15 MED ORDER — PREDNISONE 20 MG PO TABS: ORAL_TABLET | ORAL | 0 refills | Status: DC

## 2022-03-15 NOTE — ED Provider Notes (Signed)
?[facility] ? ? ?MRN: [id number] DOB: 1956/07/02 ? ?Subjective:  ? ?Alicia Russo is a 66 y.o. female presenting for 1 week history of sinus congestion, postnasal drainage, shortness of breath, wheezing, chest congestion.  Patient has a history of mild persistent asthma and has been using her breathing treatments every 4 hours.  She has a history of bronchitis and pneumonia.  She is a former smoker.  No oxygen therapy.  No history of diabetes. ? ?No current facility-administered medications for this encounter. ? ?Current Outpatient Medications:  ?  albuterol (PROVENTIL) (2.5 MG/3ML) 0.083% nebulizer solution, USE ONE vial in nebulizer EVERY 6 HOURS AS NEEDED wheezing SHORTNESS OF BREATH, Disp: 240 mL, Rfl: 5 ?  albuterol (VENTOLIN HFA) 108 (90 Base) MCG/ACT inhaler, Inhale 2 puffs into the lungs every 6 (six) hours as needed for wheezing or shortness of breath., Disp: 8 g, Rfl: 0 ?  ALPRAZolam (XANAX) 0.5 MG tablet, Take 0.5-1 tablets (0.25-0.5 mg total) by mouth 2 (two) times daily as needed for anxiety., Disp: 40 tablet, Rfl: 1 ?  aspirin 81 MG EC tablet, Take 81 mg by mouth 2 (two) times daily. , Disp: , Rfl:  ?  Biotin 10 MG TABS, Take 1 tablet by mouth 2 (two) times daily. , Disp: , Rfl:  ?  cetirizine (ZYRTEC) 10 MG tablet, Take 1 tablet (10 mg total) by mouth daily., Disp: 90 tablet, Rfl: 3 ?  Cholecalciferol (VITAMIN D3) 2000 UNITS TABS, Take 1 tablet by mouth 2 (two) times daily., Disp: , Rfl:  ?  Cyanocobalamin (VITAMIN B-12 PO), Take 1 tablet by mouth 2 (two) times daily., Disp: , Rfl:  ?  ezetimibe (ZETIA) 10 MG tablet, TAKE ONE TABLET BY MOUTH EVERY DAY, Disp: 30 tablet, Rfl: 0 ?  ibuprofen (ADVIL) 800 MG tablet, Take 1 tablet (800 mg total) by mouth every 8 (eight) hours as needed for headache or moderate pain., Disp: 90 tablet, Rfl: 0 ?  levothyroxine (SYNTHROID) 137 MCG tablet, Take 1 tablet (137 mcg total) by mouth daily., Disp: 90 tablet, Rfl: 3 ?  losartan (COZAAR) 50 MG tablet,  TAKE ONE TABLET BY MOUTH DAILY, Disp: 30 tablet, Rfl: 0 ?  magnesium 30 MG tablet, Take 30 mg by mouth 2 (two) times daily., Disp: , Rfl:  ?  methocarbamol (ROBAXIN) 500 MG tablet, TAKE 1 TABLET BY MOUTH EVERY 6 HOURS AS NEEDED FOR MUSCLE SPASMS (USE SPARINGLY), Disp: 40 tablet, Rfl: 3 ?  montelukast (SINGULAIR) 10 MG tablet, TAKE ONE TABLET BY MOUTH EVERY EVENING, Disp: 90 tablet, Rfl: 0 ?  omeprazole (PRILOSEC) 20 MG capsule, Take 1 capsule (20 mg total) by mouth daily., Disp: 90 capsule, Rfl: 3 ?  ondansetron (ZOFRAN) 4 MG tablet, Take 1 tablet (4 mg total) by mouth every 8 (eight) hours as needed for nausea or vomiting., Disp: 20 tablet, Rfl: 0 ?  Rimegepant Sulfate (NURTEC) 75 MG TBDP, Take 1 tablet by mouth every other day. For migraine prevention, Disp: 16 tablet, Rfl: prn ?  tamsulosin (FLOMAX) 0.4 MG CAPS capsule, Take 1 capsule (0.4 mg total) by mouth daily., Disp: 30 capsule, Rfl: 3  ? ?Allergies  ?Allergen Reactions  ? Bee Venom   ?  Cant breath, swell up  ? Cherry Flavor Hives  ?    dizziness  ? Citalopram Hydrobromide   ?  Mood swings, gets "really really mean"  ? Statins   ?  Myalgia and muscle pain  ? Tramadol   ?  unknown  ? ? ?  Past Medical History:  ?Diagnosis Date  ? Anxiety   ? Asthma   ? AVM (arteriovenous malformation)   ? Intracranial with seizures  ? Closed fracture of left distal radius   ? Fibromyalgia   ? GERD (gastroesophageal reflux disease)   ? Hyperlipidemia   ? Hypothyroidism   ? Plantar fasciitis of right foot   ? Seizures (HCC)   ? AVM repair  ?  ? ?Past Surgical History:  ?Procedure Laterality Date  ? CHOLECYSTECTOMY    ? CRANIOTOMY    ? for AVM  ? FOOT SURGERY  2016  ? Due to plantar fasciitis  ? ORIF WRIST FRACTURE Left 02/16/2017  ? Procedure: OPEN REDUCTION INTERNAL FIXATION (ORIF) LEFT WRIST FRACTURE;  Surgeon: Sheral Apleyimothy D Murphy, MD;  Location: [facility];  Service: Orthopedics;  Laterality: Left;  ? ? ?Family History  ?Problem Relation Age of Onset  ? Emphysema  Father   ?     died at age 66  ? COPD Father   ? Hypertension Mother   ? ? ?Social History  ? ?Tobacco Use  ? Smoking status: Former  ?  Years: 20.00  ?  Types: Cigarettes  ?  Quit date: 04/30/1999  ?  Years since quitting: 22.8  ? Smokeless tobacco: Never  ?Vaping Use  ? Vaping Use: Never used  ?Substance Use Topics  ? Alcohol use: No  ?  Alcohol/week: 0.0 standard drinks  ? Drug use: No  ? ? ?ROS ? ? ?Objective:  ? ?Vitals: ?BP (!) 155/94 (BP Location: Right Wrist)   Pulse 91   Temp 98.7 ?F (37.1 ?C) (Oral)   Resp (!) 26   SpO2 97%  ? ?Physical Exam ?Constitutional:   ?   General: She is not in acute distress. ?   Appearance: Normal appearance. She is well-developed. She is not ill-appearing, toxic-appearing or diaphoretic.  ?HENT:  ?   Head: Normocephalic and atraumatic.  ?   Nose: Nose normal.  ?   Mouth/Throat:  ?   Mouth: Mucous membranes are moist.  ?Eyes:  ?   General: No scleral icterus.    ?   Right eye: No discharge.     ?   Left eye: No discharge.  ?   Extraocular Movements: Extraocular movements intact.  ?Cardiovascular:  ?   Rate and Rhythm: Normal rate.  ?   Heart sounds: No murmur heard. ?  No friction rub. No gallop.  ?Pulmonary:  ?   Effort: Pulmonary effort is normal. No respiratory distress.  ?   Breath sounds: No stridor. Examination of the right-upper field reveals wheezing and rhonchi. Examination of the left-upper field reveals wheezing and rhonchi. Examination of the right-middle field reveals wheezing and rhonchi. Examination of the left-middle field reveals wheezing and rhonchi. Wheezing (anteriorly) and rhonchi (anteriorly) present. No decreased breath sounds or rales.  ?Chest:  ?   Chest wall: No tenderness.  ?Skin: ?   General: Skin is warm and dry.  ?Neurological:  ?   General: No focal deficit present.  ?   Mental Status: She is alert and oriented to person, place, and time.  ?Psychiatric:     ?   Mood and Affect: Mood normal.     ?   Behavior: Behavior normal.  ? ? ?IM Solu-Medrol  in clinic at a dose of 125 mg. ? ?Assessment and Plan :  ? ?PDMP not reviewed this encounter. ? ?1. Acute bronchitis, unspecified organism   ?2. Wheezing   ?  3. Shortness of breath   ?4. Acute cough   ?5. Mild persistent asthma without complication   ? ?Working diagnosis is acute bronchitis.  IM steroid as above today in clinic, start oral prednisone tomorrow.  Will defer antibiotic use pending radiology over read. Unfortunately, we do not have a radiology technologist on site today and therefore patient was redirected to [facility] for an outpatient x-ray.  Recommended supportive care otherwise.  I will follow-up with patient only if she is shown to have pneumonia on her radiology over read.  Otherwise I will follow-up with patient through MyChart. Counseled patient on potential for adverse effects with medications prescribed/recommended today, ER and return-to-clinic precautions discussed, patient verbalized understanding. ? ? ?  ?Wallis Bamberg, PA-C ?03/15/22 1527 ? ?

## 2022-03-15 NOTE — Discharge Instructions (Signed)
Please head over to [facility] to get an outpatient chest x-ray.  ?

## 2022-03-15 NOTE — ED Triage Notes (Signed)
Pt reports, congestion, shortness of breath, cough and wheezing x 1 day. Nebulizer machine and inhaler gives no relief.  ?

## 2022-03-16 ENCOUNTER — Encounter: Payer: Self-pay | Admitting: Family Medicine

## 2022-03-16 ENCOUNTER — Ambulatory Visit (INDEPENDENT_AMBULATORY_CARE_PROVIDER_SITE_OTHER): Payer: PPO | Admitting: Family Medicine

## 2022-03-16 VITALS — BP 128/82 | HR 57 | Temp 97.5°F | Ht 60.0 in | Wt 191.6 lb

## 2022-03-16 DIAGNOSIS — E7800 Pure hypercholesterolemia, unspecified: Secondary | ICD-10-CM | POA: Diagnosis not present

## 2022-03-16 DIAGNOSIS — E034 Atrophy of thyroid (acquired): Secondary | ICD-10-CM

## 2022-03-16 DIAGNOSIS — F411 Generalized anxiety disorder: Secondary | ICD-10-CM

## 2022-03-16 DIAGNOSIS — J4521 Mild intermittent asthma with (acute) exacerbation: Secondary | ICD-10-CM

## 2022-03-16 DIAGNOSIS — Z0001 Encounter for general adult medical examination with abnormal findings: Secondary | ICD-10-CM

## 2022-03-16 DIAGNOSIS — N200 Calculus of kidney: Secondary | ICD-10-CM | POA: Diagnosis not present

## 2022-03-16 DIAGNOSIS — M810 Age-related osteoporosis without current pathological fracture: Secondary | ICD-10-CM | POA: Diagnosis not present

## 2022-03-16 DIAGNOSIS — F410 Panic disorder [episodic paroxysmal anxiety] without agoraphobia: Secondary | ICD-10-CM

## 2022-03-16 DIAGNOSIS — G43809 Other migraine, not intractable, without status migrainosus: Secondary | ICD-10-CM | POA: Diagnosis not present

## 2022-03-16 DIAGNOSIS — F439 Reaction to severe stress, unspecified: Secondary | ICD-10-CM | POA: Diagnosis not present

## 2022-03-16 DIAGNOSIS — I10 Essential (primary) hypertension: Secondary | ICD-10-CM

## 2022-03-16 DIAGNOSIS — Z0000 Encounter for general adult medical examination without abnormal findings: Secondary | ICD-10-CM

## 2022-03-16 DIAGNOSIS — Z79899 Other long term (current) drug therapy: Secondary | ICD-10-CM | POA: Diagnosis not present

## 2022-03-16 DIAGNOSIS — Z1211 Encounter for screening for malignant neoplasm of colon: Secondary | ICD-10-CM

## 2022-03-16 LAB — BAYER DCA HB A1C WAIVED: HB A1C (BAYER DCA - WAIVED): 5.3 % (ref 4.8–5.6)

## 2022-03-16 MED ORDER — ALPRAZOLAM 0.5 MG PO TABS: 0.2500 mg | ORAL_TABLET | Freq: Two times a day (BID) | ORAL | 1 refills | Status: DC | PRN

## 2022-03-16 MED ORDER — NURTEC 75 MG PO TBDP: 1.0000 | ORAL_TABLET | ORAL | 99 refills | Status: AC

## 2022-03-16 MED ORDER — LOSARTAN POTASSIUM 50 MG PO TABS: 50.0000 mg | ORAL_TABLET | Freq: Every day | ORAL | 3 refills | Status: DC

## 2022-03-16 MED ORDER — MONTELUKAST SODIUM 10 MG PO TABS: 10.0000 mg | ORAL_TABLET | Freq: Every evening | ORAL | 3 refills | Status: DC

## 2022-03-16 MED ORDER — ALBUTEROL SULFATE HFA 108 (90 BASE) MCG/ACT IN AERS: 2.0000 | INHALATION_SPRAY | Freq: Four times a day (QID) | RESPIRATORY_TRACT | 0 refills | Status: DC | PRN

## 2022-03-16 MED ORDER — FLUTICASONE-SALMETEROL 100-50 MCG/ACT IN AEPB: 1.0000 | INHALATION_SPRAY | Freq: Two times a day (BID) | RESPIRATORY_TRACT | 3 refills | Status: DC

## 2022-03-16 MED ORDER — EZETIMIBE 10 MG PO TABS: 10.0000 mg | ORAL_TABLET | Freq: Every day | ORAL | 3 refills | Status: DC

## 2022-03-16 NOTE — Progress Notes (Signed)
? ?Alicia Russo is a 66 y.o. female presents to office today for annual physical exam examination.   ? ?Concerns today include: ?1.  Stress ?Patient reports that she is been having some increased stress surrounding the help of her daughter.  Apparently her daughter is going through some relationship changes and has been harassed by her spouse's previous girlfriend.  She worries about her daughter's safety.  Likely she and her daughter are having fairly good stable relationship and so she has been providing a lot of emotional support to her.  She has been utilizing her alprazolam more frequently than her normal.  It still very rare but she will need a refill.  Does not report any excessive daytime sedation, falls, respiratory depression. ? ?2.  Acute asthma exacerbation ?Patient is currently being treated with prednisone for an acute asthma exacerbation.  She will need a refill on her inhaler.  Not currently on any inhaled corticosteroid but was previously on Breo and Advair.  She would be willing to go on whichever is cheaper.  Tends to have issues during the spring and summer but otherwise does well without inhaler.  No hemoptysis reported.  No fevers reported. ? ?Diet: Fair, Exercise: No structured ?Last eye exam: Due ?Last colonoscopy: Due for Cologuard ?Last mammogram: Up-to-date ?Last pap smear: Needs ?Refills needed today: As above ?Immunizations needed: We will defer pneumococcal due to acute illness ?Immunization History  ?Administered Date(s) Administered  ? Influenza Inj Mdck Quad Pf 09/02/2021  ? Influenza Split 08/28/2020, 09/02/2021  ? Influenza,inj,Quad PF,6+ Mos 09/16/2020  ? Influenza,inj,quad, With Preservative 09/01/2018, 07/27/2019  ? Influenza-Unspecified 10/22/2008, 08/26/2016, 09/22/2017, 08/24/2018, 09/02/2021  ? Moderna Sars-Covid-2 Vaccination 04/08/2020  ? ? ? ?Past Medical History:  ?Diagnosis Date  ? Anxiety   ? Asthma   ? AVM (arteriovenous malformation)   ? Intracranial with seizures   ? Closed fracture of left distal radius   ? Fibromyalgia   ? GERD (gastroesophageal reflux disease)   ? Hyperlipidemia   ? Hypothyroidism   ? Plantar fasciitis of right foot   ? Seizures (HCC)   ? AVM repair  ? ?Social History  ? ?Socioeconomic History  ? Marital status: Single  ?  Spouse name: Not on file  ? Number of children: 2  ? Years of education: Not on file  ? Highest education level: GED or equivalent  ?Occupational History  ?  Comment: disability  ?Tobacco Use  ? Smoking status: Former  ?  Years: 20.00  ?  Types: Cigarettes  ?  Quit date: 04/30/1999  ?  Years since quitting: 22.8  ? Smokeless tobacco: Never  ?Vaping Use  ? Vaping Use: Never used  ?Substance and Sexual Activity  ? Alcohol use: No  ?  Alcohol/week: 0.0 standard drinks  ? Drug use: No  ? Sexual activity: Not Currently  ?Other Topics Concern  ? Not on file  ?Social History Narrative  ? Lives alone. Her mother lives 15 minutes away. Both children out of town, but daughter visits often.  ? ?Social Determinants of Health  ? ?Financial Resource Strain: Low Risk   ? Difficulty of Paying Living Expenses: Not hard at all  ?Food Insecurity: No Food Insecurity  ? Worried About Programme researcher, broadcasting/film/videounning Out of Food in the Last Year: Never true  ? Ran Out of Food in the Last Year: Never true  ?Transportation Needs: No Transportation Needs  ? Lack of Transportation (Medical): No  ? Lack of Transportation (Non-Medical): No  ?Physical Activity: Sufficiently  Active  ? Days of Exercise per Week: 6 days  ? Minutes of Exercise per Session: 60 min  ?Stress: No Stress Concern Present  ? Feeling of Stress : Only a little  ?Social Connections: Moderately Integrated  ? Frequency of Communication with Friends and Family: More than three times a week  ? Frequency of Social Gatherings with Friends and Family: More than three times a week  ? Attends Religious Services: More than 4 times per year  ? Active Member of Clubs or Organizations: Yes  ? Attends Banker Meetings:  More than 4 times per year  ? Marital Status: Divorced  ?Intimate Partner Violence: Not At Risk  ? Fear of Current or Ex-Partner: No  ? Emotionally Abused: No  ? Physically Abused: No  ? Sexually Abused: No  ? ?Past Surgical History:  ?Procedure Laterality Date  ? CHOLECYSTECTOMY    ? CRANIOTOMY    ? for AVM  ? FOOT SURGERY  2016  ? Due to plantar fasciitis  ? ORIF WRIST FRACTURE Left 02/16/2017  ? Procedure: OPEN REDUCTION INTERNAL FIXATION (ORIF) LEFT WRIST FRACTURE;  Surgeon: Sheral Apley, MD;  Location: [facility];  Service: Orthopedics;  Laterality: Left;  ? ?Family History  ?Problem Relation Age of Onset  ? Emphysema Father   ?     died at age 75  ? COPD Father   ? Hypertension Mother   ? ? ?Current Outpatient Medications:  ?  albuterol (PROVENTIL) (2.5 MG/3ML) 0.083% nebulizer solution, USE ONE vial in nebulizer EVERY 6 HOURS AS NEEDED wheezing SHORTNESS OF BREATH, Disp: 240 mL, Rfl: 5 ?  aspirin 81 MG EC tablet, Take 81 mg by mouth 2 (two) times daily. , Disp: , Rfl:  ?  Biotin 10 MG TABS, Take 1 tablet by mouth 2 (two) times daily. , Disp: , Rfl:  ?  cetirizine (ZYRTEC) 10 MG tablet, Take 1 tablet (10 mg total) by mouth daily., Disp: 90 tablet, Rfl: 3 ?  Cholecalciferol (VITAMIN D3) 2000 UNITS TABS, Take 1 tablet by mouth 2 (two) times daily., Disp: , Rfl:  ?  Cyanocobalamin (VITAMIN B-12 PO), Take 1 tablet by mouth 2 (two) times daily., Disp: , Rfl:  ?  fluticasone-salmeterol (ADVAIR) 100-50 MCG/ACT AEPB, Inhale 1 puff into the lungs 2 (two) times daily., Disp: 1 each, Rfl: 3 ?  ibuprofen (ADVIL) 800 MG tablet, Take 1 tablet (800 mg total) by mouth every 8 (eight) hours as needed for headache or moderate pain., Disp: 90 tablet, Rfl: 0 ?  levothyroxine (SYNTHROID) 137 MCG tablet, Take 1 tablet (137 mcg total) by mouth daily., Disp: 90 tablet, Rfl: 3 ?  magnesium 30 MG tablet, Take 30 mg by mouth 2 (two) times daily., Disp: , Rfl:  ?  methocarbamol (ROBAXIN) 500 MG tablet, TAKE 1 TABLET BY  MOUTH EVERY 6 HOURS AS NEEDED FOR MUSCLE SPASMS (USE SPARINGLY), Disp: 40 tablet, Rfl: 3 ?  omeprazole (PRILOSEC) 20 MG capsule, Take 1 capsule (20 mg total) by mouth daily., Disp: 90 capsule, Rfl: 3 ?  ondansetron (ZOFRAN) 4 MG tablet, Take 1 tablet (4 mg total) by mouth every 8 (eight) hours as needed for nausea or vomiting., Disp: 20 tablet, Rfl: 0 ?  predniSONE (DELTASONE) 20 MG tablet, Take 20 mg by mouth daily with breakfast., Disp: , Rfl:  ?  promethazine-dextromethorphan (PROMETHAZINE-DM) 6.25-15 MG/5ML syrup, Take 5 mLs by mouth 4 (four) times daily as needed for cough., Disp: 200 mL, Rfl: 0 ?  tamsulosin (FLOMAX) 0.4 MG CAPS capsule, Take 1 capsule (0.4 mg total) by mouth daily., Disp: 30 capsule, Rfl: 3 ?  albuterol (VENTOLIN HFA) 108 (90 Base) MCG/ACT inhaler, Inhale 2 puffs into the lungs every 6 (six) hours as needed for wheezing or shortness of breath., Disp: 8 g, Rfl: 0 ?  ALPRAZolam (XANAX) 0.5 MG tablet, Take 0.5-1 tablets (0.25-0.5 mg total) by mouth 2 (two) times daily as needed for anxiety., Disp: 40 tablet, Rfl: 1 ?  ezetimibe (ZETIA) 10 MG tablet, Take 1 tablet (10 mg total) by mouth daily., Disp: 90 tablet, Rfl: 3 ?  losartan (COZAAR) 50 MG tablet, Take 1 tablet (50 mg total) by mouth daily., Disp: 90 tablet, Rfl: 3 ?  montelukast (SINGULAIR) 10 MG tablet, Take 1 tablet (10 mg total) by mouth every evening., Disp: 90 tablet, Rfl: 3 ?  Rimegepant Sulfate (NURTEC) 75 MG TBDP, Take 1 tablet by mouth every other day. For migraine prevention, Disp: 16 tablet, Rfl: prn ? ?Allergies  ?Allergen Reactions  ? Bee Venom   ?  Cant breath, swell up  ? Cherry Flavor Hives  ?    dizziness  ? Citalopram Hydrobromide   ?  Mood swings, gets "really really mean"  ? Statins   ?  Myalgia and muscle pain  ? Tramadol   ?  unknown  ?  ? ?ROS: ?Review of Systems ?Pertinent items noted in HPI and remainder of comprehensive ROS otherwise negative.   ? ?Physical exam ?BP 128/82   Pulse (!) 57   Temp (!) 97.5 ?F (36.4  ?C)   Ht 5' (1.524 m)   Wt 191 lb 9.6 oz (86.9 kg)   SpO2 95%   BMI 37.42 kg/m?  ?General appearance: alert, cooperative, appears stated age, no distress, and morbidly obese ?Head: Normocephalic, without o

## 2022-03-16 NOTE — Patient Instructions (Signed)
You had labs performed today.  You will be contacted with the results of the labs once they are available, usually in the next 3 business days for routine lab work.  If you have an active my chart account, they will be released to your MyChart.  If you prefer to have these labs released to you via telephone, please let us know. ? ?If you had a pap smear or biopsy performed, expect to be contacted in about 7-10 days. ?Preventive Care 50 Years and Older, Female ?Preventive care refers to lifestyle choices and visits with your health care provider that can promote health and wellness. Preventive care visits are also called wellness exams. ?What can I expect for my preventive care visit? ?Counseling ?Your health care provider may ask you questions about your: ?Medical history, including: ?Past medical problems. ?Family medical history. ?Pregnancy and menstrual history. ?History of falls. ?Current health, including: ?Memory and ability to understand (cognition). ?Emotional well-being. ?Home life and relationship well-being. ?Sexual activity and sexual health. ?Lifestyle, including: ?Alcohol, nicotine or tobacco, and drug use. ?Access to firearms. ?Diet, exercise, and sleep habits. ?Work and work Statistician. ?Sunscreen use. ?Safety issues such as seatbelt and bike helmet use. ?Physical exam ?Your health care provider will check your: ?Height and weight. These may be used to calculate your BMI (body mass index). BMI is a measurement that tells if you are at a healthy weight. ?Waist circumference. This measures the distance around your waistline. This measurement also tells if you are at a healthy weight and may help predict your risk of certain diseases, such as type 2 diabetes and high blood pressure. ?Heart rate and blood pressure. ?Body temperature. ?Skin for abnormal spots. ?What immunizations do I need? ?Vaccines are usually given at various ages, according to a schedule. Your health care provider will recommend  vaccines for you based on your age, medical history, and lifestyle or other factors, such as travel or where you work. ?What tests do I need? ?Screening ?Your health care provider may recommend screening tests for certain conditions. This may include: ?Lipid and cholesterol levels. ?Hepatitis C test. ?Hepatitis B test. ?HIV (human immunodeficiency virus) test. ?STI (sexually transmitted infection) testing, if you are at risk. ?Lung cancer screening. ?Colorectal cancer screening. ?Diabetes screening. This is done by checking your blood sugar (glucose) after you have not eaten for a while (fasting). ?Mammogram. Talk with your health care provider about how often you should have regular mammograms. ?BRCA-related cancer screening. This may be done if you have a family history of breast, ovarian, tubal, or peritoneal cancers. ?Bone density scan. This is done to screen for osteoporosis. ?Talk with your health care provider about your test results, treatment options, and if necessary, the need for more tests. ?Follow these instructions at home: ?Eating and drinking ? ?Eat a diet that includes fresh fruits and vegetables, whole grains, lean protein, and low-fat dairy products. Limit your intake of foods with high amounts of sugar, saturated fats, and salt. ?Take vitamin and mineral supplements as recommended by your health care provider. ?Do not drink alcohol if your health care provider tells you not to drink. ?If you drink alcohol: ?Limit how much you have to 0-1 drink a day. ?Know how much alcohol is in your drink. In the [place] one drink equals one 12 oz bottle of beer (355 mL), one 5 oz glass of wine (148 mL), or one 1? oz glass of hard liquor (44 mL). ?Lifestyle ?Brush your teeth every morning and night with fluoride  toothpaste. Floss one time each day. ?Exercise for at least 30 minutes 5 or more days each week. ?Do not use any products that contain nicotine or tobacco. These products include cigarettes, chewing  tobacco, and vaping devices, such as e-cigarettes. If you need help quitting, ask your health care provider. ?Do not use drugs. ?If you are sexually active, practice safe sex. Use a condom or other form of protection in order to prevent STIs. ?Take aspirin only as told by your health care provider. Make sure that you understand how much to take and what form to take. Work with your health care provider to find out whether it is safe and beneficial for you to take aspirin daily. ?Ask your health care provider if you need to take a cholesterol-lowering medicine (statin). ?Find healthy ways to manage stress, such as: ?Meditation, yoga, or listening to music. ?Journaling. ?Talking to a trusted person. ?Spending time with friends and family. ?Minimize exposure to UV radiation to reduce your risk of skin cancer. ?Safety ?Always wear your seat belt while driving or riding in a vehicle. ?Do not drive: ?If you have been drinking alcohol. Do not ride with someone who has been drinking. ?When you are tired or distracted. ?While texting. ?If you have been using any mind-altering substances or drugs. ?Wear a helmet and other protective equipment during sports activities. ?If you have firearms in your house, make sure you follow all gun safety procedures. ?What's next? ?Visit your health care provider once a year for an annual wellness visit. ?Ask your health care provider how often you should have your eyes and teeth checked. ?Stay up to date on all vaccines. ?This information is not intended to replace advice given to you by your health care provider. Make sure you discuss any questions you have with your health care provider. ?Document Revised: 05/28/2021 Document Reviewed: 05/28/2021 ?Elsevier Patient Education ? 2022 Elsevier Inc[employer] ? ?

## 2022-03-17 LAB — CBC
Hematocrit: 45.2 % (ref 34.0–46.6)
Hemoglobin: 15 g/dL (ref 11.1–15.9)
MCH: 29.2 pg (ref 26.6–33.0)
MCHC: 33.2 g/dL (ref 31.5–35.7)
MCV: 88 fL (ref 79–97)
Platelets: 360 10*3/uL (ref 150–450)
RBC: 5.14 x10E6/uL (ref 3.77–5.28)
RDW: 14.1 % (ref 11.7–15.4)
WBC: 14.3 10*3/uL — ABNORMAL HIGH (ref 3.4–10.8)

## 2022-03-17 LAB — CMP14+EGFR
ALT: 15 IU/L (ref 0–32)
AST: 12 IU/L (ref 0–40)
Albumin/Globulin Ratio: 1.9 (ref 1.2–2.2)
Albumin: 4 g/dL (ref 3.8–4.8)
Alkaline Phosphatase: 76 IU/L (ref 44–121)
BUN/Creatinine Ratio: 17 (ref 12–28)
BUN: 11 mg/dL (ref 8–27)
Bilirubin Total: 0.3 mg/dL (ref 0.0–1.2)
CO2: 23 mmol/L (ref 20–29)
Calcium: 9.5 mg/dL (ref 8.7–10.3)
Chloride: 105 mmol/L (ref 96–106)
Creatinine, Ser: 0.65 mg/dL (ref 0.57–1.00)
Globulin, Total: 2.1 g/dL (ref 1.5–4.5)
Glucose: 102 mg/dL — ABNORMAL HIGH (ref 70–99)
Potassium: 4.4 mmol/L (ref 3.5–5.2)
Sodium: 145 mmol/L — ABNORMAL HIGH (ref 134–144)
Total Protein: 6.1 g/dL (ref 6.0–8.5)
eGFR: 98 mL/min/{1.73_m2} (ref >59)

## 2022-03-17 LAB — VITAMIN D 25 HYDROXY (VIT D DEFICIENCY, FRACTURES): Vit D, 25-Hydroxy: 95.3 ng/mL (ref 30.0–100.0)

## 2022-03-17 LAB — LIPID PANEL
Chol/HDL Ratio: 3 ratio (ref 0.0–4.4)
Cholesterol, Total: 184 mg/dL (ref 100–199)
HDL: 62 mg/dL (ref >39)
LDL Chol Calc (NIH): 107 mg/dL — ABNORMAL HIGH (ref 0–99)
Triglycerides: 83 mg/dL (ref 0–149)
VLDL Cholesterol Cal: 15 mg/dL (ref 5–40)

## 2022-03-17 LAB — T4, FREE: Free T4: 1.62 ng/dL (ref 0.82–1.77)

## 2022-03-17 LAB — TSH: TSH: 0.109 u[IU]/mL — ABNORMAL LOW (ref 0.450–4.500)

## 2022-03-19 NOTE — Progress Notes (Signed)
Patient calling back. Please call back at [phone] ?

## 2022-03-20 LAB — TOXASSURE SELECT 13 (MW), URINE

## 2022-03-24 ENCOUNTER — Telehealth: Payer: Self-pay | Admitting: Family Medicine

## 2022-03-24 ENCOUNTER — Encounter: Payer: Self-pay | Admitting: Family Medicine

## 2022-03-24 NOTE — Telephone Encounter (Signed)
Key: [id number]  ? ?Your information has been sent to [employer]. ?

## 2022-03-25 ENCOUNTER — Telehealth: Payer: Self-pay

## 2022-03-25 NOTE — Telephone Encounter (Signed)
We have been trying to reach patient since yesterday I called at 11:27 and had to leave a message ?

## 2022-03-26 ENCOUNTER — Encounter: Payer: Self-pay | Admitting: Family Medicine

## 2022-03-27 NOTE — Telephone Encounter (Signed)
Was denied appeal sent to insurance today at 8:13am. ?

## 2022-04-01 NOTE — Telephone Encounter (Signed)
Redetermination notice:  ?Approval of medicare prescription drug coverage ? ?Coverage start: 03/31/22 ?Coverage end: 12/13/22 ? ?Will need a new PA done for coverage after 12/13/22. ? ? ? ?Alicia Russo's pharm aware   ? ? ? ?

## 2022-04-02 ENCOUNTER — Ambulatory Visit (INDEPENDENT_AMBULATORY_CARE_PROVIDER_SITE_OTHER): Payer: PPO

## 2022-04-02 VITALS — Wt 192.0 lb

## 2022-04-02 DIAGNOSIS — Z0000 Encounter for general adult medical examination without abnormal findings: Secondary | ICD-10-CM | POA: Diagnosis not present

## 2022-04-02 NOTE — Progress Notes (Signed)
? ?Subjective:  ? Alicia Russo is a 66 y.o. female who presents for Medicare Annual (Subsequent) preventive examination. ? ?Virtual Visit via Telephone Note ? ?I connected with  [name] on 04/02/22 at  9:45 AM EDT by telephone and verified that I am speaking with the correct person using two identifiers. ? ?Location: ?Patient: Home ?Provider: [facility] ?Persons participating in the virtual visit: patient/Nurse Health Advisor ?  ?I discussed the limitations, risks, security and privacy concerns of performing an evaluation and management service by telephone and the availability of in person appointments. The patient expressed understanding and agreed to proceed. ? ?Interactive audio and video telecommunications were attempted between this nurse and patient, however failed, due to patient having technical difficulties OR patient did not have access to video capability.  We continued and completed visit with audio only. ? ?Some vital signs may be absent or patient reported.  ? ?[name], LPN  ? ?Review of Systems    ? ?Cardiac Risk Factors include: advanced age (>61men, >28 women);obesity (BMI >30kg/m2);dyslipidemia;hypertension;Other (see comment), Risk factor comments: asthma ? ?   ?Objective:  ?  ?Today's Vitals  ? 04/02/22 0947  ?Weight: 192 lb (87.1 kg)  ?PainSc: 2   ? ?Body mass index is 37.5 kg/m?. ? ? ?  04/02/2022  ?  9:58 AM 04/01/2021  ? 10:05 AM 03/26/2020  ?  2:58 PM 04/29/2017  ?  3:48 PM 02/16/2017  ?  9:37 AM 02/15/2017  ? 10:16 AM 02/12/2017  ? 12:15 PM  ?Advanced Directives  ?Does Patient Have a Medical Advance Directive? Yes Yes Yes Yes Yes Yes No  ?Type of Paramedic of [place];Living will [place];Living will Healthcare Power of [place];Living will Living will;Healthcare Power of Attorney Living will;Healthcare Power of Attorney   ?Does patient want to make changes to medical advance directive?   No - Patient declined No -  Patient declined No - Patient declined    ?Copy of [place] in Chart? No - copy requested No - copy requested No - copy requested No - copy requested No - copy requested    ? ? ?Current Medications (verified) ?Outpatient Encounter Medications as of 04/02/2022  ?Medication Sig  ? albuterol (PROVENTIL) (2.5 MG/3ML) 0.083% nebulizer solution USE ONE vial in nebulizer EVERY 6 HOURS AS NEEDED wheezing SHORTNESS OF BREATH  ? albuterol (VENTOLIN HFA) 108 (90 Base) MCG/ACT inhaler Inhale 2 puffs into the lungs every 6 (six) hours as needed for wheezing or shortness of breath.  ? ALPRAZolam (XANAX) 0.5 MG tablet Take 0.5-1 tablets (0.25-0.5 mg total) by mouth 2 (two) times daily as needed for anxiety.  ? aspirin 81 MG EC tablet Take 81 mg by mouth 2 (two) times daily.   ? Biotin 10 MG TABS Take 1 tablet by mouth 2 (two) times daily.   ? cetirizine (ZYRTEC) 10 MG tablet Take 1 tablet (10 mg total) by mouth daily.  ? Cholecalciferol (VITAMIN D3) 2000 UNITS TABS Take 1 tablet by mouth 2 (two) times daily.  ? Cyanocobalamin (VITAMIN B-12 PO) Take 1 tablet by mouth 2 (two) times daily.  ? ezetimibe (ZETIA) 10 MG tablet Take 1 tablet (10 mg total) by mouth daily.  ? fluticasone-salmeterol (ADVAIR) 100-50 MCG/ACT AEPB Inhale 1 puff into the lungs 2 (two) times daily.  ? ibuprofen (ADVIL) 800 MG tablet Take 1 tablet (800 mg total) by mouth every 8 (eight) hours as needed for headache  or moderate pain.  ? levothyroxine (SYNTHROID) 137 MCG tablet Take 1 tablet (137 mcg total) by mouth daily.  ? losartan (COZAAR) 50 MG tablet Take 1 tablet (50 mg total) by mouth daily.  ? magnesium 30 MG tablet Take 30 mg by mouth 2 (two) times daily.  ? methocarbamol (ROBAXIN) 500 MG tablet TAKE 1 TABLET BY MOUTH EVERY 6 HOURS AS NEEDED FOR MUSCLE SPASMS (USE SPARINGLY)  ? montelukast (SINGULAIR) 10 MG tablet Take 1 tablet (10 mg total) by mouth every evening.  ? omeprazole (PRILOSEC) 20 MG capsule Take 1 capsule (20 mg total) by  mouth daily.  ? promethazine-dextromethorphan (PROMETHAZINE-DM) 6.25-15 MG/5ML syrup Take 5 mLs by mouth 4 (four) times daily as needed for cough.  ? Rimegepant Sulfate (NURTEC) 75 MG TBDP Take 1 tablet by mouth every other day. For migraine prevention  ? tamsulosin (FLOMAX) 0.4 MG CAPS capsule Take 1 capsule (0.4 mg total) by mouth daily.  ? [DISCONTINUED] ondansetron (ZOFRAN) 4 MG tablet Take 1 tablet (4 mg total) by mouth every 8 (eight) hours as needed for nausea or vomiting. (Patient not taking: Reported on 04/02/2022)  ? [DISCONTINUED] predniSONE (DELTASONE) 20 MG tablet Take 20 mg by mouth daily with breakfast. (Patient not taking: Reported on 04/02/2022)  ? ?No facility-administered encounter medications on file as of 04/02/2022.  ? ? ?Allergies (verified) ?Bee venom, Cherry flavor, Citalopram hydrobromide, Statins, and Tramadol  ? ?History: ?Past Medical History:  ?Diagnosis Date  ? Anxiety   ? Asthma   ? AVM (arteriovenous malformation)   ? Intracranial with seizures  ? Closed fracture of left distal radius   ? Fibromyalgia   ? GERD (gastroesophageal reflux disease)   ? Hyperlipidemia   ? Hypothyroidism   ? Plantar fasciitis of right foot   ? Seizures ([place])   ? AVM repair  ? ?Past Surgical History:  ?Procedure Laterality Date  ? CHOLECYSTECTOMY    ? CRANIOTOMY    ? for AVM  ? FOOT SURGERY  2016  ? Due to plantar fasciitis  ? ORIF WRIST FRACTURE Left 02/16/2017  ? Procedure: OPEN REDUCTION INTERNAL FIXATION (ORIF) LEFT WRIST FRACTURE;  Surgeon: Renette Butters, MD;  Location: [facility];  Service: Orthopedics;  Laterality: Left;  ? ?Family History  ?Problem Relation Age of Onset  ? Emphysema Father   ?     died at age 50  ? COPD Father   ? Hypertension Mother   ? ?Social History  ? ?Socioeconomic History  ? Marital status: Single  ?  Spouse name: Not on file  ? Number of children: 2  ? Years of education: Not on file  ? Highest education level: GED or equivalent  ?Occupational History  ?   Comment: disability  ?Tobacco Use  ? Smoking status: Former  ?  Years: 20.00  ?  Types: Cigarettes  ?  Quit date: 04/30/1999  ?  Years since quitting: 22.9  ? Smokeless tobacco: Never  ?Vaping Use  ? Vaping Use: Never used  ?Substance and Sexual Activity  ? Alcohol use: No  ?  Alcohol/week: 0.0 standard drinks  ? Drug use: No  ? Sexual activity: Not Currently  ?Other Topics Concern  ? Not on file  ?Social History Narrative  ? Lives alone. Her mother lives 15 minutes away. Both children out of town, but daughter visits often.  ? ?Social Determinants of Health  ? ?Financial Resource Strain: Low Risk   ? Difficulty of Paying Living Expenses: Not hard  at all  ?Food Insecurity: No Food Insecurity  ? Worried About Charity fundraiser in the Last Year: Never true  ? Ran Out of Food in the Last Year: Never true  ?Transportation Needs: No Transportation Needs  ? Lack of Transportation (Medical): No  ? Lack of Transportation (Non-Medical): No  ?Physical Activity: Sufficiently Active  ? Days of Exercise per Week: 6 days  ? Minutes of Exercise per Session: 30 min  ?Stress: No Stress Concern Present  ? Feeling of Stress : Only a little  ?Social Connections: Moderately Integrated  ? Frequency of Communication with Friends and Family: More than three times a week  ? Frequency of Social Gatherings with Friends and Family: More than three times a week  ? Attends Religious Services: More than 4 times per year  ? Active Member of Clubs or Organizations: Yes  ? Attends Archivist Meetings: More than 4 times per year  ? Marital Status: Divorced  ? ? ?Tobacco Counseling ?Counseling given: Not Answered ? ? ?Clinical Intake: ? ?Pre-visit preparation completed: Yes ? ?Pain : No/denies pain ?Pain Score: 2  ? ?  ? ?BMI - recorded: 37.3 ?Nutritional Status: BMI > 30  Obese ?Nutritional Risks: None ?Diabetes: No ? ?How often do you need to have someone help you when you read instructions, pamphlets, or other written materials from your  doctor or pharmacy?: 1 - Never ? ?Diabetic? no ? ?Interpreter Needed?: No ? ?Information entered by :: Ledia Hanford, LPN ? ? ?Activities of Daily Living ? ?  04/02/2022  ?  9:56 AM  ?In your present state of [place]

## 2022-04-02 NOTE — Patient Instructions (Signed)
Alicia Russo , ?Thank you for taking time to come for your Medicare Wellness Visit. I appreciate your ongoing commitment to your health goals. Please review the following plan we discussed and let me know if I can assist you in the future.  ? ?Screening recommendations/referrals: ?Colonoscopy: Done 2007 - Due for Cologuard - return the expired test and request new one ?Mammogram: Done 01/02/2021 - Recommend Repeat annually - Make appointment with [facility] soon (prefers to do this near birthday, so postponing for now) ?Bone Density: Done 10/09/2020 - Repeat every 2 years ?Recommended yearly ophthalmology/optometry visit for glaucoma screening and checkup ?Recommended yearly dental visit for hygiene and checkup ? ?Vaccinations: ?Influenza vaccine: Done 09/02/2021 - Repeat annually ?Pneumococcal vaccine: One done at [facility] - please get the date for [place] and consider Prevnar-20 next ?Tdap vaccine: Due - recommended every 10 years ?Shingles vaccine: Due - Shingrix is 2 doses 2-6 months apart and over 90% effective     ?Covid-19: Done 04/08/2020 - may have caused breathing problems - declines boosters ? ?Advanced directives: Please bring a copy of your health care power of attorney and living will to the office to be added to your chart at your convenience.  ? ?Conditions/risks identified: Aim for 30 minutes of exercise or brisk walking, 6-8 glasses of water, and 5 servings of fruits and vegetables each day.  ? ?Next appointment: Follow up in one year for your annual wellness visit  ? ? ?Preventive Care 6 Years and Older, Female ?Preventive care refers to lifestyle choices and visits with your health care provider that can promote health and wellness. ?What does preventive care include? ?A yearly physical exam. This is also called an annual well check. ?Dental exams once or twice a year. ?Routine eye exams. Ask your health care provider how often you should have your eyes checked. ?Personal lifestyle choices,  including: ?Daily care of your teeth and gums. ?Regular physical activity. ?Eating a healthy diet. ?Avoiding tobacco and drug use. ?Limiting alcohol use. ?Practicing safe sex. ?Taking low-dose aspirin every day. ?Taking vitamin and mineral supplements as recommended by your health care provider. ?What happens during an annual well check? ?The services and screenings done by your health care provider during your annual well check will depend on your age, overall health, lifestyle risk factors, and family history of disease. ?Counseling  ?Your health care provider may ask you questions about your: ?Alcohol use. ?Tobacco use. ?Drug use. ?Emotional well-being. ?Home and relationship well-being. ?Sexual activity. ?Eating habits. ?History of falls. ?Memory and ability to understand (cognition). ?Work and work Astronomer. ?Reproductive health. ?Screening  ?You may have the following tests or measurements: ?Height, weight, and BMI. ?Blood pressure. ?Lipid and cholesterol levels. These may be checked every 5 years, or more frequently if you are over 69 years old. ?Skin check. ?Lung cancer screening. You may have this screening every year starting at age 71 if you have a 30-pack-year history of smoking and currently smoke or have quit within the past 15 years. ?Fecal occult blood test (FOBT) of the stool. You may have this test every year starting at age 48. ?Flexible sigmoidoscopy or colonoscopy. You may have a sigmoidoscopy every 5 years or a colonoscopy every 10 years starting at age 17. ?Hepatitis C blood test. ?Hepatitis B blood test. ?Sexually transmitted disease (STD) testing. ?Diabetes screening. This is done by checking your blood sugar (glucose) after you have not eaten for a while (fasting). You may have this done every 1-3 years. ?Bone density  scan. This is done to screen for osteoporosis. You may have this done starting at age 66. ?Mammogram. This may be done every 1-2 years. Talk to your health care provider  about how often you should have regular mammograms. ?Talk with your health care provider about your test results, treatment options, and if necessary, the need for more tests. ?Vaccines  ?Your health care provider may recommend certain vaccines, such as: ?Influenza vaccine. This is recommended every year. ?Tetanus, diphtheria, and acellular pertussis (Tdap, Td) vaccine. You may need a Td booster every 10 years. ?Zoster vaccine. You may need this after age 53. ?Pneumococcal 13-valent conjugate (PCV13) vaccine. One dose is recommended after age 74. ?Pneumococcal polysaccharide (PPSV23) vaccine. One dose is recommended after age 59. ?Talk to your health care provider about which screenings and vaccines you need and how often you need them. ?This information is not intended to replace advice given to you by your health care provider. Make sure you discuss any questions you have with your health care provider. ?Document Released: 12/27/2015 Document Revised: 08/19/2016 Document Reviewed: 10/01/2015 ?Elsevier Interactive Patient Education ? 2017 [employer] ? ?Fall Prevention in the Home ?Falls can cause injuries. They can happen to people of all ages. There are many things you can do to make your home safe and to help prevent falls. ?What can I do on the outside of my home? ?Regularly fix the edges of walkways and driveways and fix any cracks. ?Remove anything that might make you trip as you walk through a door, such as a raised step or threshold. ?Trim any bushes or trees on the path to your home. ?Use bright outdoor lighting. ?Clear any walking paths of anything that might make someone trip, such as rocks or tools. ?Regularly check to see if handrails are loose or broken. Make sure that both sides of any steps have handrails. ?Any raised decks and porches should have guardrails on the edges. ?Have any leaves, snow, or ice cleared regularly. ?Use sand or salt on walking paths during winter. ?Clean up any spills in  your garage right away. This includes oil or grease spills. ?What can I do in the bathroom? ?Use night lights. ?Install grab bars by the toilet and in the tub and shower. Do not use towel bars as grab bars. ?Use non-skid mats or decals in the tub or shower. ?If you need to sit down in the shower, use a plastic, non-slip stool. ?Keep the floor dry. Clean up any water that spills on the floor as soon as it happens. ?Remove soap buildup in the tub or shower regularly. ?Attach bath mats securely with double-sided non-slip rug tape. ?Do not have throw rugs and other things on the floor that can make you trip. ?What can I do in the bedroom? ?Use night lights. ?Make sure that you have a light by your bed that is easy to reach. ?Do not use any sheets or blankets that are too big for your bed. They should not hang down onto the floor. ?Have a firm chair that has side arms. You can use this for support while you get dressed. ?Do not have throw rugs and other things on the floor that can make you trip. ?What can I do in the kitchen? ?Clean up any spills right away. ?Avoid walking on wet floors. ?Keep items that you use a lot in easy-to-reach places. ?If you need to reach something above you, use a strong step stool that has a grab bar. ?Keep electrical  cords out of the way. ?Do not use floor polish or wax that makes floors slippery. If you must use wax, use non-skid floor wax. ?Do not have throw rugs and other things on the floor that can make you trip. ?What can I do with my stairs? ?Do not leave any items on the stairs. ?Make sure that there are handrails on both sides of the stairs and use them. Fix handrails that are broken or loose. Make sure that handrails are as long as the stairways. ?Check any carpeting to make sure that it is firmly attached to the stairs. Fix any carpet that is loose or worn. ?Avoid having throw rugs at the top or bottom of the stairs. If you do have throw rugs, attach them to the floor with carpet  tape. ?Make sure that you have a light switch at the top of the stairs and the bottom of the stairs. If you do not have them, ask someone to add them for you. ?What else can I do to help prevent falls? ?Wear shoes

## 2022-05-01 ENCOUNTER — Other Ambulatory Visit: Payer: Self-pay | Admitting: Family Medicine

## 2022-05-01 DIAGNOSIS — Z79899 Other long term (current) drug therapy: Secondary | ICD-10-CM

## 2022-05-01 DIAGNOSIS — F411 Generalized anxiety disorder: Secondary | ICD-10-CM

## 2022-06-15 ENCOUNTER — Ambulatory Visit (INDEPENDENT_AMBULATORY_CARE_PROVIDER_SITE_OTHER): Payer: PPO | Admitting: *Deleted

## 2022-06-15 DIAGNOSIS — M810 Age-related osteoporosis without current pathological fracture: Secondary | ICD-10-CM | POA: Diagnosis not present

## 2022-06-15 MED ORDER — DENOSUMAB 60 MG/ML ~~LOC~~ SOSY
60.0000 mg | PREFILLED_SYRINGE | Freq: Once | SUBCUTANEOUS | Status: AC
  Administered 2022-06-15: 60 mg via SUBCUTANEOUS

## 2022-06-24 ENCOUNTER — Ambulatory Visit: Payer: Self-pay | Admitting: *Deleted

## 2022-06-24 NOTE — Chronic Care Management (AMB) (Signed)
  Chronic Care Management   Note  06/24/2022 Name: Alicia Russo MRN: [id number] DOB: March 12, 1956   Patient is stable from RN Care Management perspective or has not recently engaged with the RN Care Manager. I am removing RN Care Manager from Care Team and closing RN Care Management Care Plans. If patient is currently engaged with another CCM team member I will forward this encounter to inform them of my case closure. Patient may be eligible for re-engagement with RN Care Manager in the future if necessary and can discuss this with their PCP.  Demetrios Loll, BSN, RN-BC Embedded Chronic Care Manager [facility] Care Management Direct Dial: [phone]

## 2022-07-27 ENCOUNTER — Telehealth: Payer: PPO | Admitting: Family Medicine

## 2022-07-27 DIAGNOSIS — J069 Acute upper respiratory infection, unspecified: Secondary | ICD-10-CM

## 2022-07-27 MED ORDER — BENZONATATE 100 MG PO CAPS: 100.0000 mg | ORAL_CAPSULE | Freq: Two times a day (BID) | ORAL | 0 refills | Status: DC | PRN

## 2022-07-27 MED ORDER — FLUTICASONE PROPIONATE 50 MCG/ACT NA SUSP: 2.0000 | Freq: Every day | NASAL | 0 refills | Status: DC

## 2022-07-27 NOTE — Progress Notes (Signed)

## 2022-09-01 ENCOUNTER — Other Ambulatory Visit: Payer: Self-pay | Admitting: Family Medicine

## 2022-09-01 DIAGNOSIS — E039 Hypothyroidism, unspecified: Secondary | ICD-10-CM

## 2022-09-15 ENCOUNTER — Ambulatory Visit (INDEPENDENT_AMBULATORY_CARE_PROVIDER_SITE_OTHER): Payer: PPO | Admitting: Family Medicine

## 2022-09-15 ENCOUNTER — Encounter: Payer: Self-pay | Admitting: Family Medicine

## 2022-09-15 VITALS — BP 149/90 | HR 78 | Temp 98.2°F | Ht 60.0 in | Wt 198.6 lb

## 2022-09-15 DIAGNOSIS — N200 Calculus of kidney: Secondary | ICD-10-CM

## 2022-09-15 DIAGNOSIS — M797 Fibromyalgia: Secondary | ICD-10-CM | POA: Diagnosis not present

## 2022-09-15 DIAGNOSIS — D72829 Elevated white blood cell count, unspecified: Secondary | ICD-10-CM | POA: Diagnosis not present

## 2022-09-15 DIAGNOSIS — F410 Panic disorder [episodic paroxysmal anxiety] without agoraphobia: Secondary | ICD-10-CM | POA: Diagnosis not present

## 2022-09-15 DIAGNOSIS — J019 Acute sinusitis, unspecified: Secondary | ICD-10-CM | POA: Diagnosis not present

## 2022-09-15 DIAGNOSIS — E034 Atrophy of thyroid (acquired): Secondary | ICD-10-CM | POA: Diagnosis not present

## 2022-09-15 DIAGNOSIS — R109 Unspecified abdominal pain: Secondary | ICD-10-CM

## 2022-09-15 DIAGNOSIS — Z23 Encounter for immunization: Secondary | ICD-10-CM

## 2022-09-15 DIAGNOSIS — Z1211 Encounter for screening for malignant neoplasm of colon: Secondary | ICD-10-CM

## 2022-09-15 DIAGNOSIS — F411 Generalized anxiety disorder: Secondary | ICD-10-CM

## 2022-09-15 DIAGNOSIS — B9689 Other specified bacterial agents as the cause of diseases classified elsewhere: Secondary | ICD-10-CM

## 2022-09-15 DIAGNOSIS — Z79899 Other long term (current) drug therapy: Secondary | ICD-10-CM

## 2022-09-15 LAB — URINALYSIS, ROUTINE W REFLEX MICROSCOPIC
Bilirubin, UA: NEGATIVE
Glucose, UA: NEGATIVE
Ketones, UA: NEGATIVE
Leukocytes,UA: NEGATIVE
Nitrite, UA: NEGATIVE
Protein,UA: NEGATIVE
Specific Gravity, UA: 1.025 (ref 1.005–1.030)
Urobilinogen, Ur: 0.2 mg/dL (ref 0.2–1.0)
pH, UA: 6 (ref 5.0–7.5)

## 2022-09-15 LAB — MICROSCOPIC EXAMINATION: Renal Epithel, UA: NONE SEEN /hpf

## 2022-09-15 MED ORDER — IBUPROFEN 800 MG PO TABS: 800.0000 mg | ORAL_TABLET | Freq: Three times a day (TID) | ORAL | 0 refills | Status: DC | PRN

## 2022-09-15 MED ORDER — DULOXETINE HCL 30 MG PO CPEP: 30.0000 mg | ORAL_CAPSULE | Freq: Every day | ORAL | 0 refills | Status: DC

## 2022-09-15 MED ORDER — FLUTICASONE PROPIONATE 50 MCG/ACT NA SUSP: 2.0000 | Freq: Every day | NASAL | 0 refills | Status: DC

## 2022-09-15 MED ORDER — CEFDINIR 300 MG PO CAPS: 300.0000 mg | ORAL_CAPSULE | Freq: Two times a day (BID) | ORAL | 0 refills | Status: DC

## 2022-09-15 MED ORDER — FLUCONAZOLE 150 MG PO TABS: 150.0000 mg | ORAL_TABLET | Freq: Once | ORAL | 0 refills | Status: AC

## 2022-09-15 MED ORDER — ALPRAZOLAM 0.5 MG PO TABS: 0.2500 mg | ORAL_TABLET | Freq: Two times a day (BID) | ORAL | 1 refills | Status: DC | PRN

## 2022-09-15 NOTE — Progress Notes (Signed)
Subjective: IW:LNLGX pain PCP: Janora Norlander, DO QJJ:HERD RODA LAUTURE is a 66 y.o. female presenting to clinic today for:  1. Flank pain Patient reports right-sided flank pain.  Initially had been intermittent and mild but it seems to be getting worse.  She has a history of nephrolithiasis such that she had admission to the hospital last November for the same.  She worries about this.  Denies any dysuria, hematuria, increased frequency but reports some sensation of inability to completely evacuate the bladder.  Does not have a urologist.  Has Flomax at home has not yet started taking.  She notes that her flank pain is worse with touching that area or positional changes.  No fevers.  No vomiting.  2.  Fibromyalgia Patient still has not been able to tolerate Savella even at 25 mg doses.  She was previously fine taking this so she is not sure why the changes occurred.  Cannot take gabapentin due to hallucinations but would be certainly willing to trial something else like a tricyclic or Cymbalta  3.  Anxiety disorder Patient reports that anxiety has gotten better.  The issue with her daughter and her daughters husband's ex girlfriend have gotten better.  She is expected to go to court with them later this month but overall her daughter's health is gotten better so she has felt better about things.  She still has about half a bottle of the Xanax available to her but would like to have prescription on file just in case she needs it.  Denies any excessive daytime sedation, falls, respiratory pression, visual or auditory hallucinations.  No memory changes.  4.  Sinusitis Patient reports that she has ongoing sinusitis despite having been faithful with use of Flonase.  She does need a renewal on Flonase but symptoms really have not gotten better.  She reports sinus pressure and drainage.  She has a foul drainage and is worried about secondary bacterial infection.  Again no fevers.  ROS: Per  HPI  Allergies  Allergen Reactions   Bee Venom     Cant breath, swell up   [place]      dizziness   Citalopram Hydrobromide     Mood swings, gets "really really mean"   Statins     Myalgia and muscle pain   Tramadol     unknown   Past Medical History:  Diagnosis Date   Anxiety    Asthma    AVM (arteriovenous malformation)    Intracranial with seizures   Closed fracture of left distal radius    Fibromyalgia    GERD (gastroesophageal reflux disease)    Hyperlipidemia    Hypothyroidism    Plantar fasciitis of right foot    Seizures (HCC)    AVM repair    Current Outpatient Medications:    albuterol (PROVENTIL) (2.5 MG/3ML) 0.083% nebulizer solution, USE ONE vial in nebulizer EVERY 6 HOURS AS NEEDED wheezing SHORTNESS OF BREATH, Disp: 240 mL, Rfl: 5   albuterol (VENTOLIN HFA) 108 (90 Base) MCG/ACT inhaler, Inhale 2 puffs into the lungs every 6 (six) hours as needed for wheezing or shortness of breath., Disp: 8 g, Rfl: 0   aspirin 81 MG EC tablet, Take 81 mg by mouth 2 (two) times daily. , Disp: , Rfl:    benzonatate (TESSALON) 100 MG capsule, Take 1 capsule (100 mg total) by mouth 2 (two) times daily as needed for cough., Disp: 20 capsule, Rfl: 0   Biotin 10 MG TABS,  Take 1 tablet by mouth 2 (two) times daily. , Disp: , Rfl:    cefdinir (OMNICEF) 300 MG capsule, Take 1 capsule (300 mg total) by mouth 2 (two) times daily. 1 po BID, Disp: 20 capsule, Rfl: 0   cetirizine (ZYRTEC) 10 MG tablet, Take 1 tablet (10 mg total) by mouth daily., Disp: 90 tablet, Rfl: 3   Cholecalciferol (VITAMIN D3) 2000 UNITS TABS, Take 1 tablet by mouth 2 (two) times daily., Disp: , Rfl:    Cyanocobalamin (VITAMIN B-12 PO), Take 1 tablet by mouth 2 (two) times daily., Disp: , Rfl:    DULoxetine (CYMBALTA) 30 MG capsule, Take 1 capsule (30 mg total) by mouth daily., Disp: 90 capsule, Rfl: 0   ezetimibe (ZETIA) 10 MG tablet, Take 1 tablet (10 mg total) by mouth daily., Disp: 90 tablet, Rfl: 3    fluconazole (DIFLUCAN) 150 MG tablet, Take 1 tablet (150 mg total) by mouth once for 1 dose., Disp: 1 tablet, Rfl: 0   fluticasone-salmeterol (ADVAIR) 100-50 MCG/ACT AEPB, Inhale 1 puff into the lungs 2 (two) times daily., Disp: 1 each, Rfl: 3   levothyroxine (SYNTHROID) 137 MCG tablet, TAKE ONE TABLET BY MOUTH ONCE DAILY, Disp: 90 tablet, Rfl: 0   losartan (COZAAR) 50 MG tablet, Take 1 tablet (50 mg total) by mouth daily., Disp: 90 tablet, Rfl: 3   magnesium 30 MG tablet, Take 30 mg by mouth 2 (two) times daily., Disp: , Rfl:    methocarbamol (ROBAXIN) 500 MG tablet, TAKE 1 TABLET BY MOUTH EVERY 6 HOURS AS NEEDED FOR MUSCLE SPASMS (USE SPARINGLY), Disp: 40 tablet, Rfl: 3   montelukast (SINGULAIR) 10 MG tablet, Take 1 tablet (10 mg total) by mouth every evening., Disp: 90 tablet, Rfl: 3   omeprazole (PRILOSEC) 20 MG capsule, Take 1 capsule (20 mg total) by mouth daily., Disp: 90 capsule, Rfl: 3   promethazine-dextromethorphan (PROMETHAZINE-DM) 6.25-15 MG/5ML syrup, Take 5 mLs by mouth 4 (four) times daily as needed for cough., Disp: 200 mL, Rfl: 0   Rimegepant Sulfate (NURTEC) 75 MG TBDP, Take 1 tablet by mouth every other day. For migraine prevention, Disp: 16 tablet, Rfl: prn   tamsulosin (FLOMAX) 0.4 MG CAPS capsule, Take 1 capsule (0.4 mg total) by mouth daily., Disp: 30 capsule, Rfl: 3   ALPRAZolam (XANAX) 0.5 MG tablet, Take 0.5-1 tablets (0.25-0.5 mg total) by mouth 2 (two) times daily as needed for anxiety (put on file)., Disp: 40 tablet, Rfl: 1   fluticasone (FLONASE) 50 MCG/ACT nasal spray, Place 2 sprays into both nostrils daily., Disp: 48 g, Rfl: 0   ibuprofen (ADVIL) 800 MG tablet, Take 1 tablet (800 mg total) by mouth every 8 (eight) hours as needed for headache or moderate pain., Disp: 90 tablet, Rfl: 0 Social History   Socioeconomic History   Marital status: Single    Spouse name: Not on file   Number of children: 2   Years of education: Not on file   Highest education level:  GED or equivalent  Occupational History    Comment: disability  Tobacco Use   Smoking status: Former    Years: 20.00    Types: Cigarettes    Quit date: 04/30/1999    Years since quitting: 23.3   Smokeless tobacco: Never  Vaping Use   Vaping Use: Never used  Substance and Sexual Activity   Alcohol use: No    Alcohol/week: 0.0 standard drinks of alcohol   Drug use: No   Sexual activity: Not Currently  Other  Topics Concern   Not on file  Social History Narrative   Lives alone. Her mother lives 15 minutes away. Both children out of town, but daughter visits often.   Social Determinants of Health   Financial Resource Strain: Low Risk  (04/02/2022)   Overall Financial Resource Strain (CARDIA)    Difficulty of Paying Living Expenses: Not hard at all  Food Insecurity: No Food Insecurity (04/02/2022)   Hunger Vital Sign    Worried About Running Out of Food in the Last Year: Never true    Ran Out of Food in the Last Year: Never true  Transportation Needs: No Transportation Needs (04/02/2022)   PRAPARE - Hydrologist (Medical): No    Lack of Transportation (Non-Medical): No  Physical Activity: Sufficiently Active (04/02/2022)   Exercise Vital Sign    Days of Exercise per Week: 6 days    Minutes of Exercise per Session: 30 min  Stress: No Stress Concern Present (04/02/2022)   [place]    Feeling of Stress : Only a little  Social Connections: Moderately Integrated (04/02/2022)   Social Connection and Isolation Panel [NHANES]    Frequency of Communication with Friends and Family: More than three times a week    Frequency of Social Gatherings with Friends and Family: More than three times a week    Attends Religious Services: More than 4 times per year    Active Member of [employer] or Organizations: Yes    Attends Music therapist: More than 4 times per year    Marital Status: Divorced   Intimate Partner Violence: Not At Risk (04/02/2022)   Humiliation, Afraid, Rape, and Kick questionnaire    Fear of Current or Ex-Partner: No    Emotionally Abused: No    Physically Abused: No    Sexually Abused: No   Family History  Problem Relation Age of Onset   Emphysema Father        died at age 33   COPD Father    Hypertension Mother     Objective: Office vital signs reviewed. BP (!) 149/90   Pulse 78   Temp 98.2 F (36.8 C)   Ht 5' (1.524 m)   Wt 198 lb 9.6 oz (90.1 kg)   SpO2 93%   BMI 38.79 kg/m   Physical Examination:  General: Awake, alert, morbidly obese, No acute distress HEENT: Sclera white.  Moist mucous membranes.  No purulence from nares appreciated.  Sinus tenderness present Cardio: regular rate and rhythm, S1S2 heard, no murmurs appreciated Pulm: clear to auscultation bilaterally, no wheezes, rhonchi or rales; normal work of breathing on room air GU: + CVA tenderness to light palpation present. Neuro: No tremor  Assessment/ Plan: 66 y.o. female   Generalized anxiety disorder with panic attacks - Plan: ALPRAZolam (XANAX) 0.5 MG tablet, DULoxetine (CYMBALTA) 30 MG capsule  Hypothyroidism due to acquired atrophy of thyroid - Plan: TSH, T4, Free  Screen for colon cancer - Plan: Cologuard  Leukocytosis, unspecified type - Plan: CBC  Controlled substance agreement signed - Plan: ALPRAZolam (XANAX) 0.5 MG tablet  Acute bacterial sinusitis - Plan: cefdinir (OMNICEF) 300 MG capsule, fluconazole (DIFLUCAN) 150 MG tablet, fluticasone (FLONASE) 50 MCG/ACT nasal spray  Right flank pain - Plan: CT RENAL STONE STUDY, Urinalysis, Routine w reflex microscopic, Urine Culture  Nephrolithiasis - Plan: CT RENAL STONE STUDY, Urinalysis, Routine w reflex microscopic  Fibromyalgia - Plan: DULoxetine (CYMBALTA) 30 MG capsule,  ibuprofen (ADVIL) 800 MG tablet  Start Cymbalta for fibromyalgia.  We will start at low-dose to ensure tolerance.  She knows that she is to  totally discontinue Savella.  Xanax renewed for as needed use.  Up-to-date on UDS and CSC.  Her last UDS was appropriate as her codeine found on there was in fact related to use of cough syrup for URI that she had just gotten over.  Check thyroid levels.  Thyroid was not thoroughly discussed during today's visit because we focus more on the sinus infection, flank pain and fibromyalgia.  Meds have been started for each of these issues and we will follow-up pending the renal stone study and tolerance of Cymbalta  Orders Placed This Encounter  Procedures   Microscopic Examination   Urine Culture   CT RENAL STONE STUDY    Standing Status:   Future    Standing Expiration Date:   09/16/2023    Order Specific Question:   Preferred imaging location?    Answer:   [facility]   Cologuard   TSH   T4, Free   CBC   Urinalysis, Routine w reflex microscopic   Meds ordered this encounter  Medications   ALPRAZolam (XANAX) 0.5 MG tablet    Sig: Take 0.5-1 tablets (0.25-0.5 mg total) by mouth 2 (two) times daily as needed for anxiety (put on file).    Dispense:  40 tablet    Refill:  1   DULoxetine (CYMBALTA) 30 MG capsule    Sig: Take 1 capsule (30 mg total) by mouth daily.    Dispense:  90 capsule    Refill:  0   cefdinir (OMNICEF) 300 MG capsule    Sig: Take 1 capsule (300 mg total) by mouth 2 (two) times daily. 1 po BID    Dispense:  20 capsule    Refill:  0   fluconazole (DIFLUCAN) 150 MG tablet    Sig: Take 1 tablet (150 mg total) by mouth once for 1 dose.    Dispense:  1 tablet    Refill:  0   fluticasone (FLONASE) 50 MCG/ACT nasal spray    Sig: Place 2 sprays into both nostrils daily.    Dispense:  48 g    Refill:  0   ibuprofen (ADVIL) 800 MG tablet    Sig: Take 1 tablet (800 mg total) by mouth every 8 (eight) hours as needed for headache or moderate pain.    Dispense:  90 tablet    Refill:  [place], DO [place] [phone]

## 2022-09-16 LAB — CBC
Hematocrit: 43.1 % (ref 34.0–46.6)
Hemoglobin: 14.3 g/dL (ref 11.1–15.9)
MCH: 29.3 pg (ref 26.6–33.0)
MCHC: 33.2 g/dL (ref 31.5–35.7)
MCV: 88 fL (ref 79–97)
Platelets: 323 10*3/uL (ref 150–450)
RBC: 4.88 x10E6/uL (ref 3.77–5.28)
RDW: 13.6 % (ref 11.7–15.4)
WBC: 10.6 10*3/uL (ref 3.4–10.8)

## 2022-09-16 LAB — TSH: TSH: 0.911 u[IU]/mL (ref 0.450–4.500)

## 2022-09-16 LAB — T4, FREE: Free T4: 1.77 ng/dL (ref 0.82–1.77)

## 2022-09-17 LAB — URINE CULTURE

## 2022-09-18 ENCOUNTER — Ambulatory Visit (HOSPITAL_COMMUNITY)
Admission: RE | Admit: 2022-09-18 | Discharge: 2022-09-18 | Disposition: A | Discharge status: Home / Self Care | Payer: PPO | Source: Ambulatory Visit | Attending: Family Medicine | Admitting: Family Medicine

## 2022-09-18 DIAGNOSIS — K449 Diaphragmatic hernia without obstruction or gangrene: Secondary | ICD-10-CM | POA: Diagnosis not present

## 2022-09-18 DIAGNOSIS — N3289 Other specified disorders of bladder: Secondary | ICD-10-CM | POA: Diagnosis not present

## 2022-09-18 DIAGNOSIS — N200 Calculus of kidney: Secondary | ICD-10-CM | POA: Diagnosis not present

## 2022-09-18 DIAGNOSIS — R109 Unspecified abdominal pain: Secondary | ICD-10-CM | POA: Insufficient documentation

## 2022-09-18 DIAGNOSIS — K5730 Diverticulosis of large intestine without perforation or abscess without bleeding: Secondary | ICD-10-CM | POA: Diagnosis not present

## 2022-09-22 ENCOUNTER — Other Ambulatory Visit: Payer: Self-pay | Admitting: Family Medicine

## 2022-09-22 DIAGNOSIS — N200 Calculus of kidney: Secondary | ICD-10-CM

## 2022-10-14 ENCOUNTER — Encounter: Payer: Self-pay | Admitting: Family Medicine

## 2022-10-14 ENCOUNTER — Ambulatory Visit: Payer: Self-pay | Admitting: Urology

## 2022-10-14 ENCOUNTER — Ambulatory Visit (INDEPENDENT_AMBULATORY_CARE_PROVIDER_SITE_OTHER): Payer: PPO | Admitting: Family Medicine

## 2022-10-14 DIAGNOSIS — J4520 Mild intermittent asthma, uncomplicated: Secondary | ICD-10-CM

## 2022-10-14 MED ORDER — PREDNISONE 20 MG PO TABS: ORAL_TABLET | ORAL | 0 refills | Status: DC

## 2022-10-14 NOTE — Progress Notes (Signed)
Virtual Visit via telephone Note  I connected with Alicia Russo on 10/14/22 at 1339 by telephone and verified that I am speaking with the correct person using two identifiers. Alicia Russo is currently located at home and patient are currently with her during visit. The provider, Fransisca Kaufmann Jeris Easterly, MD is located in their office at time of visit.  Call ended at 1348  I discussed the limitations, risks, security and privacy concerns of performing an evaluation and management service by telephone and the availability of in person appointments. I also discussed with the patient that there may be a patient responsible charge related to this service. The patient expressed understanding and agreed to proceed.   History and Present Illness: Patient is calling in for hoarseness and sore throat and congestion and ill.  She is tired and weak.  She denies sick contacts.  She started yesterday and is taking mucinex-d and its helps some.  She feels like she has sinus pressure.  She has a productive cough. She is taking taking cough syrup.  She has asthma and is on inhalers and neublizer and they do help. She takes zyrtec and singulair and flonase.    1. Mild intermittent asthma without complication     Outpatient Encounter Medications as of 10/14/2022  Medication Sig   predniSONE (DELTASONE) 20 MG tablet 2 po at same time daily for 5 days   albuterol (PROVENTIL) (2.5 MG/3ML) 0.083% nebulizer solution USE ONE vial in nebulizer EVERY 6 HOURS AS NEEDED wheezing SHORTNESS OF BREATH   albuterol (VENTOLIN HFA) 108 (90 Base) MCG/ACT inhaler Inhale 2 puffs into the lungs every 6 (six) hours as needed for wheezing or shortness of breath.   ALPRAZolam (XANAX) 0.5 MG tablet Take 0.5-1 tablets (0.25-0.5 mg total) by mouth 2 (two) times daily as needed for anxiety (put on file).   aspirin 81 MG EC tablet Take 81 mg by mouth 2 (two) times daily.    benzonatate (TESSALON) 100 MG capsule Take 1 capsule (100 mg  total) by mouth 2 (two) times daily as needed for cough.   Biotin 10 MG TABS Take 1 tablet by mouth 2 (two) times daily.    cefdinir (OMNICEF) 300 MG capsule Take 1 capsule (300 mg total) by mouth 2 (two) times daily. 1 po BID   cetirizine (ZYRTEC) 10 MG tablet Take 1 tablet (10 mg total) by mouth daily.   Cholecalciferol (VITAMIN D3) 2000 UNITS TABS Take 1 tablet by mouth 2 (two) times daily.   Cyanocobalamin (VITAMIN B-12 PO) Take 1 tablet by mouth 2 (two) times daily.   DULoxetine (CYMBALTA) 30 MG capsule Take 1 capsule (30 mg total) by mouth daily.   ezetimibe (ZETIA) 10 MG tablet Take 1 tablet (10 mg total) by mouth daily.   fluticasone (FLONASE) 50 MCG/ACT nasal spray Place 2 sprays into both nostrils daily.   fluticasone-salmeterol (ADVAIR) 100-50 MCG/ACT AEPB Inhale 1 puff into the lungs 2 (two) times daily.   ibuprofen (ADVIL) 800 MG tablet Take 1 tablet (800 mg total) by mouth every 8 (eight) hours as needed for headache or moderate pain.   levothyroxine (SYNTHROID) 137 MCG tablet TAKE ONE TABLET BY MOUTH ONCE DAILY   losartan (COZAAR) 50 MG tablet Take 1 tablet (50 mg total) by mouth daily.   magnesium 30 MG tablet Take 30 mg by mouth 2 (two) times daily.   methocarbamol (ROBAXIN) 500 MG tablet TAKE 1 TABLET BY MOUTH EVERY 6 HOURS AS NEEDED FOR MUSCLE SPASMS (  USE SPARINGLY)   montelukast (SINGULAIR) 10 MG tablet Take 1 tablet (10 mg total) by mouth every evening.   omeprazole (PRILOSEC) 20 MG capsule Take 1 capsule (20 mg total) by mouth daily.   promethazine-dextromethorphan (PROMETHAZINE-DM) 6.25-15 MG/5ML syrup Take 5 mLs by mouth 4 (four) times daily as needed for cough.   Rimegepant Sulfate (NURTEC) 75 MG TBDP Take 1 tablet by mouth every other day. For migraine prevention   tamsulosin (FLOMAX) 0.4 MG CAPS capsule Take 1 capsule (0.4 mg total) by mouth daily.   No facility-administered encounter medications on file as of 10/14/2022.    Review of Systems  Constitutional:   Negative for chills and fever.  HENT:  Positive for congestion, postnasal drip, rhinorrhea, sinus pressure, sneezing, sore throat and voice change. Negative for ear discharge and ear pain.   Eyes:  Negative for pain, redness and visual disturbance.  Respiratory:  Positive for cough. Negative for chest tightness and shortness of breath.   Cardiovascular:  Negative for chest pain and leg swelling.  Genitourinary:  Negative for difficulty urinating and dysuria.  Musculoskeletal:  Negative for back pain and gait problem.  Skin:  Negative for rash.  Neurological:  Negative for dizziness, light-headedness and headaches.  Psychiatric/Behavioral:  Negative for agitation and behavioral problems.   All other systems reviewed and are negative.   Observations/Objective: Patient sounds comfortable and in no acute distress  Assessment and Plan: Problem List Items Addressed This Visit       Respiratory   Mild intermittent asthma - Primary   Relevant Medications   predniSONE (DELTASONE) 20 MG tablet    Likely asthma and allergies, will send prednisone and recommend continue with the Mucinex and the cough syrup and that should help her get over this.  Call back if develops any fevers or if worsens but do not think it warrants an antibiotic at this point Follow up plan: Return if symptoms worsen or fail to improve.     I discussed the assessment and treatment plan with the patient. The patient was provided an opportunity to ask questions and all were answered. The patient agreed with the plan and demonstrated an understanding of the instructions.   The patient was advised to call back or seek an in-person evaluation if the symptoms worsen or if the condition fails to improve as anticipated.  The above assessment and management plan was discussed with the patient. The patient verbalized understanding of and has agreed to the management plan. Patient is aware to call the clinic if symptoms persist or  worsen. Patient is aware when to return to the clinic for a follow-up visit. Patient educated on when it is appropriate to go to the emergency department.    I provided 9 minutes of non-face-to-face time during this encounter.    Worthy Rancher, MD

## 2022-10-22 ENCOUNTER — Encounter: Payer: Self-pay | Admitting: Urology

## 2022-10-22 ENCOUNTER — Ambulatory Visit: Payer: Self-pay | Admitting: Urology

## 2022-10-22 ENCOUNTER — Ambulatory Visit: Payer: PPO | Admitting: Urology

## 2022-10-22 VITALS — BP 131/83 | HR 72 | Ht 62.0 in | Wt 198.0 lb

## 2022-10-22 DIAGNOSIS — Z8052 Family history of malignant neoplasm of bladder: Secondary | ICD-10-CM | POA: Diagnosis not present

## 2022-10-22 DIAGNOSIS — N200 Calculus of kidney: Secondary | ICD-10-CM | POA: Diagnosis not present

## 2022-10-22 NOTE — Patient Instructions (Addendum)
Kidney Stones  Kidney stones are solid, rock-like deposits that form inside of the kidneys. The kidneys are a pair of organs that make urine. A kidney stone may form in a kidney and move into other parts of the urinary tract, including the tubes that connect the kidneys to the bladder (ureters), the bladder, and the tube that carries urine out of the body (urethra). As the stone moves through these areas, it can cause intense pain and block the flow of urine. Kidney stones are created when high levels of certain minerals are found in the urine. The stones are usually passed out of the body through urination, but in some cases, medical treatment may be needed to remove them. What are the causes? Kidney stones may be caused by: A condition in which certain glands produce too much parathyroid hormone (primary hyperparathyroidism), which causes too much calcium buildup in the blood. A buildup of uric acid crystals in the bladder (hyperuricosuria). Uric acid is a chemical that the body produces when you eat certain foods. It usually leaves the body in the urine. Narrowing (stricture) of one or both of the ureters. A kidney blockage that is present at birth (congenital obstruction). Past surgery on the kidney or the ureters. What increases the risk? The following factors may make you more likely to develop this condition: Having had a kidney stone in the past. Having a family history of kidney stones. Not drinking enough water. Eating a diet that is high in protein, salt (sodium), or sugar. Being overweight or obese. What are the signs or symptoms? Symptoms of a kidney stone may include: Pain in the side of the abdomen, right below the ribs (flank pain). Pain usually spreads (radiates) to the groin. Needing to urinate often or urgently. Painful urination. Blood in the urine (hematuria). Nausea. Vomiting. Fever and chills. How is this diagnosed? This condition may be diagnosed based on: Your  symptoms and medical history. A physical exam. Blood tests. Urine tests. These may be done before and after the stone passes out of your body through urination. Imaging tests, such as a CT scan, abdominal X-ray, or ultrasound. A procedure to examine the inside of the bladder (cystoscopy). How is this treated? Treatment for kidney stones depends on the size, location, and makeup of the stones. Kidney stones will often pass out of the body through urination. You may need to: Increase your fluid intake to help pass the stone. In some cases, you may be given fluids through an IV and may need to be monitored in the hospital. Take medicine for pain. Make changes in your diet to help prevent kidney stones from coming back. Sometimes, procedures are needed to remove a kidney stone. This may involve: A procedure to break up kidney stones using: A focused beam of light (laser therapy). Shock waves (extracorporeal shock wave lithotripsy). Surgery to remove kidney stones. This may be needed if you have severe pain or have stones that block your urinary tract. Follow these instructions at home: Medicines Take over-the-counter and prescription medicines only as told by your health care provider. Ask your health care provider if the medicine prescribed to you requires you to avoid driving or using heavy machinery. Eating and drinking Drink enough fluid to keep your urine pale yellow. You may be instructed to drink at least 8-10 glasses of water each day. This will help you pass the kidney stone. If directed, change your diet. This may include: Limiting how much sodium you eat. Eating more fruits   and vegetables. Limiting how much animal protein you eat. Animal proteins include red meat, poultry, fish, and eggs. Eating a normal amount of calcium (1,000-1,300 mg per day). Follow instructions from your health care provider about eating or drinking restrictions. General instructions Collect urine samples as  told by your health care provider. You may need to collect a urine sample: 24 hours after you pass the stone. 8-12 weeks after you pass the kidney stone, and every 6-12 months after that. Strain your urine every time you urinate, for as long as directed. Use the strainer that your health care provider recommends. Do not throw out the kidney stone after passing it. Keep the stone so it can be tested by your health care provider. Testing the makeup of your kidney stone may help prevent you from getting kidney stones in the future. Keep all follow-up visits. You may need follow-up X-rays or ultrasounds to make sure that your stone has passed. How is this prevented? To prevent another kidney stone: Drink enough fluid to keep your urine pale yellow. This is the best way to prevent kidney stones. Eat a healthy diet. Follow recommendations from your health care provider about foods to avoid. Recommendations vary depending on the type of kidney stone that you have. You may be instructed to eat a low-protein diet. Maintain a healthy weight. Where to find more information National Kidney Foundation (NKF): www.kidney.org [facility] [facility] (UCF): www.urologyhealth.org Contact a health care provider if: You have pain that gets worse or does not get better with medicine. Get help right away if: You have a fever or chills. You develop severe pain. You develop new abdominal pain. You faint. You are unable to urinate. Summary Kidney stones are solid, rock-like deposits that form inside of the kidneys. Kidney stones can cause nausea, vomiting, blood in the urine, abdominal pain, and the urge to urinate often. Treatment for kidney stones depends on the size, location, and makeup of the stones. Kidney stones will often pass out of the body through urination. Kidney stones can be prevented by drinking enough fluids, eating a healthy diet, and maintaining a healthy weight. This information is not intended  to replace advice given to you by your health care provider. Make sure you discuss any questions you have with your health care provider. Document Revised: 03/11/2022 Document Reviewed: 03/11/2022 Elsevier Patient Education  2023 [employer] Inc[employer]  Dietary Guidelines to Help Prevent Kidney Stones Kidney stones are deposits of minerals and salts that form inside your kidneys. Your risk of developing kidney stones may be greater depending on your diet, your lifestyle, the medicines you take, and whether you have certain medical conditions. Most people can lower their risks of developing kidney stones by following these dietary guidelines. Your dietitian may give you more specific instructions depending on your overall health and the type of kidney stones you tend to develop. What are tips for following this plan? Reading food labels  Choose foods with "no salt added" or "low-salt" labels. Limit your salt (sodium) intake to less than 1,500 mg a day. Choose foods with calcium for each meal and snack. Try to eat about 300 mg of calcium at each meal. Foods that contain 200-500 mg of calcium a serving include: 8 oz (237 mL) of milk, calcium-fortifiednon-dairy milk, and calcium-fortifiedfruit juice. Calcium-fortified means that calcium has been added to these drinks. 8 oz (237 mL) of kefir, yogurt, and soy yogurt. 4 oz (114 g) of tofu. 1 oz (28 g) of cheese. 1 cup (  150 g) of dried figs. 1 cup (91 g) of cooked broccoli. One 3 oz (85 g) can of sardines or mackerel. Most people need 1,000-1,500 mg of calcium a day. Talk to your dietitian about how much calcium is recommended for you. Shopping Buy plenty of fresh fruits and vegetables. Most people do not need to avoid fruits and vegetables, even if these foods contain nutrients that may contribute to kidney stones. When shopping for convenience foods, choose: Whole pieces of fruit. Pre-made salads with dressing on the side. Low-fat fruit and yogurt  smoothies. Avoid buying frozen meals or prepared deli foods. These can be high in sodium. Look for foods with live cultures, such as yogurt and kefir. Choose high-fiber grains, such as whole-wheat breads, oat bran, and wheat cereals. Cooking Do not add salt to food when cooking. Place a salt shaker on the table and allow each person to add their own salt to taste. Use vegetable protein, such as beans, textured vegetable protein (TVP), or tofu, instead of meat in pasta, casseroles, and soups. Meal planning Eat less salt, if told by your dietitian. To do this: Avoid eating processed or pre-made food. Avoid eating fast food. Eat less animal protein, including cheese, meat, poultry, or fish, if told by your dietitian. To do this: Limit the number of times you have meat, poultry, fish, or cheese each week. Eat a diet free of meat at least 2 days a week. Eat only one serving each day of meat, poultry, fish, or seafood. When you prepare animal proteins, cut pieces into small portion sizes. For most meat and fish, one serving is about the size of the palm of your hand. Eat at least five servings of fresh fruits and vegetables each day. To do this: Keep fruits and vegetables on hand for snacks. Eat one piece of fruit or a handful of berries with breakfast. Have a salad and fruit at lunch. Have two kinds of vegetables at dinner. You may be told to limit foods that are high in a substance called oxalate. These include: Spinach (cooked), rhubarb, beets, sweet potatoes, and Swiss chard. Peanuts. Potato chips, french fries, and baked potatoes with skin on. Nuts and nut products. Chocolate. If you regularly take a diuretic medicine, make sure to eat at least 1 or 2 servings of fruits or vegetables that are high in potassium each day. These include: Avocado. Banana. Orange, prune, carrot, or tomato juice. Baked potato. Cabbage. Beans and split peas. Lifestyle  Drink enough fluid to keep your urine  pale yellow. This is the most important thing you can do. Spread your fluid intake throughout the day. If you drink alcohol: Limit how much you have to: 0-1 drink a day for women who are not pregnant. 0-2 drinks a day for men. Know how much alcohol is in your drink. In the [place] one drink equals one 12 oz bottle of beer (355 mL), one 5 oz glass of wine (148 mL), or one 1 oz glass of hard liquor (44 mL). Lose weight if told by your health care provider. Work with your dietitian to find an eating plan and weight loss strategies that work best for you. General information Talk to your health care provider and dietitian about taking daily supplements. Depending on your health and the cause of your kidney stones, you may be told: Do not take high-dose supplements of vitamin C (1,000 mg a day or more). To take a calcium supplement. To take a daily probiotic supplement. To take   other supplements such as magnesium, fish oil, or vitamin B6. Take over-the-counter and prescription medicines only as told by your health care provider. These include supplements. What foods should I limit? Limit your intake of the following foods, or eat them as told by your dietitian. Vegetables Spinach. Rhubarb. Beets. Canned vegetables. Pickles. Olives. Baked potatoes with skin. Grains Wheat bran. Baked goods. Salted crackers. Cereals high in sugar. Meats and other proteins Nuts. Nut butters. Large portions of meat, poultry, or fish. Salted, precooked, or cured meats, such as sausages, meat loaves, and hot dogs. Dairy Cheeses. Beverages Regular soft drinks. Regular vegetable juice. Seasonings and condiments Seasoning blends with salt. Salad dressings. Soy sauce. Ketchup. Barbecue sauce. Other foods Canned soups. Canned pasta sauce. Casseroles. Pizza. Lasagna. Frozen meals. Potato chips. French fries. The items listed above may not be a complete list of foods and beverages you should limit. Contact a dietitian for  more information. What foods should I avoid? Talk to your dietitian about specific foods you should avoid based on the type of kidney stones you have and your overall health. Fruits Grapefruit. The item listed above may not be a complete list of foods and beverages you should avoid. Contact a dietitian for more information. Summary Kidney stones are deposits of minerals and salts that form inside your kidneys. You can lower your risk of kidney stones by making changes to your diet. The most important thing you can do is drink enough fluid. Drink enough fluid to keep your urine pale yellow. Talk to your dietitian about how much calcium you should have each day, and eat less salt and animal protein as told by your dietitian. This information is not intended to replace advice given to you by your health care provider. Make sure you discuss any questions you have with your health care provider. Document Revised: 03/12/2022 Document Reviewed: 03/12/2022 Elsevier Patient Education  2023 Elsevier Inc.  

## 2022-10-22 NOTE — Progress Notes (Signed)
   10/22/22 10:40 AM   [name] 1956/09/06 [id number]  CC: right nephrolithiasis, family history of bladder and kidney cancer  HPI: 66 year old female with a number of comorbidities including fibromyalgia who is referred for a nonobstructing small lower pole right renal stone.  She has a history of 1 spontaneously passed stone previously, 4 mm right-sided stone in November 2022.  She has some chronic right-sided flank and low back pain.  Recent CT on 09/18/2022 shows two 4 mm right lower pole nonobstructing stones with no hydronephrosis or ureteral stones.  She denies any significant urinary symptoms, no hematuria.  She also has a family history of bladder cancer and 1 brother, and kidney cancer and another.   PMH: Past Medical History:  Diagnosis Date   Anxiety    Asthma    AVM (arteriovenous malformation)    Intracranial with seizures   Closed fracture of left distal radius    Fibromyalgia    GERD (gastroesophageal reflux disease)    Hyperlipidemia    Hypothyroidism    Plantar fasciitis of right foot    Seizures (HCC)    AVM repair    Surgical History: Past Surgical History:  Procedure Laterality Date   CHOLECYSTECTOMY     CRANIOTOMY     for AVM   FOOT SURGERY  2016   Due to plantar fasciitis   ORIF WRIST FRACTURE Left 02/16/2017   Procedure: OPEN REDUCTION INTERNAL FIXATION (ORIF) LEFT WRIST FRACTURE;  Surgeon: Sheral Apley, MD;  Location: [facility];  Service: Orthopedics;  Laterality: Left;     Family History: Family History  Problem Relation Age of Onset   Emphysema Father        died at age 75   COPD Father    Hypertension Mother     Social History:  reports that she quit smoking about 23 years ago. Her smoking use included cigarettes. She has never used smokeless tobacco. She reports that she does not drink alcohol and does not use drugs.  Physical Exam: Ht 5\' 2"  (1.575 m)   Wt 198 lb (89.8 kg)   BMI 36.21 kg/m     Constitutional:  Alert and oriented, No acute distress. Cardiovascular: No clubbing, cyanosis, or edema. Respiratory: Normal respiratory effort, no increased work of breathing. GI: Abdomen is soft, nontender, nondistended, no abdominal masses  Laboratory Data: Urinalysis 09/15/2022 benign with no microscopic hematuria  Pertinent Imaging: I have personally viewed and interpreted the CT stone protocol showing two 50mm nonobstructing right renal stones.  Assessment & Plan:   66 year old female with small right-sided nonobstructing renal stones as well as a family history of bladder and kidney cancer.  Regarding her small nonobstructive renal stones, we discussed options including surveillance, shockwave lithotripsy, or ureteroscopy.  Risks and benefits of these procedures were reviewed in detail, and she opts for surveillance which is very reasonable.  Return precautions were discussed, and I recommended NSAIDs and Flomax for any acute stone episodes.  Regarding her family history of bladder and kidney cancer, urinalysis is reassuring with no microscopic hematuria, she is a non-smoker and quit over 20 years ago, and no evidence of suspicious renal lesions on recent CT  RTC 1 year with [facility] prior Continue yearly urinalysis with PCP for screening for microscopic hematuria with her family history of bladder and kidney cancer   71, MD 10/22/2022  [facility] [address] [place], [place] [phone]

## 2022-11-09 NOTE — Addendum Note (Signed)
Addended by: Fara Olden on: 11/09/2022 04:55 PM   Modules accepted: Orders

## 2022-11-16 ENCOUNTER — Ambulatory Visit: Payer: PPO | Admitting: Family Medicine

## 2022-11-26 ENCOUNTER — Other Ambulatory Visit: Payer: Self-pay | Admitting: Family Medicine

## 2022-11-26 DIAGNOSIS — E039 Hypothyroidism, unspecified: Secondary | ICD-10-CM

## 2022-12-08 ENCOUNTER — Ambulatory Visit (INDEPENDENT_AMBULATORY_CARE_PROVIDER_SITE_OTHER): Payer: PPO | Admitting: Family Medicine

## 2022-12-08 ENCOUNTER — Encounter: Payer: Self-pay | Admitting: Family Medicine

## 2022-12-08 VITALS — BP 149/78 | HR 62 | Temp 98.4°F | Ht 62.0 in | Wt 196.8 lb

## 2022-12-08 DIAGNOSIS — F410 Panic disorder [episodic paroxysmal anxiety] without agoraphobia: Secondary | ICD-10-CM

## 2022-12-08 DIAGNOSIS — M797 Fibromyalgia: Secondary | ICD-10-CM

## 2022-12-08 DIAGNOSIS — I10 Essential (primary) hypertension: Secondary | ICD-10-CM | POA: Diagnosis not present

## 2022-12-08 DIAGNOSIS — F411 Generalized anxiety disorder: Secondary | ICD-10-CM

## 2022-12-08 MED ORDER — DULOXETINE HCL 60 MG PO CPEP: 60.0000 mg | ORAL_CAPSULE | Freq: Every day | ORAL | 3 refills | Status: DC

## 2022-12-08 NOTE — Progress Notes (Signed)
Subjective: CC:f/u fibromyalgia PCP: Raliegh Ip, DO GEX:BMWU Alicia Russo is a 66 y.o. female presenting to clinic today for:  1.  Fibromyalgia, generalized anxiety disorder Patient was started on Cymbalta 30 mg daily last visit.  Is been 2 months since she is been on this medication she really has noticed a difference in her fibromyalgia pain.  She is very pleased with how the medication is going.  Still has some breakthrough panic attacks that warrant use of the Xanax but this is still rare.  She notes that her appetite has decreased slightly with Cymbalta but she is not necessarily mad about that.  She has gone down to loose 18 and is pretty happy that she is losing a little weight   ROS: Per HPI  Allergies  Allergen Reactions   Bee Venom     Cant breath, swell up   Cherry Flavor Hives      dizziness   Citalopram Hydrobromide     Mood swings, gets "really really mean"   Gabapentin Other (See Comments)    Hallucinations   Statins     Myalgia and muscle pain   Tramadol     unknown   Past Medical History:  Diagnosis Date   Anxiety    Asthma    AVM (arteriovenous malformation)    Intracranial with seizures   Closed fracture of left distal radius    Fibromyalgia    GERD (gastroesophageal reflux disease)    Hyperlipidemia    Hypothyroidism    Plantar fasciitis of right foot    Seizures (HCC)    AVM repair    Current Outpatient Medications:    albuterol (PROVENTIL) (2.5 MG/3ML) 0.083% nebulizer solution, USE ONE vial in nebulizer EVERY 6 HOURS AS NEEDED wheezing SHORTNESS OF BREATH, Disp: 240 mL, Rfl: 5   albuterol (VENTOLIN HFA) 108 (90 Base) MCG/ACT inhaler, Inhale 2 puffs into the lungs every 6 (six) hours as needed for wheezing or shortness of breath., Disp: 8 g, Rfl: 0   ALPRAZolam (XANAX) 0.5 MG tablet, Take 0.5-1 tablets (0.25-0.5 mg total) by mouth 2 (two) times daily as needed for anxiety (put on file)., Disp: 40 tablet, Rfl: 1   aspirin 81 MG EC tablet,  Take 81 mg by mouth 2 (two) times daily. , Disp: , Rfl:    Biotin 10 MG TABS, Take 1 tablet by mouth 2 (two) times daily. , Disp: , Rfl:    cetirizine (ZYRTEC) 10 MG tablet, Take 1 tablet (10 mg total) by mouth daily., Disp: 90 tablet, Rfl: 3   Cholecalciferol (VITAMIN D3) 2000 UNITS TABS, Take 1 tablet by mouth 2 (two) times daily., Disp: , Rfl:    Cyanocobalamin (VITAMIN B-12 PO), Take 1 tablet by mouth 2 (two) times daily., Disp: , Rfl:    DULoxetine (CYMBALTA) 30 MG capsule, Take 1 capsule (30 mg total) by mouth daily., Disp: 90 capsule, Rfl: 0   ezetimibe (ZETIA) 10 MG tablet, Take 1 tablet (10 mg total) by mouth daily., Disp: 90 tablet, Rfl: 3   fluticasone (FLONASE) 50 MCG/ACT nasal spray, Place 2 sprays into both nostrils daily., Disp: 48 g, Rfl: 0   fluticasone-salmeterol (ADVAIR) 100-50 MCG/ACT AEPB, Inhale 1 puff into the lungs 2 (two) times daily., Disp: 1 each, Rfl: 3   ibuprofen (ADVIL) 800 MG tablet, Take 1 tablet (800 mg total) by mouth every 8 (eight) hours as needed for headache or moderate pain., Disp: 90 tablet, Rfl: 0   levothyroxine (SYNTHROID) 137 MCG tablet,  TAKE ONE TABLET BY MOUTH ONCE DAILY, Disp: 90 tablet, Rfl: 3   losartan (COZAAR) 50 MG tablet, Take 1 tablet (50 mg total) by mouth daily., Disp: 90 tablet, Rfl: 3   magnesium 30 MG tablet, Take 30 mg by mouth 2 (two) times daily., Disp: , Rfl:    methocarbamol (ROBAXIN) 500 MG tablet, TAKE 1 TABLET BY MOUTH EVERY 6 HOURS AS NEEDED FOR MUSCLE SPASMS (USE SPARINGLY), Disp: 40 tablet, Rfl: 3   montelukast (SINGULAIR) 10 MG tablet, Take 1 tablet (10 mg total) by mouth every evening., Disp: 90 tablet, Rfl: 3   omeprazole (PRILOSEC) 20 MG capsule, Take 1 capsule (20 mg total) by mouth daily., Disp: 90 capsule, Rfl: 3   Rimegepant Sulfate (NURTEC) 75 MG TBDP, Take 1 tablet by mouth every other day. For migraine prevention, Disp: 16 tablet, Rfl: prn   tamsulosin (FLOMAX) 0.4 MG CAPS capsule, Take 1 capsule (0.4 mg total) by  mouth daily., Disp: 30 capsule, Rfl: 3 Social History   Socioeconomic History   Marital status: Single    Spouse name: Not on file   Number of children: 2   Years of education: Not on file   Highest education level: GED or equivalent  Occupational History    Comment: disability  Tobacco Use   Smoking status: Former    Years: 20.00    Types: Cigarettes    Quit date: 04/30/1999    Years since quitting: 23.6   Smokeless tobacco: Never  Vaping Use   Vaping Use: Never used  Substance and Sexual Activity   Alcohol use: No    Alcohol/week: 0.0 standard drinks of alcohol   Drug use: No   Sexual activity: Not Currently  Other Topics Concern   Not on file  Social History Narrative   Lives alone. Her mother lives 15 minutes away. Both children out of town, but daughter visits often.   Social Determinants of Health   Financial Resource Strain: Low Risk  (04/02/2022)   Overall Financial Resource Strain (CARDIA)    Difficulty of Paying Living Expenses: Not hard at all  Food Insecurity: No Food Insecurity (04/02/2022)   Hunger Vital Sign    Worried About Running Out of Food in the Last Year: Never true    Ran Out of Food in the Last Year: Never true  Transportation Needs: No Transportation Needs (04/02/2022)   PRAPARE - Administrator, Civil Service (Medical): No    Lack of Transportation (Non-Medical): No  Physical Activity: Sufficiently Active (04/02/2022)   Exercise Vital Sign    Days of Exercise per Week: 6 days    Minutes of Exercise per Session: 30 min  Stress: No Stress Concern Present (04/02/2022)   [employer] of Occupational Health - Occupational Stress Questionnaire    Feeling of Stress : Only a little  Social Connections: Moderately Integrated (04/02/2022)   Social Connection and Isolation Panel [NHANES]    Frequency of Communication with Friends and Family: More than three times a week    Frequency of Social Gatherings with Friends and Family: More than  three times a week    Attends Religious Services: More than 4 times per year    Active Member of [employer] or Organizations: Yes    Attends Banker Meetings: More than 4 times per year    Marital Status: Divorced  Intimate Partner Violence: Not At Risk (04/02/2022)   Humiliation, Afraid, Rape, and Kick questionnaire    Fear of Current or Ex-Partner:  No    Emotionally Abused: No    Physically Abused: No    Sexually Abused: No   Family History  Problem Relation Age of Onset   Emphysema Father        died at age 43   COPD Father    Hypertension Mother     Objective: Office vital signs reviewed. BP (!) 149/78   Pulse 62   Temp 98.4 F (36.9 C)   Ht 5\' 2"  (1.575 m)   Wt 196 lb 12.8 oz (89.3 kg)   SpO2 97%   BMI 36.00 kg/m   Physical Examination:  General: Awake, alert, obese, No acute distress HEENT: sclera white, MMM Cardio: regular rate and rhythm, S1S2 heard, no murmurs appreciated Pulm: clear to auscultation bilaterally, no wheezes, rhonchi or rales; normal work of breathing on room air MSK: Ambulating independently Psych: Mood stable, speech normal.  Very pleasant, interactive    09/15/2022    3:08 PM 04/02/2022    9:54 AM 03/16/2022   11:10 AM  Depression screen PHQ 2/9  Decreased Interest 0 0 0  Down, Depressed, Hopeless 0 0 0  PHQ - 2 Score 0 0 0      09/15/2022    3:08 PM 03/16/2022   11:10 AM 12/31/2021   11:55 AM 09/16/2021    2:53 PM  GAD 7 : Generalized Anxiety Score  Nervous, Anxious, on Edge 0 0 1 1  Control/stop worrying 0 0 0 0  Worry too much - different things 0 0 0 0  Trouble relaxing 0 0 1 1  Restless 0 0 0 1  Easily annoyed or irritable 0 0 0 1  Afraid - awful might happen 0 0 0 0  Total GAD 7 Score 0 0 2 4  Anxiety Difficulty Not difficult at all Not difficult at all Not difficult at all Not difficult at all     Assessment/ Plan: 66 y.o. female   Fibromyalgia - Plan: DULoxetine (CYMBALTA) 60 MG capsule  Generalized anxiety  disorder with panic attacks - Plan: DULoxetine (CYMBALTA) 60 MG capsule  Essential hypertension  Doing really well with Cymbalta 30 mg.  Still having some breakthrough panic attacks and will advance to 60 mg.  She will contact me if she needs to de-escalate back to 30 mg for any reason.  Defer second shingles vaccination and Tdap to next visit  Blood pressure is not at goal.  Would like to see her back in 3 to 4 months rather than 6 months.  No orders of the defined types were placed in this encounter.  No orders of the defined types were placed in this encounter.    71, DO [facility] [phone]

## 2022-12-18 ENCOUNTER — Other Ambulatory Visit: Payer: Self-pay | Admitting: Family Medicine

## 2022-12-18 DIAGNOSIS — I10 Essential (primary) hypertension: Secondary | ICD-10-CM

## 2022-12-18 DIAGNOSIS — E7800 Pure hypercholesterolemia, unspecified: Secondary | ICD-10-CM

## 2022-12-25 ENCOUNTER — Other Ambulatory Visit: Payer: Self-pay | Admitting: Family Medicine

## 2022-12-25 MED ORDER — DENOSUMAB 60 MG/ML ~~LOC~~ SOSY: 60.0000 mg | PREFILLED_SYRINGE | Freq: Once | SUBCUTANEOUS | 0 refills | Status: AC

## 2022-12-25 NOTE — Telephone Encounter (Signed)
Patient aware to check with the pharmacy on the price of Prolia. Rx sent to [facility]. Patient will call us back and schedule once she checks with pharmacy.

## 2022-12-28 ENCOUNTER — Telehealth: Payer: Self-pay | Admitting: Family Medicine

## 2022-12-28 MED ORDER — DENOSUMAB 60 MG/ML ~~LOC~~ SOSY: 60.0000 mg | PREFILLED_SYRINGE | SUBCUTANEOUS | 0 refills | Status: AC

## 2022-12-28 NOTE — Telephone Encounter (Signed)
Message left for patient that rx sent to [facility] and she is due for her Dexa and that Dr. Darnell Level will complete at her next appointment.

## 2022-12-29 ENCOUNTER — Ambulatory Visit (INDEPENDENT_AMBULATORY_CARE_PROVIDER_SITE_OTHER): Payer: PPO

## 2022-12-29 DIAGNOSIS — M810 Age-related osteoporosis without current pathological fracture: Secondary | ICD-10-CM

## 2022-12-29 MED ORDER — DENOSUMAB 60 MG/ML ~~LOC~~ SOSY
60.0000 mg | PREFILLED_SYRINGE | Freq: Once | SUBCUTANEOUS | Status: AC
  Administered 2022-12-29: 60 mg via SUBCUTANEOUS

## 2022-12-29 NOTE — Progress Notes (Signed)
Prolia injection given to left arm.  Patient tolerated well. 

## 2022-12-30 NOTE — Telephone Encounter (Signed)
Patient received Prolia on 01/16

## 2023-01-04 ENCOUNTER — Other Ambulatory Visit (HOSPITAL_COMMUNITY): Payer: Self-pay

## 2023-01-18 ENCOUNTER — Other Ambulatory Visit: Payer: Self-pay | Admitting: Family Medicine

## 2023-01-18 DIAGNOSIS — J4521 Mild intermittent asthma with (acute) exacerbation: Secondary | ICD-10-CM

## 2023-01-30 DIAGNOSIS — H40033 Anatomical narrow angle, bilateral: Secondary | ICD-10-CM | POA: Diagnosis not present

## 2023-01-30 DIAGNOSIS — H2513 Age-related nuclear cataract, bilateral: Secondary | ICD-10-CM | POA: Diagnosis not present

## 2023-03-24 ENCOUNTER — Other Ambulatory Visit: Payer: Self-pay | Admitting: Family Medicine

## 2023-03-24 DIAGNOSIS — I10 Essential (primary) hypertension: Secondary | ICD-10-CM

## 2023-03-24 DIAGNOSIS — E7800 Pure hypercholesterolemia, unspecified: Secondary | ICD-10-CM

## 2023-04-06 ENCOUNTER — Ambulatory Visit (INDEPENDENT_AMBULATORY_CARE_PROVIDER_SITE_OTHER): Payer: PPO

## 2023-04-06 VITALS — Ht 60.0 in | Wt 194.0 lb

## 2023-04-06 DIAGNOSIS — Z0000 Encounter for general adult medical examination without abnormal findings: Secondary | ICD-10-CM

## 2023-04-06 DIAGNOSIS — Z1231 Encounter for screening mammogram for malignant neoplasm of breast: Secondary | ICD-10-CM | POA: Diagnosis not present

## 2023-04-06 DIAGNOSIS — Z1211 Encounter for screening for malignant neoplasm of colon: Secondary | ICD-10-CM | POA: Diagnosis not present

## 2023-04-06 NOTE — Patient Instructions (Signed)
[name] , Thank you for taking time to come for your Medicare Wellness Visit. I appreciate your ongoing commitment to your health goals. Please review the following plan we discussed and let me know if I can assist you in the future.   These are the goals we discussed:  Goals      DIET - EAT MORE FRUITS AND VEGETABLES     Exercise 150 min/wk Moderate Activity        This is a list of the screening recommended for you and due dates:  Health Maintenance  Topic Date Due   DTaP/Tdap/Td vaccine (1 - Tdap) Never done   COVID-19 Vaccine (2 - Moderna risk series) 05/06/2020   Zoster (Shingles) Vaccine (2 of 2) 11/10/2022   Mammogram  01/02/2023   Pneumonia Vaccine (1 of 1 - PCV) 09/15/2023*   Cologuard (Stool DNA test)  09/15/2023*   DEXA scan (bone density measurement)  10/22/2023*   Flu Shot  07/15/2023   Medicare Annual Wellness Visit  04/05/2024   Hepatitis C Screening: USPSTF Recommendation to screen - Ages 18-79 yo.  Completed   HPV Vaccine  Aged Out  *Topic was postponed. The date shown is not the original due date.    Advanced directives: Please bring a copy of your health care power of attorney and living will to the office to be added to your chart at your convenience.   Conditions/risks identified: Aim for 30 minutes of exercise or brisk walking, 6-8 glasses of water, and 5 servings of fruits and vegetables each day.   Next appointment: Follow up in one year for your annual wellness visit    Preventive Care 65 Years and Older, Female Preventive care refers to lifestyle choices and visits with your health care provider that can promote health and wellness. What does preventive care include? A yearly physical exam. This is also called an annual well check. Dental exams once or twice a year. Routine eye exams. Ask your health care provider how often you should have your eyes checked. Personal lifestyle choices, including: Daily care of your teeth and gums. Regular  physical activity. Eating a healthy diet. Avoiding tobacco and drug use. Limiting alcohol use. Practicing safe sex. Taking low-dose aspirin every day. Taking vitamin and mineral supplements as recommended by your health care provider. What happens during an annual well check? The services and screenings done by your health care provider during your annual well check will depend on your age, overall health, lifestyle risk factors, and family history of disease. Counseling  Your health care provider may ask you questions about your: Alcohol use. Tobacco use. Drug use. Emotional well-being. Home and relationship well-being. Sexual activity. Eating habits. History of falls. Memory and ability to understand (cognition). Work and work Astronomer. Reproductive health. Screening  You may have the following tests or measurements: Height, weight, and BMI. Blood pressure. Lipid and cholesterol levels. These may be checked every 5 years, or more frequently if you are over 65 years old. Skin check. Lung cancer screening. You may have this screening every year starting at age 52 if you have a 30-pack-year history of smoking and currently smoke or have quit within the past 15 years. Fecal occult blood test (FOBT) of the stool. You may have this test every year starting at age 15. Flexible sigmoidoscopy or colonoscopy. You may have a sigmoidoscopy every 5 years or a colonoscopy every 10 years starting at age 68. Hepatitis C blood test. Hepatitis B blood test. Sexually transmitted  disease (STD) testing. Diabetes screening. This is done by checking your blood sugar (glucose) after you have not eaten for a while (fasting). You may have this done every 1-3 years. Bone density scan. This is done to screen for osteoporosis. You may have this done starting at age 44. Mammogram. This may be done every 1-2 years. Talk to your health care provider about how often you should have regular mammograms. Talk  with your health care provider about your test results, treatment options, and if necessary, the need for more tests. Vaccines  Your health care provider may recommend certain vaccines, such as: Influenza vaccine. This is recommended every year. Tetanus, diphtheria, and acellular pertussis (Tdap, Td) vaccine. You may need a Td booster every 10 years. Zoster vaccine. You may need this after age 53. Pneumococcal 13-valent conjugate (PCV13) vaccine. One dose is recommended after age 61. Pneumococcal polysaccharide (PPSV23) vaccine. One dose is recommended after age 58. Talk to your health care provider about which screenings and vaccines you need and how often you need them. This information is not intended to replace advice given to you by your health care provider. Make sure you discuss any questions you have with your health care provider. Document Released: 12/27/2015 Document Revised: 08/19/2016 Document Reviewed: 10/01/2015 Elsevier Interactive Patient Education  2017 [place] Prevention in the Home Falls can cause injuries. They can happen to people of all ages. There are many things you can do to make your home safe and to help prevent falls. What can I do on the outside of my home? Regularly fix the edges of walkways and driveways and fix any cracks. Remove anything that might make you trip as you walk through a door, such as a raised step or threshold. Trim any bushes or trees on the path to your home. Use bright outdoor lighting. Clear any walking paths of anything that might make someone trip, such as rocks or tools. Regularly check to see if handrails are loose or broken. Make sure that both sides of any steps have handrails. Any raised decks and porches should have guardrails on the edges. Have any leaves, snow, or ice cleared regularly. Use sand or salt on walking paths during winter. Clean up any spills in your garage right away. This includes oil or grease  spills. What can I do in the bathroom? Use night lights. Install grab bars by the toilet and in the tub and shower. Do not use towel bars as grab bars. Use non-skid mats or decals in the tub or shower. If you need to sit down in the shower, use a plastic, non-slip stool. Keep the floor dry. Clean up any water that spills on the floor as soon as it happens. Remove soap buildup in the tub or shower regularly. Attach bath mats securely with double-sided non-slip rug tape. Do not have throw rugs and other things on the floor that can make you trip. What can I do in the bedroom? Use night lights. Make sure that you have a light by your bed that is easy to reach. Do not use any sheets or blankets that are too big for your bed. They should not hang down onto the floor. Have a firm chair that has side arms. You can use this for support while you get dressed. Do not have throw rugs and other things on the floor that can make you trip. What can I do in the kitchen? Clean up any spills right away. Avoid walking  on wet floors. Keep items that you use a lot in easy-to-reach places. If you need to reach something above you, use a strong step stool that has a grab bar. Keep electrical cords out of the way. Do not use floor polish or wax that makes floors slippery. If you must use wax, use non-skid floor wax. Do not have throw rugs and other things on the floor that can make you trip. What can I do with my stairs? Do not leave any items on the stairs. Make sure that there are handrails on both sides of the stairs and use them. Fix handrails that are broken or loose. Make sure that handrails are as long as the stairways. Check any carpeting to make sure that it is firmly attached to the stairs. Fix any carpet that is loose or worn. Avoid having throw rugs at the top or bottom of the stairs. If you do have throw rugs, attach them to the floor with carpet tape. Make sure that you have a light switch at the  top of the stairs and the bottom of the stairs. If you do not have them, ask someone to add them for you. What else can I do to help prevent falls? Wear shoes that: Do not have high heels. Have rubber bottoms. Are comfortable and fit you well. Are closed at the toe. Do not wear sandals. If you use a stepladder: Make sure that it is fully opened. Do not climb a closed stepladder. Make sure that both sides of the stepladder are locked into place. Ask someone to hold it for you, if possible. Clearly mark and make sure that you can see: Any grab bars or handrails. First and last steps. Where the edge of each step is. Use tools that help you move around (mobility aids) if they are needed. These include: Canes. Walkers. Scooters. Crutches. Turn on the lights when you go into a dark area. Replace any light bulbs as soon as they burn out. Set up your furniture so you have a clear path. Avoid moving your furniture around. If any of your floors are uneven, fix them. If there are any pets around you, be aware of where they are. Review your medicines with your doctor. Some medicines can make you feel dizzy. This can increase your chance of falling. Ask your doctor what other things that you can do to help prevent falls. This information is not intended to replace advice given to you by your health care provider. Make sure you discuss any questions you have with your health care provider. Document Released: 09/26/2009 Document Revised: 05/07/2016 Document Reviewed: 01/04/2015 Elsevier Interactive Patient Education  2017 [employer].

## 2023-04-06 NOTE — Progress Notes (Signed)
Subjective:   Alicia Russo is a 67 y.o. female who presents for Medicare Annual (Subsequent) preventive examination. I connected with  [name] on 04/06/23 by a audio enabled telemedicine application and verified that I am speaking with the correct person using two identifiers.  Patient Location: Home  Provider Location: Home Office  I discussed the limitations of evaluation and management by telemedicine. The patient expressed understanding and agreed to proceed.  Review of Systems     Cardiac Risk Factors include: advanced age (>16men, >43 women);hypertension;dyslipidemia     Objective:    Today's Vitals   04/06/23 0950  Weight: 194 lb (88 kg)  Height: 5' (1.524 m)   Body mass index is 37.89 kg/m.     04/06/2023    9:53 AM 04/02/2022    9:58 AM 04/01/2021   10:05 AM 03/26/2020    2:58 PM 04/29/2017    3:48 PM 02/16/2017    9:37 AM 02/15/2017   10:16 AM  Advanced Directives  Does Patient Have a Medical Advance Directive? Yes Yes Yes Yes Yes Yes Yes  Type of Estate agent of [place];Living will Healthcare Power of [place];Living will Healthcare Power of [place];Living will Healthcare Power of [employer] of [place];Living will Living will;Healthcare Power of Attorney Living will;Healthcare Power of Attorney  Does patient want to make changes to medical advance directive?    No - Patient declined No - Patient declined No - Patient declined   Copy of Healthcare Power of Attorney in Chart? No - copy requested No - copy requested No - copy requested No - copy requested No - copy requested No - copy requested     Current Medications (verified) Outpatient Encounter Medications as of 04/06/2023  Medication Sig   albuterol (PROVENTIL) (2.5 MG/3ML) 0.083% nebulizer solution USE ONE vial in nebulizer EVERY 6 HOURS AS NEEDED wheezing SHORTNESS OF BREATH   albuterol (VENTOLIN HFA) 108 (90 Base) MCG/ACT inhaler INHALE TWO PUFFS INTO THE LUNGS  EVERY 6 HOURS AS NEEDED FOR WHEEZING OR SHORTNESS OF BREATH   ALPRAZolam (XANAX) 0.5 MG tablet Take 0.5-1 tablets (0.25-0.5 mg total) by mouth 2 (two) times daily as needed for anxiety (put on file).   aspirin 81 MG EC tablet Take 81 mg by mouth 2 (two) times daily.    Biotin 10 MG TABS Take 1 tablet by mouth 2 (two) times daily.    cetirizine (ZYRTEC) 10 MG tablet Take 1 tablet (10 mg total) by mouth daily.   Cholecalciferol (VITAMIN D3) 2000 UNITS TABS Take 1 tablet by mouth 2 (two) times daily.   Cyanocobalamin (VITAMIN B-12 PO) Take 1 tablet by mouth 2 (two) times daily.   denosumab (PROLIA) 60 MG/ML SOSY injection Inject 60 mg into the skin every 6 (six) months.   DULoxetine (CYMBALTA) 60 MG capsule Take 1 capsule (60 mg total) by mouth daily.   ezetimibe (ZETIA) 10 MG tablet TAKE ONE TABLET BY MOUTH DAILY   fluticasone (FLONASE) 50 MCG/ACT nasal spray Place 2 sprays into both nostrils daily.   fluticasone-salmeterol (ADVAIR) 100-50 MCG/ACT AEPB Inhale 1 puff into the lungs 2 (two) times daily.   ibuprofen (ADVIL) 800 MG tablet Take 1 tablet (800 mg total) by mouth every 8 (eight) hours as needed for headache or moderate pain.   levothyroxine (SYNTHROID) 137 MCG tablet TAKE ONE TABLET BY MOUTH ONCE DAILY   losartan (COZAAR) 50 MG tablet TAKE ONE TABLET BY MOUTH DAILY   magnesium 30 MG tablet Take  30 mg by mouth 2 (two) times daily.   methocarbamol (ROBAXIN) 500 MG tablet TAKE 1 TABLET BY MOUTH EVERY 6 HOURS AS NEEDED FOR MUSCLE SPASMS (USE SPARINGLY)   montelukast (SINGULAIR) 10 MG tablet TAKE ONE TABLET BY MOUTH EVERY EVENING   omeprazole (PRILOSEC) 20 MG capsule Take 1 capsule (20 mg total) by mouth daily.   Rimegepant Sulfate (NURTEC) 75 MG TBDP Take 1 tablet by mouth every other day. For migraine prevention   tamsulosin (FLOMAX) 0.4 MG CAPS capsule Take 1 capsule (0.4 mg total) by mouth daily.   No facility-administered encounter medications on file as of 04/06/2023.    Allergies  (verified) Bee venom, Cherry flavor, Citalopram hydrobromide, Gabapentin, Statins, and Tramadol   History: Past Medical History:  Diagnosis Date   Anxiety    Asthma    AVM (arteriovenous malformation)    Intracranial with seizures   Closed fracture of left distal radius    Fibromyalgia    GERD (gastroesophageal reflux disease)    Hyperlipidemia    Hypothyroidism    Plantar fasciitis of right foot    Seizures    AVM repair   Past Surgical History:  Procedure Laterality Date   CHOLECYSTECTOMY     CRANIOTOMY     for AVM   FOOT SURGERY  2016   Due to plantar fasciitis   ORIF WRIST FRACTURE Left 02/16/2017   Procedure: OPEN REDUCTION INTERNAL FIXATION (ORIF) LEFT WRIST FRACTURE;  Surgeon: Sheral Apley, MD;  Location: [facility];  Service: Orthopedics;  Laterality: Left;   Family History  Problem Relation Age of Onset   Emphysema Father        died at age 23   COPD Father    Hypertension Mother    Social History   Socioeconomic History   Marital status: Single    Spouse name: Not on file   Number of children: 2   Years of education: Not on file   Highest education level: GED or equivalent  Occupational History    Comment: disability  Tobacco Use   Smoking status: Former    Years: 20    Types: Cigarettes    Quit date: 04/30/1999    Years since quitting: 23.9   Smokeless tobacco: Never  Vaping Use   Vaping Use: Never used  Substance and Sexual Activity   Alcohol use: No    Alcohol/week: 0.0 standard drinks of alcohol   Drug use: No   Sexual activity: Not Currently  Other Topics Concern   Not on file  Social History Narrative   Lives alone. Her mother lives 15 minutes away. Both children out of town, but daughter visits often.   Social Determinants of Health   Financial Resource Strain: Low Risk  (04/06/2023)   Overall Financial Resource Strain (CARDIA)    Difficulty of Paying Living Expenses: Not hard at all  Food Insecurity: No Food  Insecurity (04/06/2023)   Hunger Vital Sign    Worried About Running Out of Food in the Last Year: Never true    Ran Out of Food in the Last Year: Never true  Transportation Needs: No Transportation Needs (04/06/2023)   PRAPARE - Administrator, Civil Service (Medical): No    Lack of Transportation (Non-Medical): No  Physical Activity: Sufficiently Active (04/06/2023)   Exercise Vital Sign    Days of Exercise per Week: 5 days    Minutes of Exercise per Session: 30 min  Stress: No Stress Concern Present (04/06/2023)  [employer] of Occupational Health - Occupational Stress Questionnaire    Feeling of Stress : Not at all  Social Connections: Moderately Integrated (04/06/2023)   Social Connection and Isolation Panel [NHANES]    Frequency of Communication with Friends and Family: More than three times a week    Frequency of Social Gatherings with Friends and Family: More than three times a week    Attends Religious Services: More than 4 times per year    Active Member of [employer] or Organizations: Yes    Attends Banker Meetings: More than 4 times per year    Marital Status: Widowed    Tobacco Counseling Counseling given: Not Answered   Clinical Intake:  Pre-visit preparation completed: Yes  Pain : No/denies pain     Nutritional Risks: None Diabetes: No  How often do you need to have someone help you when you read instructions, pamphlets, or other written materials from your doctor or pharmacy?: 1 - Never  Diabetic?no   Interpreter Needed?: No  Information entered by :: Renie Ora, LPN   Activities of Daily Living    04/06/2023    9:53 AM 04/05/2023    5:14 PM  In your present state of health, do you have any difficulty performing the following activities:  Hearing? 0 0  Vision? 0 0  Difficulty concentrating or making decisions? 0 1  Walking or climbing stairs? 0 1  Dressing or bathing? 0 0  Doing errands, shopping? 0 0  Preparing Food  and eating ? N N  Using the Toilet? N N  In the past six months, have you accidently leaked urine? N N  Do you have problems with loss of bowel control? N N  Managing your Medications? N N  Managing your Finances? N N  Housekeeping or managing your Housekeeping? N N    Patient Care Team: Raliegh Ip, DO as PCP - General (Family Medicine)  Indicate any recent Medical Services you may have received from other than [facility] providers in the past year (date may be approximate).     Assessment:   This is a routine wellness examination for [employer].  Hearing/Vision screen Vision Screening - Comments:: Wears rx glasses - up to date with routine eye exams with  Dr.Lee  Dietary issues and exercise activities discussed: Current Exercise Habits: Home exercise routine, Type of exercise: walking, Time (Minutes): 30, Frequency (Times/Week): 3, Weekly Exercise (Minutes/Week): 90, Intensity: Mild, Exercise limited by: neurologic condition(s)   Goals Addressed             This Visit's Progress    DIET - EAT MORE FRUITS AND VEGETABLES   On track    Exercise 150 min/wk Moderate Activity   On track      Depression Screen    04/06/2023    9:52 AM 09/15/2022    3:08 PM 04/02/2022    9:54 AM 03/16/2022   11:10 AM 12/31/2021   11:55 AM 09/16/2021    2:52 PM 09/16/2021    2:51 PM  PHQ 2/9 Scores  PHQ - 2 Score 0 0 0 0 0 0 0  PHQ- 9 Score     3 5     Fall Risk    04/06/2023    9:51 AM 04/05/2023    5:14 PM 09/15/2022    3:08 PM 04/02/2022    9:49 AM 03/16/2022   11:10 AM  Fall Risk   Falls in the past year? 0 0 0 0 0  Number  falls in past yr: 0 0  0   Injury with Fall? 0 0  0   Risk for fall due to : No Fall Risks   No Fall Risks   Follow up Falls prevention discussed   Falls prevention discussed     FALL RISK PREVENTION PERTAINING TO THE HOME:  Any stairs in or around the home? No  If so, are there any without handrails? No  Home free of loose throw rugs in walkways, pet beds,  electrical cords, etc? Yes  Adequate lighting in your home to reduce risk of falls? Yes   ASSISTIVE DEVICES UTILIZED TO PREVENT FALLS:  Life alert? No  Use of a cane, walker or w/c? Yes  Grab bars in the bathroom? Yes  Shower chair or bench in shower? Yes  Elevated toilet seat or a handicapped toilet? No       04/29/2017    4:13 PM  MMSE - Mini Mental State Exam  Orientation to time 5  Orientation to Place 5  Registration 3  Attention/ Calculation 5  Recall 3  Language- name 2 objects 2  Language- repeat 1  Language- follow 3 step command 1  Language- read & follow direction 1  Write a sentence 1  Copy design 0  Total score 27        04/06/2023    9:54 AM 04/02/2022    9:59 AM 03/26/2020    3:00 PM  6CIT Screen  What Year? 0 points 0 points 0 points  What month? 0 points 0 points 0 points  What time? 0 points 0 points 0 points  Count back from 20 0 points 0 points 0 points  Months in reverse 0 points 0 points 0 points  Repeat phrase 0 points 2 points 0 points  Total Score 0 points 2 points 0 points    Immunizations Immunization History  Administered Date(s) Administered   Influenza Inj Mdck Quad Pf 09/02/2021   Influenza Split 08/28/2020, 09/02/2021   Influenza,inj,Quad PF,6+ Mos 09/16/2020   Influenza,inj,quad, With Preservative 09/01/2018, 07/27/2019   Influenza-Unspecified 10/22/2008, 08/26/2016, 09/22/2017, 08/24/2018, 09/02/2021   Moderna Sars-Covid-2 Vaccination 04/08/2020   Zoster Recombinat (Shingrix) 09/15/2022    TDAP status: Due, Education has been provided regarding the importance of this vaccine. Advised may receive this vaccine at local pharmacy or Health Dept. Aware to provide a copy of the vaccination record if obtained from local pharmacy or Health Dept. Verbalized acceptance and understanding.  Flu Vaccine status: Up to date  Pneumococcal vaccine status: Up to date  Covid-19 vaccine status: Completed vaccines  Qualifies for Shingles  Vaccine? Yes   Zostavax completed Yes   Shingrix Completed?: Yes  Screening Tests Health Maintenance  Topic Date Due   DTaP/Tdap/Td (1 - Tdap) Never done   COVID-19 Vaccine (2 - Moderna risk series) 05/06/2020   Zoster Vaccines- Shingrix (2 of 2) 11/10/2022   MAMMOGRAM  01/02/2023   Pneumonia Vaccine 51+ Years old (1 of 1 - PCV) 09/15/2023 (Originally 09/13/2021)   Fecal DNA (Cologuard)  09/15/2023 (Originally 09/13/2001)   DEXA SCAN  10/22/2023 (Originally 10/09/2022)   INFLUENZA VACCINE  07/15/2023   Medicare Annual Wellness (AWV)  04/05/2024   Hepatitis C Screening  Completed   HPV VACCINES  Aged Out    Health Maintenance  Health Maintenance Due  Topic Date Due   DTaP/Tdap/Td (1 - Tdap) Never done   COVID-19 Vaccine (2 - Moderna risk series) 05/06/2020   Zoster Vaccines- Shingrix (2 of 2) 11/10/2022  MAMMOGRAM  01/02/2023    Colorectal cancer screening: Type of screening: Cologuard. Completed patient has cologuard  kit to complete . Repeat every 3 years  Mammogram status: Ordered 04/06/2023. Pt provided with contact info and advised to call to schedule appt.   Bone Density status: Ordered scheduled 05/2023. Pt provided with contact info and advised to call to schedule appt.  Lung Cancer Screening: (Low Dose CT Chest recommended if Age 86-80 years, 30 pack-year currently smoking OR have quit w/in 15years.) does not qualify.   Lung Cancer Screening Referral: n/a  Additional Screening:  Hepatitis C Screening: does not qualify; Completed 10/05/2016  Vision Screening: Recommended annual ophthalmology exams for early detection of glaucoma and other disorders of the eye. Is the patient up to date with their annual eye exam?  Yes  Who is the provider or what is the name of the office in which the patient attends annual eye exams? Dr.Lee  If pt is not established with a provider, would they like to be referred to a provider to establish care? No .   Dental Screening:  Recommended annual dental exams for proper oral hygiene  Community Resource Referral / Chronic Care Management: CRR required this visit?  No   CCM required this visit?  No      Plan:     I have personally reviewed and noted the following in the patient's chart:   Medical and social history Use of alcohol, tobacco or illicit drugs  Current medications and supplements including opioid prescriptions. Patient is not currently taking opioid prescriptions. Functional ability and status Nutritional status Physical activity Advanced directives List of other physicians Hospitalizations, surgeries, and ER visits in previous 12 months Vitals Screenings to include cognitive, depression, and falls Referrals and appointments  In addition, I have reviewed and discussed with patient certain preventive protocols, quality metrics, and best practice recommendations. A written personalized care plan for preventive services as well as general preventive health recommendations were provided to patient.     Lorrene Reid, LPN   08/27/7828   Nurse Notes: Due Tdap Vaccine

## 2023-04-13 LAB — COLOGUARD: COLOGUARD: NEGATIVE

## 2023-04-23 ENCOUNTER — Other Ambulatory Visit: Payer: Self-pay | Admitting: *Deleted

## 2023-04-23 DIAGNOSIS — F411 Generalized anxiety disorder: Secondary | ICD-10-CM

## 2023-04-23 DIAGNOSIS — F410 Panic disorder [episodic paroxysmal anxiety] without agoraphobia: Secondary | ICD-10-CM

## 2023-04-23 DIAGNOSIS — Z79899 Other long term (current) drug therapy: Secondary | ICD-10-CM

## 2023-04-26 ENCOUNTER — Telehealth: Payer: Self-pay | Admitting: Family Medicine

## 2023-04-26 ENCOUNTER — Other Ambulatory Visit: Payer: Self-pay | Admitting: Family Medicine

## 2023-04-26 ENCOUNTER — Ambulatory Visit
Admission: RE | Admit: 2023-04-26 | Discharge: 2023-04-26 | Disposition: A | Discharge status: Home / Self Care | Payer: PPO | Source: Ambulatory Visit | Attending: Family Medicine | Admitting: Family Medicine

## 2023-04-26 DIAGNOSIS — Z1231 Encounter for screening mammogram for malignant neoplasm of breast: Secondary | ICD-10-CM

## 2023-04-26 DIAGNOSIS — Z0000 Encounter for general adult medical examination without abnormal findings: Secondary | ICD-10-CM

## 2023-04-26 NOTE — Telephone Encounter (Signed)
Pt aware samples are at front for her. Pt will come by to pick up today.

## 2023-04-26 NOTE — Telephone Encounter (Signed)
Calling for samples of Nurtec. Please call back to let her know if we have any

## 2023-06-07 ENCOUNTER — Other Ambulatory Visit: Payer: Self-pay | Admitting: Family Medicine

## 2023-06-07 DIAGNOSIS — J4521 Mild intermittent asthma with (acute) exacerbation: Secondary | ICD-10-CM

## 2023-06-07 DIAGNOSIS — Z780 Asymptomatic menopausal state: Secondary | ICD-10-CM

## 2023-06-09 ENCOUNTER — Ambulatory Visit (INDEPENDENT_AMBULATORY_CARE_PROVIDER_SITE_OTHER): Payer: PPO

## 2023-06-09 ENCOUNTER — Ambulatory Visit (INDEPENDENT_AMBULATORY_CARE_PROVIDER_SITE_OTHER): Payer: PPO | Admitting: Family Medicine

## 2023-06-09 ENCOUNTER — Encounter: Payer: Self-pay | Admitting: Family Medicine

## 2023-06-09 VITALS — BP 138/83 | HR 68 | Temp 98.3°F | Resp 20 | Ht 60.0 in | Wt 202.0 lb

## 2023-06-09 DIAGNOSIS — E034 Atrophy of thyroid (acquired): Secondary | ICD-10-CM | POA: Diagnosis not present

## 2023-06-09 DIAGNOSIS — J4520 Mild intermittent asthma, uncomplicated: Secondary | ICD-10-CM | POA: Diagnosis not present

## 2023-06-09 DIAGNOSIS — Z79899 Other long term (current) drug therapy: Secondary | ICD-10-CM | POA: Diagnosis not present

## 2023-06-09 DIAGNOSIS — F410 Panic disorder [episodic paroxysmal anxiety] without agoraphobia: Secondary | ICD-10-CM

## 2023-06-09 DIAGNOSIS — I10 Essential (primary) hypertension: Secondary | ICD-10-CM | POA: Diagnosis not present

## 2023-06-09 DIAGNOSIS — M797 Fibromyalgia: Secondary | ICD-10-CM | POA: Diagnosis not present

## 2023-06-09 DIAGNOSIS — Z780 Asymptomatic menopausal state: Secondary | ICD-10-CM

## 2023-06-09 DIAGNOSIS — F411 Generalized anxiety disorder: Secondary | ICD-10-CM | POA: Diagnosis not present

## 2023-06-09 MED ORDER — BUDESONIDE-FORMOTEROL FUMARATE 80-4.5 MCG/ACT IN AERO
2.0000 | INHALATION_SPRAY | Freq: Two times a day (BID) | RESPIRATORY_TRACT | 6 refills | Status: DC
Start: 2023-06-09 — End: 2024-05-11

## 2023-06-09 MED ORDER — LEVOTHYROXINE SODIUM 137 MCG PO TABS
137.0000 ug | ORAL_TABLET | Freq: Every day | ORAL | 3 refills | Status: DC
Start: 2023-06-09 — End: 2023-06-10

## 2023-06-09 MED ORDER — DULOXETINE HCL 30 MG PO CPEP
30.0000 mg | ORAL_CAPSULE | Freq: Every day | ORAL | 3 refills | Status: DC
Start: 2023-06-09 — End: 2024-02-21

## 2023-06-09 MED ORDER — ALPRAZOLAM 0.5 MG PO TABS
0.2500 mg | ORAL_TABLET | Freq: Two times a day (BID) | ORAL | 1 refills | Status: DC | PRN
Start: 2023-06-09 — End: 2023-06-10

## 2023-06-09 NOTE — Progress Notes (Signed)
Subjective: CC: 46-month follow-up PCP: Raliegh Ip, DO SEG:BTDV Alicia Russo is a 67 y.o. female presenting to clinic today for:  1.  Fibromyalgia Patient reports that she is feeling a little overly sedated by the 60 mg of Cymbalta.  Dose has been increased last visit.  She felt like she was doing better on the 30 mg and she would like to reduce dose again.  She otherwise has no complaints about the fibromyalgia  2.  Generalized anxiety disorder with panic attack Continues to use the Xanax sparingly but does need a refill.  No reports of excessive daytime sedation with medication, visual or auditory hallucinations.  No memory changes.  3.  Hypothyroidism Compliant with Synthroid.  No reports of tremor, changes in bowel habits.  4.  Hypertension Compliant with Cozaar.  No reports chest pain, shortness of breath outside of allergy season.  She does need refills on her Ventolin and Symbicort.  She is not utilizing Advair.  Continues to struggle with weight  ROS: Per HPI  Allergies  Allergen Reactions   Bee Venom     Cant breath, swell up   Cherry Flavor Hives      dizziness   Citalopram Hydrobromide     Mood swings, gets "really really mean"   Gabapentin Other (See Comments)    Hallucinations   Statins     Myalgia and muscle pain   Tramadol     unknown   Past Medical History:  Diagnosis Date   Anxiety    Asthma    AVM (arteriovenous malformation)    Intracranial with seizures   Closed fracture of left distal radius    Fibromyalgia    GERD (gastroesophageal reflux disease)    Hyperlipidemia    Hypothyroidism    Plantar fasciitis of right foot    Seizures (HCC)    AVM repair    Current Outpatient Medications:    albuterol (PROVENTIL) (2.5 MG/3ML) 0.083% nebulizer solution, USE ONE vial in nebulizer EVERY 6 HOURS AS NEEDED wheezing SHORTNESS OF BREATH, Disp: 240 mL, Rfl: 5   albuterol (VENTOLIN HFA) 108 (90 Base) MCG/ACT inhaler, INHALE 2 PUFFS INTO THE LUNGS  EVERY 6 HOURS AS NEEDED FOR WHEEZE OR SHORTNESS OF BREATH, Disp: 8.5 g, Rfl: 2   ALPRAZolam (XANAX) 0.5 MG tablet, Take 0.5-1 tablets (0.25-0.5 mg total) by mouth 2 (two) times daily as needed for anxiety (put on file)., Disp: 40 tablet, Rfl: 1   Biotin 10 MG TABS, Take 1 tablet by mouth 2 (two) times daily. , Disp: , Rfl:    cetirizine (ZYRTEC) 10 MG tablet, Take 1 tablet (10 mg total) by mouth daily., Disp: 90 tablet, Rfl: 3   Cholecalciferol (VITAMIN D3) 2000 UNITS TABS, Take 1 tablet by mouth 2 (two) times daily., Disp: , Rfl:    Cyanocobalamin (VITAMIN B-12 PO), Take 1 tablet by mouth 2 (two) times daily., Disp: , Rfl:    denosumab (PROLIA) 60 MG/ML SOSY injection, Inject 60 mg into the skin every 6 (six) months., Disp: 1 mL, Rfl: 0   DULoxetine (CYMBALTA) 60 MG capsule, Take 1 capsule (60 mg total) by mouth daily., Disp: 90 capsule, Rfl: 3   ezetimibe (ZETIA) 10 MG tablet, TAKE ONE TABLET BY MOUTH DAILY, Disp: 90 tablet, Rfl: 0   fluticasone (FLONASE) 50 MCG/ACT nasal spray, Place 2 sprays into both nostrils daily., Disp: 48 g, Rfl: 0   ibuprofen (ADVIL) 800 MG tablet, Take 1 tablet (800 mg total) by mouth every 8 (eight)  hours as needed for headache or moderate pain., Disp: 90 tablet, Rfl: 0   levothyroxine (SYNTHROID) 137 MCG tablet, TAKE ONE TABLET BY MOUTH ONCE DAILY, Disp: 90 tablet, Rfl: 3   losartan (COZAAR) 50 MG tablet, TAKE ONE TABLET BY MOUTH DAILY, Disp: 90 tablet, Rfl: 0   magnesium 30 MG tablet, Take 30 mg by mouth 2 (two) times daily., Disp: , Rfl:    methocarbamol (ROBAXIN) 500 MG tablet, TAKE 1 TABLET BY MOUTH EVERY 6 HOURS AS NEEDED FOR MUSCLE SPASMS (USE SPARINGLY), Disp: 40 tablet, Rfl: 3   montelukast (SINGULAIR) 10 MG tablet, TAKE ONE TABLET BY MOUTH EVERY EVENING, Disp: 90 tablet, Rfl: 0   omeprazole (PRILOSEC) 20 MG capsule, Take 1 capsule (20 mg total) by mouth daily., Disp: 90 capsule, Rfl: 3   Rimegepant Sulfate (NURTEC) 75 MG TBDP, Take 1 tablet by mouth every other  day. For migraine prevention, Disp: 16 tablet, Rfl: prn   tamsulosin (FLOMAX) 0.4 MG CAPS capsule, Take 1 capsule (0.4 mg total) by mouth daily., Disp: 30 capsule, Rfl: 3   aspirin 81 MG EC tablet, Take 81 mg by mouth 2 (two) times daily.  (Patient not taking: Reported on 06/09/2023), Disp: , Rfl:    fluticasone-salmeterol (ADVAIR) 100-50 MCG/ACT AEPB, Inhale 1 puff into the lungs 2 (two) times daily. (Patient not taking: Reported on 06/09/2023), Disp: 1 each, Rfl: 3 Social History   Socioeconomic History   Marital status: Single    Spouse name: Not on file   Number of children: 2   Years of education: Not on file   Highest education level: GED or equivalent  Occupational History    Comment: disability  Tobacco Use   Smoking status: Former    Years: 20    Types: Cigarettes    Quit date: 04/30/1999    Years since quitting: 24.1   Smokeless tobacco: Never  Vaping Use   Vaping Use: Never used  Substance and Sexual Activity   Alcohol use: No    Alcohol/week: 0.0 standard drinks of alcohol   Drug use: No   Sexual activity: Not Currently  Other Topics Concern   Not on file  Social History Narrative   Lives alone. Her mother lives 15 minutes away. Both children out of town, but daughter visits often.   Social Determinants of Health   Financial Resource Strain: Low Risk  (06/05/2023)   Overall Financial Resource Strain (CARDIA)    Difficulty of Paying Living Expenses: Not very hard  Food Insecurity: No Food Insecurity (06/05/2023)   Hunger Vital Sign    Worried About Running Out of Food in the Last Year: Never true    Ran Out of Food in the Last Year: Never true  Transportation Needs: No Transportation Needs (06/05/2023)   PRAPARE - Administrator, Civil Service (Medical): No    Lack of Transportation (Non-Medical): No  Physical Activity: Insufficiently Active (06/05/2023)   Exercise Vital Sign    Days of Exercise per Week: 4 days    Minutes of Exercise per Session: 20  min  Stress: No Stress Concern Present (06/05/2023)   [employer] of Occupational Health - Occupational Stress Questionnaire    Feeling of Stress : Not at all  Social Connections: Moderately Integrated (06/05/2023)   Social Connection and Isolation Panel [NHANES]    Frequency of Communication with Friends and Family: More than three times a week    Frequency of Social Gatherings with Friends and Family: More than three times  a week    Attends Religious Services: More than 4 times per year    Active Member of Clubs or Organizations: Yes    Attends Banker Meetings: More than 4 times per year    Marital Status: Divorced  Intimate Partner Violence: Not At Risk (04/06/2023)   Humiliation, Afraid, Rape, and Kick questionnaire    Fear of Current or Ex-Partner: No    Emotionally Abused: No    Physically Abused: No    Sexually Abused: No   Family History  Problem Relation Age of Onset   Emphysema Father        died at age 68   COPD Father    Hypertension Mother     Objective: Office vital signs reviewed. BP 138/83   Pulse 68   Temp 98.3 F (36.8 C) (Oral)   Resp 20   Ht 5' (1.524 m)   Wt 202 lb (91.6 kg)   SpO2 94%   BMI 39.45 kg/m   Physical Examination:  General: Awake, alert, morbidly obese, No acute distress HEENT: Sclera white.  Moist mucous membranes.  No exophthalmos.  No goiter Cardio: regular rate and rhythm, S1S2 heard, no murmurs appreciated Pulm: clear to auscultation bilaterally, no wheezes, rhonchi or rales; normal work of breathing on room air MSk: Ambulating independently with normal gait and station Psych: Mood stable, speech normal, affect appropriate.  Pleasant, interactive     06/09/2023    3:45 PM 04/06/2023    9:52 AM 09/15/2022    3:08 PM  Depression screen PHQ 2/9  Decreased Interest 0 0 0  Down, Depressed, Hopeless 0 0 0  PHQ - 2 Score 0 0 0  Altered sleeping 2    Tired, decreased energy 2    Change in appetite 0    Feeling bad  or failure about yourself  0    Trouble concentrating 1    Moving slowly or fidgety/restless 0    Suicidal thoughts 0    PHQ-9 Score 5    Difficult doing work/chores Not difficult at all        06/09/2023    3:45 PM 09/15/2022    3:08 PM 03/16/2022   11:10 AM 12/31/2021   11:55 AM  GAD 7 : Generalized Anxiety Score  Nervous, Anxious, on Edge 0 0 0 1  Control/stop worrying 0 0 0 0  Worry too much - different things 0 0 0 0  Trouble relaxing 0 0 0 1  Restless 0 0 0 0  Easily annoyed or irritable 0 0 0 0  Afraid - awful might happen 0 0 0 0  Total GAD 7 Score 0 0 0 2  Anxiety Difficulty  Not difficult at all Not difficult at all Not difficult at all    Assessment/ Plan: 67 y.o. female   Essential hypertension - Plan: Basic Metabolic Panel  Fibromyalgia - Plan: DULoxetine (CYMBALTA) 30 MG capsule  Hypothyroidism due to acquired atrophy of thyroid - Plan: TSH, T4, Free, levothyroxine (SYNTHROID) 137 MCG tablet  Generalized anxiety disorder with panic attacks - Plan: ToxASSURE Select 13 (MW), Urine, ALPRAZolam (XANAX) 0.5 MG tablet  Controlled substance agreement signed - Plan: ToxASSURE Select 13 (MW), Urine, ALPRAZolam (XANAX) 0.5 MG tablet  Mild intermittent asthma without complication - Plan: budesonide-formoterol (SYMBICORT) 80-4.5 MCG/ACT inhaler  Morbid obesity (HCC)  Blood pressure controlled.  No changes.  Not due for refills  Fibromyalgia chronic and stable but she has had some adverse side effects of increased dosing  so we will reduce back to 30 mg.  Rx sent  Asymptomatic from a thyroid standpoint.  Thyroid levels collected.  Continue Synthroid.  This has been renewed  Anxiety disorder is chronic and stable.  Continues to use alprazolam only as needed.  UDS and CSC were updated as per office policy today.  National narcotic database reviewed and there were no red flags.  Intermittent asthma chronic and stable.  I have renewed the Symbicort but I am not quite sure if  her insurance actually covers this.  Looks like we may be changed to Advair at some point.  She will contact me if for what ever reason we need to switch it back.  Albuterol also sent earlier  We also discussed weight loss today.  She is going to consider compounded GLP versus GIP.  She will let me know if she wants me to send drug to [facility] drug   Orders Placed This Encounter  Procedures   ToxASSURE Select 13 (MW), Urine    Current Outpatient Medications:    albuterol (PROVENTIL) (2.5 MG/3ML) 0.083% nebulizer solution, USE ONE vial in nebulizer EVERY 6 HOURS AS NEEDED wheezing SHORTNESS OF BREATH, Disp: 240 mL, Rfl: 5   albuterol (VENTOLIN HFA) 108 (90 Base) MCG/ACT inhaler, INHALE 2 PUFFS INTO THE LUNGS EVERY 6 HOURS AS NEEDED FOR WHEEZE OR SHORTNESS OF BREATH, Disp: 8.5 g, Rfl: 2   ALPRAZolam (XANAX) 0.5 MG tablet, Take 0.5-1 tablets (0.25-0.5 mg total) by mouth 2 (two) times daily as needed for anxiety (put on file)., Disp: 40 tablet, Rfl: 1   aspirin 81 MG EC tablet, Take 81 mg by mouth 2 (two) times daily. , Disp: , Rfl:    Biotin 10 MG TABS, Take 1 tablet by mouth 2 (two) times daily. , Disp: , Rfl:    cetirizine (ZYRTEC) 10 MG tablet, Take 1 tablet (10 mg total) by mouth daily., Disp: 90 tablet, Rfl: 3   Cholecalciferol (VITAMIN D3) 2000 UNITS TABS, Take 1 tablet by mouth 2 (two) times daily., Disp: , Rfl:    Cyanocobalamin (VITAMIN B-12 PO), Take 1 tablet by mouth 2 (two) times daily., Disp: , Rfl:    denosumab (PROLIA) 60 MG/ML SOSY injection, Inject 60 mg into the skin every 6 (six) months., Disp: 1 mL, Rfl: 0   DULoxetine (CYMBALTA) 60 MG capsule, Take 1 capsule (60 mg total) by mouth daily., Disp: 90 capsule, Rfl: 3   ezetimibe (ZETIA) 10 MG tablet, TAKE ONE TABLET BY MOUTH DAILY, Disp: 90 tablet, Rfl: 0   fluticasone (FLONASE) 50 MCG/ACT nasal spray, Place 2 sprays into both nostrils daily., Disp: 48 g, Rfl: 0   fluticasone-salmeterol (ADVAIR) 100-50 MCG/ACT AEPB, Inhale  1 puff into the lungs 2 (two) times daily., Disp: 1 each, Rfl: 3   ibuprofen (ADVIL) 800 MG tablet, Take 1 tablet (800 mg total) by mouth every 8 (eight) hours as needed for headache or moderate pain., Disp: 90 tablet, Rfl: 0   levothyroxine (SYNTHROID) 137 MCG tablet, TAKE ONE TABLET BY MOUTH ONCE DAILY, Disp: 90 tablet, Rfl: 3   losartan (COZAAR) 50 MG tablet, TAKE ONE TABLET BY MOUTH DAILY, Disp: 90 tablet, Rfl: 0   magnesium 30 MG tablet, Take 30 mg by mouth 2 (two) times daily., Disp: , Rfl:    methocarbamol (ROBAXIN) 500 MG tablet, TAKE 1 TABLET BY MOUTH EVERY 6 HOURS AS NEEDED FOR MUSCLE SPASMS (USE SPARINGLY), Disp: 40 tablet, Rfl: 3   montelukast (SINGULAIR) 10 MG tablet, TAKE ONE  TABLET BY MOUTH EVERY EVENING, Disp: 90 tablet, Rfl: 0   omeprazole (PRILOSEC) 20 MG capsule, Take 1 capsule (20 mg total) by mouth daily., Disp: 90 capsule, Rfl: 3   Rimegepant Sulfate (NURTEC) 75 MG TBDP, Take 1 tablet by mouth every other day. For migraine prevention, Disp: 16 tablet, Rfl: prn   tamsulosin (FLOMAX) 0.4 MG CAPS capsule, Take 1 capsule (0.4 mg total) by mouth daily., Disp: 30 capsule, Rfl: 3   TSH   T4, Free   Basic Metabolic Panel   No orders of the defined types were placed in this encounter.    Raliegh Ip, DO [facility] [phone]

## 2023-06-10 ENCOUNTER — Encounter: Payer: Self-pay | Admitting: Family Medicine

## 2023-06-10 DIAGNOSIS — Z79899 Other long term (current) drug therapy: Secondary | ICD-10-CM

## 2023-06-10 DIAGNOSIS — Z780 Asymptomatic menopausal state: Secondary | ICD-10-CM | POA: Diagnosis not present

## 2023-06-10 DIAGNOSIS — M810 Age-related osteoporosis without current pathological fracture: Secondary | ICD-10-CM | POA: Diagnosis not present

## 2023-06-10 DIAGNOSIS — F410 Panic disorder [episodic paroxysmal anxiety] without agoraphobia: Secondary | ICD-10-CM

## 2023-06-10 LAB — BASIC METABOLIC PANEL
BUN/Creatinine Ratio: 19 (ref 12–28)
BUN: 13 mg/dL (ref 8–27)
CO2: 21 mmol/L (ref 20–29)
Calcium: 9.4 mg/dL (ref 8.7–10.3)
Chloride: 100 mmol/L (ref 96–106)
Creatinine, Ser: 0.69 mg/dL (ref 0.57–1.00)
Glucose: 90 mg/dL (ref 70–99)
Potassium: 3.9 mmol/L (ref 3.5–5.2)
Sodium: 141 mmol/L (ref 134–144)
eGFR: 96 mL/min/{1.73_m2} (ref >59)

## 2023-06-10 LAB — TSH: TSH: 0.116 u[IU]/mL — ABNORMAL LOW (ref 0.450–4.500)

## 2023-06-10 LAB — T4, FREE: Free T4: 1.74 ng/dL (ref 0.82–1.77)

## 2023-06-10 MED ORDER — LEVOTHYROXINE SODIUM 137 MCG PO TABS
137.0000 ug | ORAL_TABLET | Freq: Every day | ORAL | 3 refills | Status: DC
Start: 2023-06-10 — End: 2023-06-18

## 2023-06-10 MED ORDER — ALPRAZOLAM 0.5 MG PO TABS
0.2500 mg | ORAL_TABLET | Freq: Two times a day (BID) | ORAL | 0 refills | Status: DC | PRN
Start: 2023-06-10 — End: 2024-10-09

## 2023-06-10 NOTE — Addendum Note (Signed)
Addended by: Sonny Masters on: 06/10/2023 09:35 AM   Modules accepted: Orders

## 2023-06-10 NOTE — Progress Notes (Signed)
RX resent to pharmacy.

## 2023-06-10 NOTE — Progress Notes (Signed)
Thyroid refill failed. resent

## 2023-06-10 NOTE — Progress Notes (Signed)
Xanax RX failed to go through, covering for PCP today. RX resent to pharmacy.

## 2023-06-10 NOTE — Addendum Note (Signed)
Addended by: Julious Payer D on: 06/10/2023 09:04 AM   Modules accepted: Orders

## 2023-06-12 LAB — TOXASSURE SELECT 13 (MW), URINE

## 2023-06-15 ENCOUNTER — Encounter: Payer: Self-pay | Admitting: Family Medicine

## 2023-06-18 ENCOUNTER — Other Ambulatory Visit: Payer: Self-pay | Admitting: Family

## 2023-06-18 MED ORDER — LEVOTHYROXINE SODIUM 125 MCG PO TABS: 125.0000 ug | ORAL_TABLET | Freq: Every day | ORAL | 1 refills | Status: DC

## 2023-06-22 ENCOUNTER — Other Ambulatory Visit: Payer: Self-pay | Admitting: Family Medicine

## 2023-06-22 MED ORDER — WEGOVY 0.5 MG/0.5ML ~~LOC~~ SOAJ
SUBCUTANEOUS | 0 refills | Status: DC
Start: 2023-06-22 — End: 2024-07-10

## 2023-06-29 ENCOUNTER — Telehealth: Payer: Self-pay

## 2023-06-29 NOTE — Telephone Encounter (Signed)
Prolia VOB initiated via [email or web address]  Last Prolia inj: 12/29/22 Next Prolia inj DUE:  06/29/23

## 2023-07-02 ENCOUNTER — Other Ambulatory Visit (HOSPITAL_COMMUNITY): Payer: Self-pay

## 2023-07-02 NOTE — Telephone Encounter (Signed)
Pt ready for scheduling for PROLIA on or after : 07/02/23  Out-of-pocket cost due at time of visit: $302  Primary: [employer] Prolia co-insurance: 20% Admin fee co-insurance: 0%  Secondary: --- Prolia co-insurance:  Admin fee co-insurance:   Medical Benefit Details: Date Benefits were checked: 06/30/23 Deductible: NO/ Coinsurance: 20%/ Admin Fee: 0%  Prior Auth: N/A PA# Expiration Date:    Pharmacy benefit: Copay $200 If patient wants fill through the pharmacy benefit please send prescription to:  [facility] , and include estimated need by date in rx notes. Pharmacy will ship medication directly to the office.  Patient not eligible for Prolia Copay Card. Copay Card can make patient's cost as little as $25. Link to apply: [email or web address]://www.amgensupportplus.[email or web address]/copay  ** This summary of benefits is an estimation of the patient's out-of-pocket cost. Exact cost may very based on individual plan coverage.

## 2023-07-02 NOTE — Addendum Note (Signed)
Addended by: Tamera Punt on: 07/02/2023 11:21 AM   Modules accepted: Orders

## 2023-07-02 NOTE — Telephone Encounter (Signed)
Prolia ordered. Patient due after 07/19

## 2023-07-19 ENCOUNTER — Telehealth: Payer: Self-pay | Admitting: Family Medicine

## 2023-07-19 ENCOUNTER — Ambulatory Visit: Payer: PPO | Admitting: Nurse Practitioner

## 2023-07-19 ENCOUNTER — Encounter: Payer: Self-pay | Admitting: Nurse Practitioner

## 2023-07-19 ENCOUNTER — Ambulatory Visit: Payer: PPO

## 2023-07-19 VITALS — BP 139/78 | HR 62 | Temp 98.5°F | Ht 60.0 in | Wt 199.8 lb

## 2023-07-19 DIAGNOSIS — J029 Acute pharyngitis, unspecified: Secondary | ICD-10-CM | POA: Diagnosis not present

## 2023-07-19 DIAGNOSIS — R051 Acute cough: Secondary | ICD-10-CM | POA: Diagnosis not present

## 2023-07-19 LAB — CULTURE, GROUP A STREP

## 2023-07-19 LAB — VERITOR FLU A/B WAIVED
Influenza A: NEGATIVE
Influenza B: NEGATIVE

## 2023-07-19 LAB — RAPID STREP SCREEN (MED CTR MEBANE ONLY): Strep Gp A Ag, IA W/Reflex: NEGATIVE

## 2023-07-19 NOTE — Telephone Encounter (Signed)
Called patient and rescheduled appointment for Prolia injection to 08/09/23 at 3:00 pm.

## 2023-07-19 NOTE — Progress Notes (Signed)
Acute Office Visit  Subjective:     Patient ID: LENIS KOZAR, female    DOB: 1956/01/19, 67 y.o.   MRN: [id number]  Chief Complaint  Patient presents with   Fever    Woke up yesterday morning feeling.   Cough    Woke up yesterday morning feeling.   Sore Throat    Woke up yesterday morning feeling.    Fever  Associated symptoms include congestion, coughing and a sore throat. Pertinent negatives include no abdominal pain, chest pain, diarrhea, ear pain, nausea, rash or vomiting.  Cough Associated symptoms include a fever and a sore throat. Pertinent negatives include no chest pain, ear pain, myalgias or rash.  Sore Throat  Associated symptoms include congestion and coughing. Pertinent negatives include no abdominal pain, diarrhea, ear pain or vomiting.   Upper Respiratory Infection: Patient complains of symptoms of a URI. Symptoms include congestion, cough, and sore throat. Onset of symptoms was 2 days ago, unchanged since that time. She also c/o congestion, cough described as productive of clear sputum, low grade fever, and nasal congestion for the past 2 days .  She is not drinking much. Evaluation to date: none. Treatment to date: antihistamines, cough suppressants, and decongestants.   POC Flu , Strep negative Awaiting COVID results Review of Systems  Constitutional:  Positive for fever.  HENT:  Positive for congestion and sore throat. Negative for ear pain and sinus pain.   Eyes:  Negative for pain.  Respiratory:  Positive for cough.   Cardiovascular:  Negative for chest pain and leg swelling.  Gastrointestinal:  Negative for abdominal pain, blood in stool, diarrhea, melena, nausea and vomiting.  Musculoskeletal:  Positive for joint pain. Negative for myalgias.  Skin:  Negative for itching and rash.  Neurological:  Negative for dizziness and weakness.  Endo/Heme/Allergies:  Negative for polydipsia. Does not bruise/bleed easily.   Negative unless indicated in HPI     Objective:    BP 139/78   Pulse 62   Temp 98.5 F (36.9 C) (Temporal)   Ht 5' (1.524 m)   Wt 199 lb 12.8 oz (90.6 kg)   SpO2 97%   BMI 39.02 kg/m  BP Readings from Last 3 Encounters:  07/19/23 139/78  06/09/23 138/83  12/08/22 (!) 149/78   Wt Readings from Last 3 Encounters:  07/19/23 199 lb 12.8 oz (90.6 kg)  06/09/23 202 lb (91.6 kg)  04/06/23 194 lb (88 kg)      Physical Exam Vitals and nursing note reviewed.  Constitutional:      Appearance: She is well-developed.  HENT:     Head: Normocephalic and atraumatic.     Nose: Congestion present. No rhinorrhea.     Mouth/Throat:     Lips: Pink.     Mouth: Mucous membranes are moist.     Pharynx: No pharyngeal swelling, oropharyngeal exudate, posterior oropharyngeal erythema, uvula swelling or postnasal drip.     Tonsils: No tonsillar exudate or tonsillar abscesses.  Eyes:     General: No scleral icterus.    Extraocular Movements: Extraocular movements intact.     Conjunctiva/sclera: Conjunctivae normal.     Pupils: Pupils are equal, round, and reactive to light.  Cardiovascular:     Rate and Rhythm: Normal rate and regular rhythm.  Pulmonary:     Effort: Pulmonary effort is normal.     Breath sounds: Normal breath sounds.  Musculoskeletal:        General: Normal range of motion.  Right lower leg: No edema.     Left lower leg: No edema.  Skin:    General: Skin is warm and dry.     Coloration: Skin is not jaundiced.     Findings: No rash.  Neurological:     General: No focal deficit present.     Mental Status: She is alert and oriented to person, place, and time. Mental status is at baseline.  Psychiatric:        Mood and Affect: Mood normal.        Behavior: Behavior normal.        Thought Content: Thought content normal.        Judgment: Judgment normal.     No results found for any visits on 07/19/23.      Assessment & Plan:  Acute cough -     Novel Coronavirus, NAA ([facility]) -     Veritor Flu  A/B Waived -     Rapid Strep Screen ([facility] ONLY)  Sore throat -     Novel Coronavirus, NAA ([facility]) -     Veritor Flu A/B Waived -     Rapid Strep Screen ([facility] ONLY) -     Culture, Group A Strep   Larita Fife ws for cough URI symptoms, no acute distress Continue OTC Mucinex for cough Tylenol/ibuprofen for fever Flonase, zyrtec, Singular fos congestion - awaiting for COVID results   The above assessment and management plan was discussed with the patient. The patient verbalized understanding of and has agreed to the management plan. Patient is aware to call the clinic if they develop any new symptoms or if symptoms persist or worsen. Patient is aware when to return to the clinic for a follow-up visit. Patient educated on when it is appropriate to go to the emergency department.  Return if symptoms worsen or fail to improve.    Arrie Aran Santa Lighter, DNP [facility] [address] [place], [place] [address] [phone]

## 2023-07-29 NOTE — Telephone Encounter (Signed)
Appt 08/09/23

## 2023-08-09 ENCOUNTER — Ambulatory Visit: Payer: PPO

## 2023-08-09 DIAGNOSIS — M810 Age-related osteoporosis without current pathological fracture: Secondary | ICD-10-CM

## 2023-08-09 MED ORDER — DENOSUMAB 60 MG/ML ~~LOC~~ SOSY
60.0000 mg | PREFILLED_SYRINGE | Freq: Once | SUBCUTANEOUS | Status: DC
Start: 2023-08-09 — End: 2024-11-06

## 2023-08-09 NOTE — Progress Notes (Signed)
Prolia injection given to right upper arm.  Patient tolerated well. [employer] and [employer]

## 2023-08-23 ENCOUNTER — Ambulatory Visit (INDEPENDENT_AMBULATORY_CARE_PROVIDER_SITE_OTHER): Payer: PPO | Admitting: Family Medicine

## 2023-08-23 ENCOUNTER — Encounter: Payer: Self-pay | Admitting: Family Medicine

## 2023-08-23 VITALS — BP 141/89 | HR 68 | Temp 98.4°F | Ht 60.0 in | Wt 198.0 lb

## 2023-08-23 DIAGNOSIS — J329 Chronic sinusitis, unspecified: Secondary | ICD-10-CM

## 2023-08-23 DIAGNOSIS — E034 Atrophy of thyroid (acquired): Secondary | ICD-10-CM

## 2023-08-23 DIAGNOSIS — M797 Fibromyalgia: Secondary | ICD-10-CM

## 2023-08-23 MED ORDER — FLUCONAZOLE 150 MG PO TABS
150.0000 mg | ORAL_TABLET | Freq: Once | ORAL | 0 refills | Status: AC
Start: 2023-08-23 — End: 2023-08-23

## 2023-08-23 MED ORDER — IBUPROFEN 800 MG PO TABS
800.0000 mg | ORAL_TABLET | Freq: Three times a day (TID) | ORAL | 0 refills | Status: DC | PRN
Start: 2023-08-23 — End: 2024-01-10

## 2023-08-23 MED ORDER — AMOXICILLIN-POT CLAVULANATE 875-125 MG PO TABS
1.0000 | ORAL_TABLET | Freq: Two times a day (BID) | ORAL | 0 refills | Status: DC
Start: 2023-08-23 — End: 2023-10-21

## 2023-08-23 NOTE — Progress Notes (Signed)
Subjective: ZO:[id number] PCP: Alicia Ip, DO JYN:WGNF Alicia Russo is a 67 y.o. female presenting to clinic today for:  1. Hypothyroidism Reports compliance with her medications.  Reports no tremor, heart palpitations or changes in bowel habits.  She reports feeling better on the adjusted dose of the Synthroid.  2.  Morbid obesity She is up to 20 units of the semaglutide which is 0.5 mg weekly.  She was not sure if she had another refill so asked to get this refilled.  Reports occasional nausea but overall has really not had any issues with the medication.  She is feeling better and feels like her appetite is lessened.  Has Zofran on hand if needed  3.  Rhinosinusitis Reports rhinosinusitis that is been refractory to use of Flonase, Singulair and Zyrtec.  Her mucus has now changed to yellowish discharge.   ROS: Per HPI  Allergies  Allergen Reactions   Bee Venom     Cant breath, swell up   Cherry Flavor Hives      dizziness   Citalopram Hydrobromide     Mood swings, gets "really really mean"   Gabapentin Other (See Comments)    Hallucinations   Statins     Myalgia and muscle pain   Tramadol     unknown   Past Medical History:  Diagnosis Date   Anxiety    Asthma    AVM (arteriovenous malformation)    Intracranial with seizures   Closed fracture of left distal radius    Fibromyalgia    GERD (gastroesophageal reflux disease)    Hyperlipidemia    Hypothyroidism    Plantar fasciitis of right foot    Seizures (HCC)    AVM repair    Current Outpatient Medications:    albuterol (PROVENTIL) (2.5 MG/3ML) 0.083% nebulizer solution, USE ONE vial in nebulizer EVERY 6 HOURS AS NEEDED wheezing SHORTNESS OF BREATH, Disp: 240 mL, Rfl: 5   albuterol (VENTOLIN HFA) 108 (90 Base) MCG/ACT inhaler, INHALE 2 PUFFS INTO THE LUNGS EVERY 6 HOURS AS NEEDED FOR WHEEZE OR SHORTNESS OF BREATH, Disp: 8.5 g, Rfl: 2   ALPRAZolam (XANAX) 0.5 MG tablet, Take 0.5-1 tablets (0.25-0.5 mg  total) by mouth 2 (two) times daily as needed for anxiety (put on file)., Disp: 40 tablet, Rfl: 0   aspirin 81 MG EC tablet, Take 81 mg by mouth 2 (two) times daily.  (Patient not taking: Reported on 06/09/2023), Disp: , Rfl:    Biotin 10 MG TABS, Take 1 tablet by mouth 2 (two) times daily. , Disp: , Rfl:    budesonide-formoterol (SYMBICORT) 80-4.5 MCG/ACT inhaler, Inhale 2 puffs into the lungs in the morning and at bedtime., Disp: 10.2 g, Rfl: 6   cetirizine (ZYRTEC) 10 MG tablet, Take 1 tablet (10 mg total) by mouth daily., Disp: 90 tablet, Rfl: 3   Cholecalciferol (VITAMIN D3) 2000 UNITS TABS, Take 1 tablet by mouth 2 (two) times daily., Disp: , Rfl:    Cyanocobalamin (VITAMIN B-12 PO), Take 1 tablet by mouth 2 (two) times daily., Disp: , Rfl:    denosumab (PROLIA) 60 MG/ML SOSY injection, Inject 60 mg into the skin every 6 (six) months., Disp: 1 mL, Rfl: 0   DULoxetine (CYMBALTA) 30 MG capsule, Take 1 capsule (30 mg total) by mouth daily. Reduction in dose, Disp: 90 capsule, Rfl: 3   ezetimibe (ZETIA) 10 MG tablet, TAKE ONE TABLET BY MOUTH DAILY, Disp: 90 tablet, Rfl: 0   fluticasone (FLONASE) 50 MCG/ACT nasal spray, Place  2 sprays into both nostrils daily., Disp: 48 g, Rfl: 0   ibuprofen (ADVIL) 800 MG tablet, Take 1 tablet (800 mg total) by mouth every 8 (eight) hours as needed for headache or moderate pain., Disp: 90 tablet, Rfl: 0   levothyroxine (SYNTHROID) 125 MCG tablet, Take 1 tablet (125 mcg total) by mouth daily., Disp: 90 tablet, Rfl: 1   losartan (COZAAR) 50 MG tablet, TAKE ONE TABLET BY MOUTH DAILY, Disp: 90 tablet, Rfl: 0   magnesium 30 MG tablet, Take 30 mg by mouth 2 (two) times daily., Disp: , Rfl:    methocarbamol (ROBAXIN) 500 MG tablet, TAKE 1 TABLET BY MOUTH EVERY 6 HOURS AS NEEDED FOR MUSCLE SPASMS (USE SPARINGLY), Disp: 40 tablet, Rfl: 3   montelukast (SINGULAIR) 10 MG tablet, TAKE ONE TABLET BY MOUTH EVERY EVENING, Disp: 90 tablet, Rfl: 0   omeprazole (PRILOSEC) 20 MG  capsule, Take 1 capsule (20 mg total) by mouth daily., Disp: 90 capsule, Rfl: 3   Rimegepant Sulfate (NURTEC) 75 MG TBDP, Take 1 tablet by mouth every other day. For migraine prevention, Disp: 16 tablet, Rfl: prn   Semaglutide-Weight Management (WEGOVY) 0.5 MG/0.5ML SOAJ, Compounded per [facility] drug.  Advance as directed monthly. Inject subcutaneous weekly., Disp: 2 mL, Rfl: 0   tamsulosin (FLOMAX) 0.4 MG CAPS capsule, Take 1 capsule (0.4 mg total) by mouth daily., Disp: 30 capsule, Rfl: 3  Current Facility-Administered Medications:    denosumab (PROLIA) injection 60 mg, 60 mg, Subcutaneous, Once, Alicia Russo M, DO Social History   Socioeconomic History   Marital status: Single    Spouse name: Not on file   Number of children: 2   Years of education: Not on file   Highest education level: GED or equivalent  Occupational History    Comment: disability  Tobacco Use   Smoking status: Former    Current packs/day: 0.00    Types: Cigarettes    Start date: 04/30/1979    Quit date: 04/30/1999    Years since quitting: 24.3   Smokeless tobacco: Never  Vaping Use   Vaping status: Never Used  Substance and Sexual Activity   Alcohol use: No    Alcohol/week: 0.0 standard drinks of alcohol   Drug use: No   Sexual activity: Not Currently  Other Topics Concern   Not on file  Social History Narrative   Lives alone. Her mother lives 15 minutes away. Both children out of town, but daughter visits often.   Social Determinants of Health   Financial Resource Strain: Low Risk  (06/05/2023)   Overall Financial Resource Strain (CARDIA)    Difficulty of Paying Living Expenses: Not very hard  Food Insecurity: No Food Insecurity (06/05/2023)   Hunger Vital Sign    Worried About Running Out of Food in the Last Year: Never true    Ran Out of Food in the Last Year: Never true  Transportation Needs: No Transportation Needs (06/05/2023)   PRAPARE - Administrator, Civil Service (Medical): No     Lack of Transportation (Non-Medical): No  Physical Activity: Insufficiently Active (06/05/2023)   Exercise Vital Sign    Days of Exercise per Week: 4 days    Minutes of Exercise per Session: 20 min  Stress: No Stress Concern Present (06/05/2023)   [employer] of Occupational Health - Occupational Stress Questionnaire    Feeling of Stress : Not at all  Social Connections: Moderately Integrated (06/05/2023)   Social Connection and Isolation Panel [NHANES]  Frequency of Communication with Friends and Family: More than three times a week    Frequency of Social Gatherings with Friends and Family: More than three times a week    Attends Religious Services: More than 4 times per year    Active Member of [employer] or Organizations: Yes    Attends Engineer, structural: More than 4 times per year    Marital Status: Divorced  Intimate Partner Violence: Not At Risk (04/06/2023)   Humiliation, Afraid, Rape, and Kick questionnaire    Fear of Current or Ex-Partner: No    Emotionally Abused: No    Physically Abused: No    Sexually Abused: No   Family History  Problem Relation Age of Onset   Emphysema Father        died at age 22   COPD Father    Hypertension Mother     Objective: Office vital signs reviewed. BP (!) 141/89   Pulse 68   Temp 98.4 F (36.9 C)   Ht 5' (1.524 m)   Wt 198 lb (89.8 kg)   SpO2 97%   BMI 38.67 kg/m   Physical Examination:  General: Awake, alert, morbidly obese, No acute distress HEENT: Sclera white.  No exophthalmos.  Mucus is in fact yellow and purulent appearing Cardio: regular rate and rhythm, S1S2 heard, no murmurs appreciated Pulm: clear to auscultation bilaterally, no wheezes, rhonchi or rales; normal work of breathing on room air    Assessment/ Plan: 67 y.o. female   Hypothyroidism due to acquired atrophy of thyroid - Plan: TSH, T4, Free  Fibromyalgia - Plan: ibuprofen (ADVIL) 800 MG tablet  Rhinosinusitis - Plan:  amoxicillin-clavulanate (AUGMENTIN) 875-125 MG tablet, fluconazole (DIFLUCAN) 150 MG tablet  Morbid obesity (HCC)  Check thyroid levels.  Clinically feeling better  Advil renewed.  Fibromyalgia is chronic and stable  Augmentin sent.  I gave her a sample of Nasacort.  She will let me know if she likes this better than Flonase and I am glad to prescribe.  May continue Singulair and Zyrtec as prescribed.  I sent in a prescription for the 1.7 and 2.4 mg of somatically tied to placed on hold.  Next dose should be 1 mg weekly.  Follow-up in 4 months for weight check and repeat thyroid levels   Claron Rosencrans Hulen Skains, DO [facility] [phone]

## 2023-08-24 LAB — T4, FREE: Free T4: 1.66 ng/dL (ref 0.82–1.77)

## 2023-08-24 LAB — TSH: TSH: 0.499 u[IU]/mL (ref 0.450–4.500)

## 2023-10-11 ENCOUNTER — Telehealth (INDEPENDENT_AMBULATORY_CARE_PROVIDER_SITE_OTHER): Payer: PPO | Admitting: Family Medicine

## 2023-10-11 ENCOUNTER — Encounter: Payer: Self-pay | Admitting: Family Medicine

## 2023-10-11 DIAGNOSIS — J069 Acute upper respiratory infection, unspecified: Secondary | ICD-10-CM

## 2023-10-11 NOTE — Progress Notes (Signed)
   Virtual Visit via video Note   Due to COVID-19 pandemic this visit was conducted virtually. This visit type was conducted due to national recommendations for restrictions regarding the COVID-19 Pandemic (e.g. social distancing, sheltering in place) in an effort to limit this patient's exposure and mitigate transmission in our community. All issues noted in this document were discussed and addressed.  A physical exam was not performed with this format.  I connected with  Alicia Russo  on 10/11/23 at 1254 by video and verified that I am speaking with the correct person using two identifiers. Alicia Russo is currently located at home and no one is currently with her during the visit. The provider, Gabriel Earing, FNP is located in their office at time of visit.  I discussed the limitations, risks, security and privacy concerns of performing an evaluation and management service by video  and the availability of in person appointments. I also discussed with the patient that there may be a patient responsible charge related to this service. The patient expressed understanding and agreed to proceed.  CC: nasal congestion  History and Present Illness:  Upper Respiratory Infection: Patient complains of symptoms of a URI. Symptoms include right ear pain, congestion, and cough. Onset of symptoms was 1 day ago, unchanged since that time. She also c/o  wheezing intermittently  for the past 1 days .  She is drinking plenty of fluids. Evaluation to date: none. Treatment to date: antihistamines, cough suppressants, and decongestants. Hx of mild intermittent asthma. Using her nebulizer prn- once today with improvement.    ROS As per HPI.    Observations/Objective: Alert and oriented. Respirations unlabored. No cyanosis. Non toxic appearing. Normal mood and behavior.    Assessment and Plan: Tressy was seen today for nasal congestion.  Diagnoses and all orders for this visit:  Viral URI with  cough She will take a home Covid test and notify the office of the results. Continue nebulizer prn. If continues to have increase use, will follow up for asthma exacerbation. Discussed symptomatic care and return precautions.    Follow Up Instructions: As needed.     I discussed the assessment and treatment plan with the patient. The patient was provided an opportunity to ask questions and all were answered. The patient agreed with the plan and demonstrated an understanding of the instructions.   The patient was advised to call back or seek an in-person evaluation if the symptoms worsen or if the condition fails to improve as anticipated.  The above assessment and management plan was discussed with the patient. The patient verbalized understanding of and has agreed to the management plan. Patient is aware to call the clinic if symptoms persist or worsen. Patient is aware when to return to the clinic for a follow-up visit. Patient educated on when it is appropriate to go to the emergency department.   Time call ended:1302  I provided 6 minutes of face-to-face time during this encounter.    Gabriel Earing, FNP

## 2023-10-21 ENCOUNTER — Ambulatory Visit
Admission: RE | Admit: 2023-10-21 | Discharge: 2023-10-21 | Disposition: A | Discharge status: Home / Self Care | Payer: PPO | Source: Ambulatory Visit | Attending: Urology | Admitting: Urology

## 2023-10-21 ENCOUNTER — Ambulatory Visit: Payer: PPO | Admitting: Urology

## 2023-10-21 ENCOUNTER — Encounter: Payer: Self-pay | Admitting: Urology

## 2023-10-21 VITALS — BP 135/83 | HR 70 | Ht 60.0 in | Wt 195.7 lb

## 2023-10-21 DIAGNOSIS — Z8052 Family history of malignant neoplasm of bladder: Secondary | ICD-10-CM | POA: Diagnosis not present

## 2023-10-21 DIAGNOSIS — N200 Calculus of kidney: Secondary | ICD-10-CM | POA: Diagnosis not present

## 2023-10-21 NOTE — Addendum Note (Signed)
Addended by: Frankey Shown on: 10/21/2023 09:39 AM   Modules accepted: Orders

## 2023-10-21 NOTE — Progress Notes (Signed)
   10/21/2023 9:36 AM   [name] [birth year] [id number]  Reason for visit: Follow up nephrolithiasis, family history of bladder and kidney cancer  HPI: 67 year old female with fibromyalgia and obesity who I originally met in November 2023 for nonobstructive right lower pole renal stones.  She has a history of 1 spontaneously passed stone in November 2022.  She has chronic right-sided flank and low back pain.  She had a CT scan in October 2023 that shows to 4 mm right lower pole nonobstructing stones with no hydronephrosis or ureteral stones.  She opted for surveillance.  She denies any problems since our last visit, specifically no gross hematuria or urinary symptoms.  I personally viewed and interpreted her KUB today that shows stable 4 mm right lower pole stones  We discussed general stone prevention strategies including adequate hydration with goal of producing 2.5 L of urine daily, increasing citric acid intake, increasing calcium intake during high oxalate meals, minimizing animal protein, and decreasing salt intake. Information about dietary recommendations given today.   -RTC 1 year KUB -Continue yearly UA with PCP to evaluate for microscopic hematuria -If doing well next year likely can follow-up as needed   Sondra Come, MD  [facility] [address] [place], [place] [address] [phone]

## 2023-10-26 ENCOUNTER — Other Ambulatory Visit: Payer: Self-pay | Admitting: Family Medicine

## 2023-10-26 MED ORDER — LEVOTHYROXINE SODIUM 137 MCG PO TABS: 137.0000 ug | ORAL_TABLET | Freq: Every day | ORAL | 0 refills | Status: DC

## 2023-10-26 NOTE — Telephone Encounter (Unsigned)
Copied from CRM [id number]. Topic: Clinical - Medication Refill >> Oct 26, 2023  3:04 PM Cassiday T wrote: Most Recent Primary Care Visit:  Provider: Gabriel Earing  Department: [facility]  Visit Type: MYCHART VIDEO VISIT  Date: 10/11/2023  Medication: levothyroxine (SYNTHROID) 125 mg   Has the patient contacted their pharmacy? Yes (Agent: If no, request that the patient contact the pharmacy for the refill. If patient does not wish to contact the pharmacy document the reason why and proceed with request.) (Agent: If yes, when and what did the pharmacy advise?)  Is this the correct pharmacy for this prescription? Yes If no, delete pharmacy and type the correct one.  This is the patient's preferred pharmacy:  [facility] - [place], [place] - [address] [place] [address] Phone: [phone] Fax: [phone]   Has the prescription been filled recently? No Is the patient out of the medication? Yes  Has the patient been seen for an appointment in the last year OR does the patient have an upcoming appointment?yes   Can we respond through MyChart? No  Agent: Please be advised that Rx refills may take up to 3 business days. We ask that you follow-up with your pharmacy.

## 2023-11-24 ENCOUNTER — Encounter: Payer: Self-pay | Admitting: Family Medicine

## 2023-11-26 ENCOUNTER — Other Ambulatory Visit: Payer: Self-pay | Admitting: Family Medicine

## 2023-11-26 MED ORDER — LEVOTHYROXINE SODIUM 125 MCG PO TABS: 125.0000 ug | ORAL_TABLET | Freq: Every day | ORAL | 3 refills | Status: DC

## 2024-01-10 ENCOUNTER — Ambulatory Visit (INDEPENDENT_AMBULATORY_CARE_PROVIDER_SITE_OTHER): Payer: PPO | Admitting: Family Medicine

## 2024-01-10 ENCOUNTER — Encounter: Payer: Self-pay | Admitting: Family Medicine

## 2024-01-10 VITALS — BP 143/87 | HR 67 | Temp 98.6°F | Ht 60.0 in | Wt 192.2 lb

## 2024-01-10 DIAGNOSIS — Z0001 Encounter for general adult medical examination with abnormal findings: Secondary | ICD-10-CM | POA: Diagnosis not present

## 2024-01-10 DIAGNOSIS — M797 Fibromyalgia: Secondary | ICD-10-CM

## 2024-01-10 DIAGNOSIS — N200 Calculus of kidney: Secondary | ICD-10-CM

## 2024-01-10 DIAGNOSIS — M810 Age-related osteoporosis without current pathological fracture: Secondary | ICD-10-CM

## 2024-01-10 DIAGNOSIS — I10 Essential (primary) hypertension: Secondary | ICD-10-CM

## 2024-01-10 DIAGNOSIS — E7800 Pure hypercholesterolemia, unspecified: Secondary | ICD-10-CM | POA: Diagnosis not present

## 2024-01-10 DIAGNOSIS — E034 Atrophy of thyroid (acquired): Secondary | ICD-10-CM

## 2024-01-10 DIAGNOSIS — Z0000 Encounter for general adult medical examination without abnormal findings: Secondary | ICD-10-CM

## 2024-01-10 DIAGNOSIS — Z23 Encounter for immunization: Secondary | ICD-10-CM

## 2024-01-10 LAB — BAYER DCA HB A1C WAIVED: HB A1C (BAYER DCA - WAIVED): 5 % (ref 4.8–5.6)

## 2024-01-10 LAB — LIPID PANEL

## 2024-01-10 MED ORDER — IBUPROFEN 800 MG PO TABS: 800.0000 mg | ORAL_TABLET | Freq: Three times a day (TID) | ORAL | 0 refills | Status: DC | PRN

## 2024-01-10 NOTE — Progress Notes (Addendum)
Alicia Russo is a 68 y.o. female presents to office today for annual physical exam examination.    Concerns today include: None.  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  Refills needed today: motrin  Immunization History  Administered Date(s) Administered   Fluad Trivalent(High Dose 65+) 01/10/2024   Influenza Inj Mdck Quad Pf 09/02/2021   Influenza Split 08/28/2020, 09/02/2021   Influenza,inj,Quad PF,6+ Mos 09/16/2020   Influenza,inj,quad, With Preservative 09/01/2018, 07/27/2019   Influenza-Unspecified 10/22/2008, 08/26/2016, 09/22/2017, 08/24/2018, 09/02/2021   Moderna Sars-Covid-2 Vaccination 04/08/2020   Zoster Recombinant(Shingrix) 09/15/2022   Past Medical History:  Diagnosis Date   Anxiety    Asthma    AVM (arteriovenous malformation)    Intracranial with seizures   Closed fracture of left distal radius    Fibromyalgia    GERD (gastroesophageal reflux disease)    Hyperlipidemia    Hypothyroidism    Plantar fasciitis of right foot    Seizures (HCC)    AVM repair   Social History   Socioeconomic History   Marital status: Single    Spouse name: Not on file   Number of children: 2   Years of education: Not on file   Highest education level: GED or equivalent  Occupational History    Comment: disability  Tobacco Use   Smoking status: Former    Current packs/day: 0.00    Types: Cigarettes    Start date: 04/30/1979    Quit date: 04/30/1999    Years since quitting: 24.7   Smokeless tobacco: Never  Vaping Use   Vaping status: Never Used  Substance and Sexual Activity   Alcohol use: No    Alcohol/week: 0.0 standard drinks of alcohol   Drug use: No   Sexual activity: Not Currently  Other Topics Concern   Not on file  Social History Narrative   Lives alone. Her mother lives 15  minutes away. Both children out of town, but daughter visits often.   Social Drivers of Corporate investment banker Strain: Low Risk  (01/10/2024)   Overall Financial Resource Strain (CARDIA)    Difficulty of Paying Living Expenses: Not very hard  Food Insecurity: No Food Insecurity (01/10/2024)   Hunger Vital Sign    Worried About Running Out of Food in the Last Year: Never true    Ran Out of Food in the Last Year: Never true  Transportation Needs: No Transportation Needs (01/10/2024)   PRAPARE - Administrator, Civil Service (Medical): No    Lack of Transportation (Non-Medical): No  Physical Activity: Insufficiently Active (01/10/2024)   Exercise Vital Sign    Days of Exercise per Week: 4 days    Minutes of Exercise per Session: 30 min  Stress: No Stress Concern Present (01/10/2024)   [employer] of Occupational Health - Occupational Stress Questionnaire    Feeling of Stress : Not at all  Social Connections: Moderately Integrated (01/10/2024)   Social Connection and Isolation Panel [NHANES]    Frequency of Communication with Friends and Family: More than three times a week    Frequency of Social Gatherings with Friends and Family: More than three times a week    Attends Religious Services: More than 4 times per year    Active Member of [employer] or Organizations: Yes    Attends Banker Meetings:  More than 4 times per year    Marital Status: Divorced  Intimate Partner Violence: Not At Risk (01/10/2024)   Humiliation, Afraid, Rape, and Kick questionnaire    Fear of Current or Ex-Partner: No    Emotionally Abused: No    Physically Abused: No    Sexually Abused: No   Past Surgical History:  Procedure Laterality Date   CHOLECYSTECTOMY     CRANIOTOMY     for AVM   FOOT SURGERY  2016   Due to plantar fasciitis   ORIF WRIST FRACTURE Left 02/16/2017   Procedure: OPEN REDUCTION INTERNAL FIXATION (ORIF) LEFT WRIST FRACTURE;  Surgeon: Sheral Apley, MD;   Location: [facility];  Service: Orthopedics;  Laterality: Left;   Family History  Problem Relation Age of Onset   Emphysema Father        died at age 42   COPD Father    Hypertension Mother     Current Outpatient Medications:    albuterol (PROVENTIL) (2.5 MG/3ML) 0.083% nebulizer solution, USE ONE vial in nebulizer EVERY 6 HOURS AS NEEDED wheezing SHORTNESS OF BREATH, Disp: 240 mL, Rfl: 5   albuterol (VENTOLIN HFA) 108 (90 Base) MCG/ACT inhaler, INHALE 2 PUFFS INTO THE LUNGS EVERY 6 HOURS AS NEEDED FOR WHEEZE OR SHORTNESS OF BREATH, Disp: 8.5 g, Rfl: 2   ALPRAZolam (XANAX) 0.5 MG tablet, Take 0.5-1 tablets (0.25-0.5 mg total) by mouth 2 (two) times daily as needed for anxiety (put on file)., Disp: 40 tablet, Rfl: 0   Biotin 10 MG TABS, Take 1 tablet by mouth 2 (two) times daily. , Disp: , Rfl:    budesonide-formoterol (SYMBICORT) 80-4.5 MCG/ACT inhaler, Inhale 2 puffs into the lungs in the morning and at bedtime., Disp: 10.2 g, Rfl: 6   cetirizine (ZYRTEC) 10 MG tablet, Take 1 tablet (10 mg total) by mouth daily., Disp: 90 tablet, Rfl: 3   Cholecalciferol (VITAMIN D3) 2000 UNITS TABS, Take 1 tablet by mouth 2 (two) times daily., Disp: , Rfl:    Cyanocobalamin (VITAMIN B-12 PO), Take 1 tablet by mouth 2 (two) times daily., Disp: , Rfl:    denosumab (PROLIA) 60 MG/ML SOSY injection, Inject 60 mg into the skin every 6 (six) months., Disp: 1 mL, Rfl: 0   DULoxetine (CYMBALTA) 30 MG capsule, Take 1 capsule (30 mg total) by mouth daily. Reduction in dose, Disp: 90 capsule, Rfl: 3   ezetimibe (ZETIA) 10 MG tablet, TAKE ONE TABLET BY MOUTH DAILY, Disp: 90 tablet, Rfl: 0   fluticasone (FLONASE) 50 MCG/ACT nasal spray, Place 2 sprays into both nostrils daily., Disp: 48 g, Rfl: 0   levothyroxine (SYNTHROID) 125 MCG tablet, Take 1 tablet (125 mcg total) by mouth daily before breakfast., Disp: 90 tablet, Rfl: 3   losartan (COZAAR) 50 MG tablet, TAKE ONE TABLET BY MOUTH DAILY, Disp: 90  tablet, Rfl: 0   magnesium 30 MG tablet, Take 30 mg by mouth 2 (two) times daily., Disp: , Rfl:    methocarbamol (ROBAXIN) 500 MG tablet, TAKE 1 TABLET BY MOUTH EVERY 6 HOURS AS NEEDED FOR MUSCLE SPASMS (USE SPARINGLY), Disp: 40 tablet, Rfl: 3   montelukast (SINGULAIR) 10 MG tablet, TAKE ONE TABLET BY MOUTH EVERY EVENING, Disp: 90 tablet, Rfl: 0   omeprazole (PRILOSEC) 20 MG capsule, Take 1 capsule (20 mg total) by mouth daily., Disp: 90 capsule, Rfl: 3   Rimegepant Sulfate (NURTEC) 75 MG TBDP, Take 1 tablet by mouth every other day. For migraine prevention, Disp: 16 tablet,  Rfl: prn   SEMAGLUTIDE,0.25 OR 0.5MG /DOS, [place], INJECT 68 UNITS UNDER THE SKIN WEEKLY FOR 4 WEEKS, THEN INCREASE TO 96 UNITS WEEKLY thereafter, Disp: , Rfl:    Semaglutide-Weight Management (WEGOVY) 0.5 MG/0.5ML SOAJ, Compounded per [facility] drug.  Advance as directed monthly. Inject subcutaneous weekly., Disp: 2 mL, Rfl: 0   ibuprofen (ADVIL) 800 MG tablet, Take 1 tablet (800 mg total) by mouth every 8 (eight) hours as needed for headache or moderate pain (pain score 4-6)., Disp: 90 tablet, Rfl: 0  Current Facility-Administered Medications:    denosumab (PROLIA) injection 60 mg, 60 mg, Subcutaneous, Once, Alexyia Guarino M, DO  Allergies  Allergen Reactions   Bee Venom     Cant breath, swell up   [employer] (Non-Screening) Hives      dizziness   Citalopram Hydrobromide     Mood swings, gets "really really mean"   Gabapentin Other (See Comments)    Hallucinations   Statins     Myalgia and muscle pain   Tramadol     unknown     ROS: Review of Systems Pertinent items noted in HPI and remainder of comprehensive ROS otherwise negative.    Physical exam BP (!) 143/87   Pulse 67   Temp 98.6 F (37 C)   Ht 5' (1.524 m)   Wt 192 lb 3.2 oz (87.2 kg)   SpO2 97%   BMI 37.54 kg/m  General appearance: alert, cooperative, appears stated age, and morbidly obese Head: Normocephalic, without obvious  abnormality, atraumatic Eyes: negative findings: lids and lashes normal, conjunctivae and sclerae normal, corneas clear, and pupils equal, round, reactive to light and accomodation Ears: normal TM's and external ear canals both ears Nose: Nares normal. Septum midline. Mucosa normal. No drainage or sinus tenderness. Throat: lips, mucosa, and tongue normal; teeth and gums normal Neck: no adenopathy, supple, symmetrical, trachea midline, and thyroid not enlarged, symmetric, no tenderness/mass/nodules Back: symmetric, no curvature. ROM normal. No CVA tenderness. Lungs: clear to auscultation bilaterally Heart: regular rate and rhythm, S1, S2 normal, no murmur, click, rub or gallop Abdomen: soft, non-tender; bowel sounds normal; no masses,  no organomegaly Extremities: extremities normal, atraumatic, no cyanosis or edema Pulses: 2+ and symmetric Skin: Skin color, texture, turgor normal. No rashes or lesions Lymph nodes: Cervical, supraclavicular, and axillary nodes normal. Neurologic: Grossly normal  MSK: Tenderness to palpation along bilateral upper mid back     01/10/2024    1:01 PM 08/23/2023   11:00 AM 07/19/2023    3:39 PM  Depression screen PHQ 2/9  Decreased Interest 0 0 0  Down, Depressed, Hopeless 0 0 0  PHQ - 2 Score 0 0 0  Altered sleeping 1 0 1  Tired, decreased energy 1 0 1  Change in appetite 0 0 0  Feeling bad or failure about yourself  0 0 0  Trouble concentrating 2 0 1  Moving slowly or fidgety/restless 0 0 0  Suicidal thoughts 0 0 0  PHQ-9 Score 4 0 3  Difficult doing work/chores Not difficult at all Not difficult at all Not difficult at all      01/10/2024    1:06 PM 01/10/2024    1:01 PM 08/23/2023   11:00 AM 07/19/2023    3:39 PM  GAD 7 : Generalized Anxiety Score  Nervous, Anxious, on Edge 1 0 0 1  Control/stop worrying 0 0 0 0  Worry too much - different things 0 0 0 0  Trouble relaxing 1 0 0 0  Restless 1 0 0 0  Easily annoyed or irritable 0 0 0 0  Afraid -  awful might happen 0 0 0 0  Total GAD 7 Score 3 0 0 1  Anxiety Difficulty Not difficult at all Not difficult at all Not difficult at all Not difficult at all     Assessment/ Plan: [name] here for annual physical exam.   Annual physical exam  Essential hypertension - Plan: CMP14+EGFR  Hypothyroidism due to acquired atrophy of thyroid - Plan: TSH + free T4, CBC  Morbid obesity (HCC) - Plan: VITAMIN D 25 Hydroxy (Vit-D Deficiency, Fractures), CBC, Bayer DCA Hb A1c Waived  Age-related osteoporosis without current pathological fracture - Plan: CMP14+EGFR, VITAMIN D 25 Hydroxy (Vit-D Deficiency, Fractures), CBC  Pure hypercholesterolemia - Plan: Lipid Panel  Nephrolithiasis - Plan: CANCELED: Urinalysis  Fibromyalgia - Plan: ibuprofen (ADVIL) 800 MG tablet  Encounter for immunization - Plan: Flu Vaccine Trivalent High Dose (Fluad)  Fasting labs collected.  Medications have been renewed as requested.  I did encourage her to try and use the Cymbalta as a twice daily dosing with 30 mg.  60 mg is a little too potent as a singular dose but I wonder if split would it be helpful.  Check vitamin D, calcium and renal function given osteoporosis and treatment with Prolia.  I intend on getting a urinalysis for her at the request of her urologist as part of her yearly exam but she notes that she just had 1 done a couple of months ago and did not want to repeat today  Influenza vaccination administered  Counseled on healthy lifestyle choices, including diet (rich in fruits, vegetables and lean meats and low in salt and simple carbohydrates) and exercise (at least 30 minutes of moderate physical activity daily).  Patient to follow up 39m  Vivek Grealish M. Nadine Counts, DO

## 2024-01-11 ENCOUNTER — Encounter: Payer: Self-pay | Admitting: Family Medicine

## 2024-01-11 LAB — CBC
Hematocrit: 45.2 % (ref 34.0–46.6)
Hemoglobin: 14.6 g/dL (ref 11.1–15.9)
MCH: 29.4 pg (ref 26.6–33.0)
MCHC: 32.3 g/dL (ref 31.5–35.7)
MCV: 91 fL (ref 79–97)
Platelets: 293 10*3/uL (ref 150–450)
RBC: 4.96 x10E6/uL (ref 3.77–5.28)
RDW: 13.8 % (ref 11.7–15.4)
WBC: 9.2 10*3/uL (ref 3.4–10.8)

## 2024-01-11 LAB — LIPID PANEL
Cholesterol, Total: 151 mg/dL (ref 100–199)
HDL: 46 mg/dL (ref >39)
LDL CALC COMMENT:: 3.3 ratio (ref 0.0–4.4)
LDL Chol Calc (NIH): 79 mg/dL (ref 0–99)
Triglycerides: 148 mg/dL (ref 0–149)
VLDL Cholesterol Cal: 26 mg/dL (ref 5–40)

## 2024-01-11 LAB — CMP14+EGFR
ALT: 9 IU/L (ref 0–32)
AST: 12 IU/L (ref 0–40)
Albumin: 3.8 g/dL — ABNORMAL LOW (ref 3.9–4.9)
Alkaline Phosphatase: 73 IU/L (ref 44–121)
BUN/Creatinine Ratio: 14 (ref 12–28)
BUN: 10 mg/dL (ref 8–27)
Bilirubin Total: 0.3 mg/dL (ref 0.0–1.2)
CO2: 26 mmol/L (ref 20–29)
Calcium: 9.2 mg/dL (ref 8.7–10.3)
Chloride: 104 mmol/L (ref 96–106)
Creatinine, Ser: 0.74 mg/dL (ref 0.57–1.00)
Globulin, Total: 2 g/dL (ref 1.5–4.5)
Glucose: 82 mg/dL (ref 70–99)
Potassium: 3.7 mmol/L (ref 3.5–5.2)
Sodium: 142 mmol/L (ref 134–144)
Total Protein: 5.8 g/dL — ABNORMAL LOW (ref 6.0–8.5)
eGFR: 89 mL/min/{1.73_m2} (ref >59)

## 2024-01-11 LAB — TSH+FREE T4
Free T4: 1.57 ng/dL (ref 0.82–1.77)
TSH: 1.08 u[IU]/mL (ref 0.450–4.500)

## 2024-01-11 LAB — VITAMIN D 25 HYDROXY (VIT D DEFICIENCY, FRACTURES): Vit D, 25-Hydroxy: 126 ng/mL — ABNORMAL HIGH (ref 30.0–100.0)

## 2024-01-21 ENCOUNTER — Telehealth: Payer: Self-pay

## 2024-01-21 DIAGNOSIS — M810 Age-related osteoporosis without current pathological fracture: Secondary | ICD-10-CM

## 2024-01-21 MED ORDER — DENOSUMAB 60 MG/ML ~~LOC~~ SOSY
60.0000 mg | PREFILLED_SYRINGE | Freq: Once | SUBCUTANEOUS | Status: AC
  Administered 2024-02-11: 60 mg via SUBCUTANEOUS

## 2024-01-21 NOTE — Telephone Encounter (Signed)
 Prolia ordered for PA team to review verification benefits

## 2024-01-25 ENCOUNTER — Telehealth: Payer: Self-pay

## 2024-01-25 NOTE — Telephone Encounter (Signed)
Prolia VOB initiated via [email or web address]  Next Prolia inj DUE: NOW

## 2024-01-27 ENCOUNTER — Other Ambulatory Visit (HOSPITAL_COMMUNITY): Payer: Self-pay

## 2024-01-27 NOTE — Telephone Encounter (Signed)
Pt ready for scheduling for PROLIA on or after : 01/27/24  Out-of-pocket cost due at time of visit: $332  Number of injection/visits approved: ---  Primary: [employer] Prolia co-insurance: 20% Admin fee co-insurance: 0%  Secondary: --- Prolia co-insurance:  Admin fee co-insurance:   Medical Benefit Details: Date Benefits were checked: 01/25/24 Deductible: NO/ Coinsurance: 20%/ Admin Fee: 0%  Prior Auth: N/A PA# Expiration Date:   # of doses approved:  Pharmacy benefit: Copay $250 If patient wants fill through the pharmacy benefit please send prescription to: [employer], and include estimated need by date in rx notes. Pharmacy will ship medication directly to the office.  Patient NOT eligible for Prolia Copay Card. Copay Card can make patient's cost as little as $25. Link to apply: https://www.amgensupportplus.com/copay  ** This summary of benefits is an estimation of the patient's out-of-pocket cost. Exact cost may very based on individual plan coverage.

## 2024-02-11 ENCOUNTER — Telehealth: Payer: Self-pay

## 2024-02-11 ENCOUNTER — Ambulatory Visit (INDEPENDENT_AMBULATORY_CARE_PROVIDER_SITE_OTHER): Payer: PPO

## 2024-02-11 DIAGNOSIS — M810 Age-related osteoporosis without current pathological fracture: Secondary | ICD-10-CM | POA: Diagnosis not present

## 2024-02-11 NOTE — Telephone Encounter (Signed)
 6 months

## 2024-02-11 NOTE — Progress Notes (Signed)
 Patient is in office today for a nurse visit for  PROLIA . Patient Injection was given in the  Right arm. Patient tolerated injection well.

## 2024-02-11 NOTE — Telephone Encounter (Signed)
 Patient notified and verbalized understanding.

## 2024-02-11 NOTE — Telephone Encounter (Signed)
 Patient has backed off on Vit D since her levels were elevated. She wants to know when you want her to have her level checked again? Please review and advise

## 2024-02-15 NOTE — Telephone Encounter (Signed)
 See telephone encounter on 01/25/24

## 2024-02-19 ENCOUNTER — Other Ambulatory Visit: Payer: Self-pay | Admitting: Family Medicine

## 2024-02-19 DIAGNOSIS — M797 Fibromyalgia: Secondary | ICD-10-CM

## 2024-04-07 ENCOUNTER — Ambulatory Visit (INDEPENDENT_AMBULATORY_CARE_PROVIDER_SITE_OTHER)

## 2024-04-07 VITALS — Ht 60.0 in | Wt 186.0 lb

## 2024-04-07 DIAGNOSIS — Z0000 Encounter for general adult medical examination without abnormal findings: Secondary | ICD-10-CM | POA: Diagnosis not present

## 2024-04-07 DIAGNOSIS — Z1231 Encounter for screening mammogram for malignant neoplasm of breast: Secondary | ICD-10-CM

## 2024-04-07 NOTE — Patient Instructions (Signed)
 Ms. Trantham , Thank you for taking time to come for your Medicare Wellness Visit. I appreciate your ongoing commitment to your health goals. Please review the following plan we discussed and let me know if I can assist you in the future.   Referrals/Orders/Follow-Ups/Clinician Recommendations:    Next Medicare AWV: May 71m 2026 at 3:10 pm video visit.      Aim for 30 minutes of exercise or brisk walking, 6-8 glasses of water, and 5 servings of fruits and vegetables each day.   Please call the number listed below to schedule your appointment for your yearly mammogram  [facility] [address], [place] [address] [phone]  This is a list of the screening recommended for you and due dates:  Health Maintenance  Topic Date Due   COVID-19 Vaccine (2 - Moderna risk series) 05/06/2020   Zoster (Shingles) Vaccine (2 of 2) 04/09/2024*   DTaP/Tdap/Td vaccine (1 - Tdap) 01/09/2025*   Pneumonia Vaccine (1 of 2 - PCV) 01/09/2025*   Mammogram  04/25/2024   Flu Shot  07/14/2024   Medicare Annual Wellness Visit  04/07/2025   DEXA scan (bone density measurement)  06/09/2025   Cologuard (Stool DNA test)  04/05/2026   Hepatitis C Screening  Completed   HPV Vaccine  Aged Out   Meningitis B Vaccine  Aged Out  *Topic was postponed. The date shown is not the original due date.    Advanced directives: (Declined) Advance directive discussed with you today. Even though you declined this today, please call our office should you change your mind, and we can give you the proper paperwork for you to fill out. Advance Care Planning is important because it:  [x]  Makes sure you receive the medical care that is consistent with your values, goals, and preferences  [x]  It provides guidance to your family and loved ones and it also reduces their decisional burden about whether or not they are making the right decisions based on what you want done  Follow the link provided in your after  visit summary or read over the paperwork we have mailed to you to help you started getting your Advance Directives in place. If you need assistance in completing these, please reach out to us  so that we can help you!  Next Medicare Annual Wellness Visit scheduled for next year: yes  Understanding Your Risk for Falls Millions of people have serious injuries from falls each year. It is important to understand your risk of falling. Talk with your health care provider about your risk and what you can do to lower it. If you do have a serious fall, make sure to tell your provider. Falling once raises your risk of falling again. How can falls affect me? Serious injuries from falls are common. These include: Broken bones, such as hip fractures. Head injuries, such as traumatic brain injuries (TBI) or concussions. A fear of falling can cause you to avoid activities and stay at home. This can make your muscles weaker and raise your risk for a fall. What can increase my risk? There are a number of risk factors that increase your risk for falling. The more risk factors you have, the higher your risk of falling. Serious injuries from a fall happen most often to people who are older than 68 years old. Teenagers and young adults ages 68-29 are also at higher risk. Common risk factors include: Weakness in the lower body. Being generally weak or confused due to long-term (  chronic) illness. Dizziness or balance problems. Poor vision. Medicines that cause dizziness or drowsiness. These may include: Medicines for your blood pressure, heart, anxiety, insomnia, or swelling (edema). Pain medicines. Muscle relaxants. Other risk factors include: Drinking alcohol. Having had a fall in the past. Having foot pain or wearing improper footwear. Working at a dangerous job. Having any of the following in your home: Tripping hazards, such as floor clutter or loose rugs. Poor lighting. Pets. Having dementia or memory  loss. What actions can I take to lower my risk of falling?  Physical activity Stay physically fit. Do strength and balance exercises. Consider taking a regular class to build strength and balance. Yoga and tai chi are good options. Vision Have your eyes checked every year and your prescription for glasses or contacts updated as needed. Shoes and walking aids Wear non-skid shoes. Wear shoes that have rubber soles and low heels. Do not wear high heels. Do not walk around the house in socks or slippers. Use a cane or walker as told by your provider. Home safety Attach secure railings on both sides of your stairs. Install grab bars for your bathtub, shower, and toilet. Use a non-skid mat in your bathtub or shower. Attach bath mats securely with double-sided, non-slip rug tape. Use good lighting in all rooms. Keep a flashlight near your bed. Make sure there is a clear path from your bed to the bathroom. Use night-lights. Do not use throw rugs. Make sure all carpeting is taped or tacked down securely. Remove all clutter from walkways and stairways, including extension cords. Repair uneven or broken steps and floors. Avoid walking on icy or slippery surfaces. Walk on the grass instead of on icy or slick sidewalks. Use ice melter to get rid of ice on walkways in the winter. Use a cordless phone. Questions to ask your health care provider Can you help me check my risk for a fall? Do any of my medicines make me more likely to fall? Should I take a vitamin D  supplement? What exercises can I do to improve my strength and balance? Should I make an appointment to have my vision checked? Do I need a bone density test to check for weak bones (osteoporosis)? Would it help to use a cane or a walker? Where to find more information Centers for Disease Control and Prevention, STEADI: TonerPromos.no Community-Based Fall Prevention Programs: TonerPromos.no [employer] on Aging: BaseRingTones.pl Contact a health care  provider if: You fall at home. You are afraid of falling at home. You feel weak, drowsy, or dizzy. This information is not intended to replace advice given to you by your health care provider. Make sure you discuss any questions you have with your health care provider. Document Revised: 08/03/2022 Document Reviewed: 08/03/2022 Elsevier Patient Education  2024 [employer].

## 2024-04-07 NOTE — Progress Notes (Signed)
 Subjective:   Alicia Russo is a 68 y.o. who presents for a Medicare Wellness preventive visit.  Visit Complete: Virtual I connected with  Stanley S Hanauer on 04/07/24 by a audio enabled telemedicine application and verified that I am speaking with the correct person using two identifiers.  Patient Location: Home  Provider Location: Home Office  I discussed the limitations of evaluation and management by telemedicine. The patient expressed understanding and agreed to proceed.  Vital Signs: Because this visit was a virtual/telehealth visit, some criteria may be missing or patient reported. Any vitals not documented were not able to be obtained and vitals that have been documented are patient reported.  VideoDeclined- This patient declined Librarian, academic. Therefore the visit was completed with audio only.  Persons Participating in Visit: Patient.  AWV Questionnaire: No: Patient Medicare AWV questionnaire was not completed prior to this visit.  Cardiac Risk Factors include: advanced age (>27men, >17 women);hypertension;dyslipidemia;obesity (BMI >30kg/m2)     Objective:    Today's Vitals   04/07/24 1317 04/07/24 1322  Weight: 186 lb (84.4 kg)   Height: 5' (1.524 m)   PainSc: 4  4   PainLoc: Rib Cage    Body mass index is 36.33 kg/m.     04/07/2024    1:39 PM 04/06/2023    9:53 AM 04/02/2022    9:58 AM 04/01/2021   10:05 AM 03/26/2020    2:58 PM 04/29/2017    3:48 PM 02/16/2017    9:37 AM  Advanced Directives  Does Patient Have a Medical Advance Directive? No Yes Yes Yes Yes Yes Yes  Type of Furniture conservator/restorer;Living will Healthcare Power of [place];Living will Healthcare Power of [place];Living will Healthcare Power of [employer] of [place];Living will Living will;Healthcare Power of Attorney  Does patient want to make changes to medical advance directive?     No - Patient declined No - Patient  declined No - Patient declined  Copy of Healthcare Power of Attorney in Chart?  No - copy requested No - copy requested No - copy requested No - copy requested No - copy requested No - copy requested  Would patient like information on creating a medical advance directive? No - Patient declined          Current Medications (verified) Outpatient Encounter Medications as of 04/07/2024  Medication Sig   albuterol  (PROVENTIL ) (2.5 MG/3ML) 0.083% nebulizer solution USE ONE vial in nebulizer EVERY 6 HOURS AS NEEDED wheezing SHORTNESS OF BREATH   albuterol  (VENTOLIN  HFA) 108 (90 Base) MCG/ACT inhaler INHALE 2 PUFFS INTO THE LUNGS EVERY 6 HOURS AS NEEDED FOR WHEEZE OR SHORTNESS OF BREATH   ALPRAZolam  (XANAX ) 0.5 MG tablet Take 0.5-1 tablets (0.25-0.5 mg total) by mouth 2 (two) times daily as needed for anxiety (put on file).   Biotin 10 MG TABS Take 1 tablet by mouth 2 (two) times daily.    budesonide -formoterol  (SYMBICORT ) 80-4.5 MCG/ACT inhaler Inhale 2 puffs into the lungs in the morning and at bedtime.   cetirizine  (ZYRTEC ) 10 MG tablet Take 1 tablet (10 mg total) by mouth daily.   Cholecalciferol (VITAMIN D3) 2000 UNITS TABS Take 1 tablet by mouth 2 (two) times daily.   Cyanocobalamin (VITAMIN B-12 PO) Take 1 tablet by mouth 2 (two) times daily.   denosumab  (PROLIA ) 60 MG/ML SOSY injection Inject 60 mg into the skin every 6 (six) months.   DULoxetine  (CYMBALTA ) 30 MG capsule TAKE 1 CAPSULE BY MOUTH  EVERY DAY   ezetimibe  (ZETIA ) 10 MG tablet TAKE ONE TABLET BY MOUTH DAILY   fluticasone  (FLONASE ) 50 MCG/ACT nasal spray Place 2 sprays into both nostrils daily.   ibuprofen  (ADVIL ) 800 MG tablet Take 1 tablet (800 mg total) by mouth every 8 (eight) hours as needed for headache or moderate pain (pain score 4-6).   levothyroxine  (SYNTHROID ) 125 MCG tablet Take 1 tablet (125 mcg total) by mouth daily before breakfast.   losartan  (COZAAR ) 50 MG tablet TAKE ONE TABLET BY MOUTH DAILY   magnesium 30 MG tablet  Take 30 mg by mouth 2 (two) times daily.   methocarbamol  (ROBAXIN ) 500 MG tablet TAKE 1 TABLET BY MOUTH EVERY 6 HOURS AS NEEDED FOR MUSCLE SPASMS (USE SPARINGLY)   montelukast  (SINGULAIR ) 10 MG tablet TAKE ONE TABLET BY MOUTH EVERY EVENING   omeprazole  (PRILOSEC) 20 MG capsule Take 1 capsule (20 mg total) by mouth daily.   Rimegepant Sulfate (NURTEC) 75 MG TBDP Take 1 tablet by mouth every other day. For migraine prevention   SEMAGLUTIDE ,0.25 OR 0.5MG /DOS, [employer] INJECT 68 UNITS UNDER THE SKIN WEEKLY FOR 4 WEEKS, THEN INCREASE TO 96 UNITS WEEKLY thereafter   Semaglutide -Weight Management (WEGOVY ) 0.5 MG/0.5ML SOAJ Compounded per [facility] drug.  Advance as directed monthly. Inject subcutaneous weekly.   Facility-Administered Encounter Medications as of 04/07/2024  Medication   denosumab  (PROLIA ) injection 60 mg    Allergies (verified) Bee venom, Cherry flavoring agent (non-screening), Citalopram hydrobromide, Gabapentin, Statins, and Tramadol   History: Past Medical History:  Diagnosis Date   Allergy    Anxiety    Asthma    AVM (arteriovenous malformation)    Intracranial with seizures   Closed fracture of left distal radius    Fibromyalgia    GERD (gastroesophageal reflux disease)    Hyperlipidemia    Hypothyroidism    Plantar fasciitis of right foot    Seizures (HCC)    AVM repair   Past Surgical History:  Procedure Laterality Date   APPENDECTOMY  1982   BRAIN SURGERY  AVM 1999   CHOLECYSTECTOMY     CRANIOTOMY     for AVM   FOOT SURGERY  2016   Due to plantar fasciitis   ORIF WRIST FRACTURE Left 02/16/2017   Procedure: OPEN REDUCTION INTERNAL FIXATION (ORIF) LEFT WRIST FRACTURE;  Surgeon: Saundra Curl, MD;  Location: [facility];  Service: Orthopedics;  Laterality: Left;   Family History  Problem Relation Age of Onset   Emphysema Father        died at age 62   COPD Father    Hypertension Mother    Cancer Mother    Heart disease Mother    Vision loss  Mother    Diabetes Brother    Kidney disease Brother    Diabetes Brother    Kidney disease Paternal Grandmother    Social History   Socioeconomic History   Marital status: Single    Spouse name: Not on file   Number of children: 2   Years of education: Not on file   Highest education level: GED or equivalent  Occupational History    Comment: disability  Tobacco Use   Smoking status: Former    Current packs/day: 0.00    Types: Cigarettes    Start date: 04/30/1979    Quit date: 04/30/1999    Years since quitting: 24.9   Smokeless tobacco: Never  Vaping Use   Vaping status: Never Used  Substance and Sexual Activity  Alcohol use: No    Alcohol/week: 0.0 standard drinks of alcohol   Drug use: No   Sexual activity: Not Currently  Other Topics Concern   Not on file  Social History Narrative   Lives alone. Her mother lives 15 minutes away. Both children out of town, but daughter visits often.   Social Drivers of Corporate investment banker Strain: Low Risk  (04/07/2024)   Overall Financial Resource Strain (CARDIA)    Difficulty of Paying Living Expenses: Not hard at all  Food Insecurity: No Food Insecurity (04/07/2024)   Hunger Vital Sign    Worried About Running Out of Food in the Last Year: Never true    Ran Out of Food in the Last Year: Never true  Transportation Needs: No Transportation Needs (04/07/2024)   PRAPARE - Administrator, Civil Service (Medical): No    Lack of Transportation (Non-Medical): No  Physical Activity: Sufficiently Active (04/07/2024)   Exercise Vital Sign    Days of Exercise per Week: 7 days    Minutes of Exercise per Session: 30 min  Recent Concern: Physical Activity - Insufficiently Active (01/10/2024)   Exercise Vital Sign    Days of Exercise per Week: 4 days    Minutes of Exercise per Session: 30 min  Stress: No Stress Concern Present (04/07/2024)   [employer] of Occupational Health - Occupational Stress Questionnaire     Feeling of Stress : Not at all  Social Connections: Moderately Integrated (04/07/2024)   Social Connection and Isolation Panel [NHANES]    Frequency of Communication with Friends and Family: More than three times a week    Frequency of Social Gatherings with Friends and Family: More than three times a week    Attends Religious Services: More than 4 times per year    Active Member of [employer] or Organizations: Yes    Attends Engineer, structural: More than 4 times per year    Marital Status: Divorced    Tobacco Counseling Counseling given: Yes    Clinical Intake:  Pre-visit preparation completed: Yes  Pain : 0-10 Pain Score: 4  Pain Type: Chronic pain Pain Location: Rib cage Pain Orientation: Right, Left Pain Descriptors / Indicators: Constant, Aching, Dull Pain Onset: More than a month ago Pain Frequency: Constant     BMI - recorded: 36.33 Nutritional Status: BMI > 30  Obese Nutritional Risks: None Diabetes: No  Lab Results  Component Value Date   HGBA1C 5.0 01/10/2024   HGBA1C 5.3 03/16/2022     How often do you need to have someone help you when you read instructions, pamphlets, or other written materials from your doctor or pharmacy?: 1 - Never  Interpreter Needed?: No  Information entered by :: Sally Crazier CMA   Activities of Daily Living     04/07/2024    1:25 PM  In your present state of health, do you have any difficulty performing the following activities:  Hearing? 0  Vision? 0  Difficulty concentrating or making decisions? 0  Walking or climbing stairs? 1  Comment fibromyalgia. uses a cane or walker as needed.  Dressing or bathing? 0  Doing errands, shopping? 0  Preparing Food and eating ? N  Using the Toilet? N  In the past six months, have you accidently leaked urine? N  Do you have problems with loss of bowel control? N  Managing your Medications? N  Managing your Finances? N  Housekeeping or managing your Housekeeping? N  Patient  Care Team: Eliodoro Guerin, DO as PCP - General (Family Medicine)  Indicate any recent Medical Services you may have received from other than [facility] providers in the past year (date may be approximate).     Assessment:   This is a routine wellness examination for [employer].  Hearing/Vision screen Hearing Screening - Comments:: Patient denies any hearing difficulties.   Vision Screening - Comments:: Wears rx glasses - up to date with routine eye exams  Patient sees Dr. Olin Bertin at [facility]    Goals Addressed             This Visit's Progress    DIET - EAT MORE FRUITS AND VEGETABLES   On track    Exercise 150 min/wk Moderate Activity   On track      Depression Screen     04/07/2024    1:39 PM 01/10/2024    1:01 PM 08/23/2023   11:00 AM 07/19/2023    3:39 PM 06/09/2023    3:45 PM 04/06/2023    9:52 AM 09/15/2022    3:08 PM  PHQ 2/9 Scores  PHQ - 2 Score 0 0 0 0 0 0 0  PHQ- 9 Score 0 4 0 3 5      Fall Risk     04/07/2024    1:31 PM 08/23/2023   11:00 AM 07/19/2023    3:39 PM 06/09/2023    3:45 PM 04/06/2023    9:51 AM  Fall Risk   Falls in the past year? 0 0 0 0 0  Number falls in past yr: 0 0 0  0  Injury with Fall? 0 0 0  0  Risk for fall due to : No Fall Risks No Fall Risks No Fall Risks  No Fall Risks  Follow up Falls prevention discussed;Falls evaluation completed Falls evaluation completed Falls evaluation completed Falls evaluation completed Falls prevention discussed    MEDICARE RISK AT HOME:  Medicare Risk at Home Any stairs in or around the home?: Yes If so, are there any without handrails?: No Home free of loose throw rugs in walkways, pet beds, electrical cords, etc?: No Adequate lighting in your home to reduce risk of falls?: Yes Life alert?: No Use of a cane, walker or w/c?: Yes (as needed) Grab bars in the bathroom?: Yes Shower chair or bench in shower?: No Elevated toilet seat or a handicapped toilet?: Yes  TIMED UP AND GO:  Was the test  performed?  No  Cognitive Function: Not completed. Patient has a history of craniotomy for AVM which affected her memory    04/07/2024    1:34 PM 04/29/2017    4:13 PM  MMSE - Mini Mental State Exam  Not completed: Unable to complete   Orientation to time  5  Orientation to Place  5  Registration  3  Attention/ Calculation  5  Recall  3  Language- name 2 objects  2  Language- repeat  1  Language- follow 3 step command  1  Language- read & follow direction  1  Write a sentence  1  Copy design  0  Total score  27        04/06/2023    9:54 AM 04/02/2022    9:59 AM 03/26/2020    3:00 PM  6CIT Screen  What Year? 0 points 0 points 0 points  What month? 0 points 0 points 0 points  What time? 0 points 0 points 0 points  Count  back from 20 0 points 0 points 0 points  Months in reverse 0 points 0 points 0 points  Repeat phrase 0 points 2 points 0 points  Total Score 0 points 2 points 0 points    Immunizations Immunization History  Administered Date(s) Administered   Fluad Trivalent(High Dose 65+) 01/10/2024   Influenza Inj Mdck Quad Pf 09/02/2021   Influenza Split 08/28/2020, 09/02/2021   Influenza,inj,Quad PF,6+ Mos 09/16/2020   Influenza,inj,quad, With Preservative 09/01/2018, 07/27/2019   Influenza-Unspecified 10/22/2008, 08/26/2016, 09/22/2017, 08/24/2018, 09/02/2021   Moderna Sars-Covid-2 Vaccination 04/08/2020   Zoster Recombinant(Shingrix ) 09/15/2022    Screening Tests Health Maintenance  Topic Date Due   COVID-19 Vaccine (2 - Moderna risk series) 05/06/2020   Zoster Vaccines- Shingrix  (2 of 2) 04/09/2024 (Originally 11/10/2022)   DTaP/Tdap/Td (1 - Tdap) 01/09/2025 (Originally 09/14/1975)   Pneumonia Vaccine 20+ Years old (1 of 2 - PCV) 01/09/2025 (Originally 09/14/1975)   MAMMOGRAM  04/25/2024   INFLUENZA VACCINE  07/14/2024   Medicare Annual Wellness (AWV)  04/07/2025   DEXA SCAN  06/09/2025   Fecal DNA (Cologuard)  04/05/2026   Hepatitis C Screening  Completed    HPV VACCINES  Aged Out   Meningococcal B Vaccine  Aged Out    Health Maintenance  Health Maintenance Due  Topic Date Due   COVID-19 Vaccine (2 - Moderna risk series) 05/06/2020   Health Maintenance Items Addressed: Mammogram ordered  Additional Screening:  Vision Screening: Recommended annual ophthalmology exams for early detection of glaucoma and other disorders of the eye.  Dental Screening: Recommended annual dental exams for proper oral hygiene  Community Resource Referral / Chronic Care Management: CRR required this visit?  No   CCM required this visit?  No     Plan:     I have personally reviewed and noted the following in the patient's chart:   Medical and social history Use of alcohol, tobacco or illicit drugs  Current medications and supplements including opioid prescriptions. Patient is not currently taking opioid prescriptions. Functional ability and status Nutritional status Physical activity Advanced directives List of other physicians Hospitalizations, surgeries, and ER visits in previous 12 months Vitals Screenings to include cognitive, depression, and falls Referrals and appointments  In addition, I have reviewed and discussed with patient certain preventive protocols, quality metrics, and best practice recommendations. A written personalized care plan for preventive services as well as general preventive health recommendations were provided to patient.      Tylerjames Hoglund, CMA   04/07/2024   After Visit Summary: (MyChart) Due to this being a telephonic visit, the after visit summary with patients personalized plan was offered to patient via MyChart   Notes: Please refer to Routing Comments.

## 2024-05-10 ENCOUNTER — Other Ambulatory Visit (HOSPITAL_COMMUNITY): Payer: Self-pay | Admitting: Family Medicine

## 2024-05-10 DIAGNOSIS — Z1231 Encounter for screening mammogram for malignant neoplasm of breast: Secondary | ICD-10-CM

## 2024-05-11 ENCOUNTER — Other Ambulatory Visit: Payer: Self-pay

## 2024-05-11 ENCOUNTER — Encounter: Payer: Self-pay | Admitting: Family Medicine

## 2024-05-11 ENCOUNTER — Other Ambulatory Visit: Payer: Self-pay | Admitting: Medical Genetics

## 2024-05-11 DIAGNOSIS — J4520 Mild intermittent asthma, uncomplicated: Secondary | ICD-10-CM

## 2024-05-11 MED ORDER — BUDESONIDE-FORMOTEROL FUMARATE 80-4.5 MCG/ACT IN AERO: 2.0000 | INHALATION_SPRAY | Freq: Two times a day (BID) | RESPIRATORY_TRACT | 6 refills | Status: DC

## 2024-05-11 NOTE — Telephone Encounter (Unsigned)
 Copied from CRM [id number]. Topic: Clinical - Prescription Issue >> May 11, 2024  2:37 PM Opal Bill wrote: Reason for CRM: Pharmacy says the new rx sent in for budesonide -formoterol  (SYMBICORT ) 80-4.5 MCG/ACT inhaler needs a prior authorization. Please follow up on this asap and contact patient once this has been initiated.

## 2024-05-16 ENCOUNTER — Other Ambulatory Visit: Payer: Self-pay | Admitting: Family Medicine

## 2024-05-16 ENCOUNTER — Telehealth: Payer: Self-pay

## 2024-05-16 DIAGNOSIS — J4520 Mild intermittent asthma, uncomplicated: Secondary | ICD-10-CM

## 2024-05-16 MED ORDER — QVAR REDIHALER 80 MCG/ACT IN AERB: 2.0000 | INHALATION_SPRAY | Freq: Two times a day (BID) | RESPIRATORY_TRACT | 12 refills | Status: DC

## 2024-05-16 NOTE — Telephone Encounter (Signed)
 Pt has been been notified , no further questions

## 2024-05-16 NOTE — Telephone Encounter (Signed)
 Copied from CRM [id number]. Topic: Clinical - Medication Prior Auth >> May 16, 2024 11:40 AM Alicia Russo wrote: Reason for CRM: patient called to f/u on the new rx sent in for budesonide -formoterol  (SYMBICORT ) 80-4.5 MCG/ACT inhaler that needs a prior authorization. Please follow up on this asap and contact patient once this has been initiated.

## 2024-05-16 NOTE — Telephone Encounter (Signed)
 duplicate

## 2024-05-16 NOTE — Telephone Encounter (Signed)
 I'm not really sure what's on her "preferred list", that should be something she has access to since it's her insurance but I did submit an alternative.  If it's not covered, she will have to let me know what is on her formulary.

## 2024-05-16 NOTE — Telephone Encounter (Signed)
 Copied from CRM [id number]. Topic: Clinical - Medication Question >> May 16, 2024 11:45 AM Alicia Russo T wrote: Reason for CRM: patient said if the script for budesonide -formoterol  (SYMBICORT ) 80-4.5 MCG/ACT inhaler was an issue with the PA provider could send in a script for a med that is on the approved medication list. Patient is requesting feedback through mychart

## 2024-05-17 ENCOUNTER — Ambulatory Visit (HOSPITAL_COMMUNITY)
Admission: RE | Admit: 2024-05-17 | Discharge: 2024-05-17 | Disposition: A | Discharge status: Home / Self Care | Source: Ambulatory Visit

## 2024-05-17 ENCOUNTER — Encounter: Payer: Self-pay | Admitting: Family Medicine

## 2024-05-17 DIAGNOSIS — Z1231 Encounter for screening mammogram for malignant neoplasm of breast: Secondary | ICD-10-CM | POA: Insufficient documentation

## 2024-05-24 ENCOUNTER — Other Ambulatory Visit: Payer: Self-pay

## 2024-05-24 ENCOUNTER — Ambulatory Visit: Admission: EM | Admit: 2024-05-24 | Discharge: 2024-05-24 | Disposition: A | Discharge status: Home / Self Care

## 2024-05-24 ENCOUNTER — Encounter: Payer: Self-pay | Admitting: Emergency Medicine

## 2024-05-24 DIAGNOSIS — J4521 Mild intermittent asthma with (acute) exacerbation: Secondary | ICD-10-CM

## 2024-05-24 MED ORDER — METHYLPREDNISOLONE ACETATE 40 MG/ML IJ SUSP
40.0000 mg | Freq: Once | INTRAMUSCULAR | Status: AC
  Administered 2024-05-24: 40 mg via INTRAMUSCULAR

## 2024-05-24 MED ORDER — PREDNISONE 20 MG PO TABS: 40.0000 mg | ORAL_TABLET | Freq: Every day | ORAL | 0 refills | Status: AC

## 2024-05-24 MED ORDER — BENZONATATE 100 MG PO CAPS: 100.0000 mg | ORAL_CAPSULE | Freq: Three times a day (TID) | ORAL | 0 refills | Status: DC | PRN

## 2024-05-24 MED ORDER — IPRATROPIUM-ALBUTEROL 0.5-2.5 (3) MG/3ML IN SOLN
3.0000 mL | Freq: Once | RESPIRATORY_TRACT | Status: AC
  Administered 2024-05-24: 3 mL via RESPIRATORY_TRACT

## 2024-05-24 MED ORDER — ALBUTEROL SULFATE (2.5 MG/3ML) 0.083% IN NEBU: 2.5000 mg | INHALATION_SOLUTION | Freq: Four times a day (QID) | RESPIRATORY_TRACT | 12 refills | Status: AC | PRN

## 2024-05-24 NOTE — ED Triage Notes (Addendum)
 Pt reports history of asthma. Pt reports was seen at pcp for cough, shortness of breath end of last week and inhaler was added. Pt reports last used rescue inhaler approx 1 hour prior to UC arrival.   Pt noted to have dyspnea with exertion, mild auditory wheezes noted in triage.

## 2024-05-24 NOTE — ED Provider Notes (Signed)
 [facility]    CSN: [id number] Arrival date & time: 05/24/24  1452      History   Chief Complaint Chief Complaint  Patient presents with   Shortness of Breath    HPI Alicia Russo is a 68 y.o. female.   Patient presents today with a few days of congested cough, shortness of breath, wheezing, chest tightness, chest congestion, runny and stuffy nose, and fatigue.  She denies any current chest tightness but reports it is intermittent and worse with activity.  No fever, body aches or chills, chest pain, sore throat, headache, ear pain, abdominal pain, nausea/vomiting, or diarrhea.  No significantly changed appetite.  No known sick contacts.  She reached out to primary care provider last week who resumed her daily inhaler-Qvar  and has been taking as prescribed.  She has also been using rescue albuterol  inhaler which seems to help with symptoms temporarily.  Also took Mucinex DM this morning and has not been able to tell a difference yet.    Past Medical History:  Diagnosis Date   Allergy    Anxiety    Asthma    AVM (arteriovenous malformation)    Intracranial with seizures   Closed fracture of left distal radius    Fibromyalgia    GERD (gastroesophageal reflux disease)    Hyperlipidemia    Hypothyroidism    Plantar fasciitis of right foot    Seizures (HCC)    AVM repair    Patient Active Problem List   Diagnosis Date Noted   Chronic pansinusitis 12/01/2018   Age-related osteoporosis without current pathological fracture 10/11/2017   Incomplete emptying of bladder 10/11/2017   Migraine 07/05/2017   Pure hypercholesterolemia 07/05/2017   Essential hypertension 03/10/2017   Mild intermittent asthma 10/05/2016   Fibromyalgia 10/05/2016   Gastroesophageal reflux disease without esophagitis 10/05/2016   Hypothyroidism 02/06/2011   PALPITATIONS, RECURRENT 01/06/2011    Past Surgical History:  Procedure Laterality Date   APPENDECTOMY  1982   BRAIN SURGERY   AVM 1999   CHOLECYSTECTOMY     CRANIOTOMY     for AVM   FOOT SURGERY  2016   Due to plantar fasciitis   ORIF WRIST FRACTURE Left 02/16/2017   Procedure: OPEN REDUCTION INTERNAL FIXATION (ORIF) LEFT WRIST FRACTURE;  Surgeon: Saundra Curl, MD;  Location: [facility];  Service: Orthopedics;  Laterality: Left;    OB History   No obstetric history on file.      Home Medications    Prior to Admission medications   Medication Sig Start Date End Date Taking? Authorizing Provider  albuterol  (PROVENTIL ) (2.5 MG/3ML) 0.083% nebulizer solution Take 3 mLs (2.5 mg total) by nebulization every 6 (six) hours as needed for wheezing or shortness of breath. 05/24/24  Yes Wilhemena Harbour, NP  Beclomethasone Diprop HFA (QVAR  REDIHALER IN) Inhale into the lungs.   Yes [provider]  benzonatate  (TESSALON ) 100 MG capsule Take 1 capsule (100 mg total) by mouth 3 (three) times daily as needed for cough. Do not take with alcohol or while operating or driving heavy machinery 06/03/29  Yes Thena Fireman A, NP  predniSONE  (DELTASONE ) 20 MG tablet Take 2 tablets (40 mg total) by mouth daily with breakfast for 5 days. 05/24/24 05/29/24 Yes Wilhemena Harbour, NP  albuterol  (PROVENTIL ) (2.5 MG/3ML) 0.083% nebulizer solution USE ONE vial in nebulizer EVERY 6 HOURS AS NEEDED wheezing SHORTNESS OF BREATH 08/19/18   Amos Kanner, PA-C  albuterol  (VENTOLIN  HFA) 108 (  90 Base) MCG/ACT inhaler INHALE 2 PUFFS INTO THE LUNGS EVERY 6 HOURS AS NEEDED FOR WHEEZE OR SHORTNESS OF BREATH 06/08/23   Vicky Grange M, DO  ALPRAZolam  (XANAX ) 0.5 MG tablet Take 0.5-1 tablets (0.25-0.5 mg total) by mouth 2 (two) times daily as needed for anxiety (put on file). 06/10/23   Galvin Jules, FNP  Biotin 10 MG TABS Take 1 tablet by mouth 2 (two) times daily.     [provider]  cetirizine  (ZYRTEC ) 10 MG tablet Take 1 tablet (10 mg total) by mouth daily. 11/22/19   Amos Kanner, PA-C  Cholecalciferol  (VITAMIN D3) 2000 UNITS TABS Take 1 tablet by mouth 2 (two) times daily.    [provider]  Cyanocobalamin (VITAMIN B-12 PO) Take 1 tablet by mouth 2 (two) times daily.    [provider]  denosumab  (PROLIA ) 60 MG/ML SOSY injection Inject 60 mg into the skin every 6 (six) months. 12/28/22   Eliodoro Guerin, DO  DULoxetine  (CYMBALTA ) 30 MG capsule TAKE 1 CAPSULE BY MOUTH EVERY DAY 02/21/24   Vicky Grange M, DO  ezetimibe  (ZETIA ) 10 MG tablet TAKE ONE TABLET BY MOUTH DAILY 03/24/23   Vicky Grange M, DO  fluticasone  (FLONASE ) 50 MCG/ACT nasal spray Place 2 sprays into both nostrils daily. 09/15/22   Eliodoro Guerin, DO  ibuprofen  (ADVIL ) 800 MG tablet Take 1 tablet (800 mg total) by mouth every 8 (eight) hours as needed for headache or moderate pain (pain score 4-6). 01/10/24   Eliodoro Guerin, DO  levothyroxine  (SYNTHROID ) 125 MCG tablet Take 1 tablet (125 mcg total) by mouth daily before breakfast. 11/26/23   Vicky Grange M, DO  losartan  (COZAAR ) 50 MG tablet TAKE ONE TABLET BY MOUTH DAILY 03/24/23   Vicky Grange M, DO  magnesium 30 MG tablet Take 30 mg by mouth 2 (two) times daily.    [provider]  methocarbamol  (ROBAXIN ) 500 MG tablet TAKE 1 TABLET BY MOUTH EVERY 6 HOURS AS NEEDED FOR MUSCLE SPASMS (USE SPARINGLY) 01/15/22   Gottschalk, Sharolyn Decant M, DO  montelukast  (SINGULAIR ) 10 MG tablet TAKE ONE TABLET BY MOUTH EVERY EVENING 03/24/23   Vicky Grange M, DO  nystatin  cream (MYCOSTATIN ) Apply topically. Patient not taking: Reported on 05/24/2024 09/16/21   [provider]  omeprazole  (PRILOSEC) 20 MG capsule Take 1 capsule (20 mg total) by mouth daily. 11/22/19   Amos Kanner, PA-C  Rimegepant Sulfate (NURTEC) 75 MG TBDP Take 1 tablet by mouth every other day. For migraine prevention 03/16/22   Vicky Grange M, DO  SEMAGLUTIDE ,0.25 OR 0.5MG /DOS, [place] INJECT 68 UNITS UNDER THE SKIN WEEKLY FOR 4 WEEKS, THEN INCREASE TO 96 UNITS WEEKLY  thereafter Patient not taking: Reported on 05/24/2024 08/25/23   [provider]  Semaglutide -Weight Management (WEGOVY ) 0.5 MG/0.5ML SOAJ Compounded per [facility] drug.  Advance as directed monthly. Inject subcutaneous weekly. Patient not taking: Reported on 05/24/2024 06/22/23   Eliodoro Guerin, DO    Family History Family History  Problem Relation Age of Onset   Emphysema Father        died at age 85   COPD Father    Hypertension Mother    Cancer Mother    Heart disease Mother    Vision loss Mother    Diabetes Brother    Kidney disease Brother    Diabetes Brother    Kidney disease Paternal Grandmother     Social History Social History   Tobacco Use   Smoking  status: Former    Current packs/day: 0.00    Types: Cigarettes    Start date: 04/30/1979    Quit date: 04/30/1999    Years since quitting: 25.0   Smokeless tobacco: Never  Vaping Use   Vaping status: Never Used  Substance Use Topics   Alcohol use: No    Alcohol/week: 0.0 standard drinks of alcohol   Drug use: No     Allergies   Bee venom, Cherry flavoring agent (non-screening), Citalopram hydrobromide, Gabapentin, Statins, and Tramadol   Review of Systems Review of Systems Per HPI  Physical Exam Triage Vital Signs ED Triage Vitals  Encounter Vitals Group     BP 05/24/24 1501 (!) 178/83     Systolic BP Percentile --      Diastolic BP Percentile --      Pulse Rate 05/24/24 1501 64     Resp 05/24/24 1501 (!) 22     Temp 05/24/24 1501 98.1 F (36.7 C)     Temp Source 05/24/24 1501 Oral     SpO2 05/24/24 1501 97 %     Weight --      Height --      Head Circumference --      Peak Flow --      Pain Score 05/24/24 1502 0     Pain Loc --      Pain Education --      Exclude from Growth Chart --    No data found.  Updated Vital Signs BP 136/84 (BP Location: Right Arm)   Pulse 68   Temp 98.1 F (36.7 C) (Oral)   Resp 20   SpO2 98% Comment: post neb  Visual Acuity Right Eye Distance:    Left Eye Distance:   Bilateral Distance:    Right Eye Near:   Left Eye Near:    Bilateral Near:     Physical Exam Vitals and nursing note reviewed.  Constitutional:      General: She is not in acute distress.    Appearance: Normal appearance. She is not ill-appearing or toxic-appearing.  HENT:     Head: Normocephalic and atraumatic.     Right Ear: Tympanic membrane, ear canal and external ear normal.     Left Ear: Tympanic membrane, ear canal and external ear normal.     Nose: Congestion present. No rhinorrhea.     Mouth/Throat:     Mouth: Mucous membranes are moist.     Pharynx: Oropharynx is clear. Posterior oropharyngeal erythema present. No oropharyngeal exudate.  Eyes:     General: No scleral icterus.    Extraocular Movements: Extraocular movements intact.  Cardiovascular:     Rate and Rhythm: Normal rate and regular rhythm.  Pulmonary:     Effort: Tachypnea and accessory muscle usage present. No respiratory distress.     Breath sounds: Wheezing present. No rhonchi or rales.  Musculoskeletal:     Cervical back: Normal range of motion and neck supple.  Lymphadenopathy:     Cervical: No cervical adenopathy.  Skin:    General: Skin is warm and dry.     Capillary Refill: Capillary refill takes less than 2 seconds.     Coloration: Skin is not jaundiced or pale.     Findings: No erythema or rash.  Neurological:     Mental Status: She is alert and oriented to person, place, and time.  Psychiatric:        Mood and Affect: Mood is anxious.  Behavior: Behavior is cooperative.      UC Treatments / Results  Labs (all labs ordered are listed, but only abnormal results are displayed) Labs Reviewed - No data to display  EKG   Radiology No results found.  Procedures Procedures (including critical care time)  Medications Ordered in UC Medications  ipratropium-albuterol  (DUONEB) 0.5-2.5 (3) MG/3ML nebulizer solution 3 mL (3 mLs Nebulization Given 05/24/24 1511)   methylPREDNISolone  acetate (DEPO-MEDROL ) injection 40 mg (40 mg Intramuscular Given 05/24/24 1510)    Initial Impression / Assessment and Plan / UC Course  I have reviewed the triage vital signs and the nursing notes.  Pertinent labs & imaging results that were available during my care of the patient were reviewed by me and considered in my medical decision making (see chart for details).  In triage, patient is anxious appearing, hypertensive, tachypneic, however SpO2 is normal on room air, heart rate is normal, patient is afebrile.  After DuoNeb and Depo Medrol  IM, tachypnea resolves and patient no longer hypertensive.  1. Mild intermittent asthma with acute exacerbation Vitals and exam are improved after DuoNeb and IM Depo-Medrol ; suspect exacerbation of asthma due to viral etiology Supportive care discussed with patient-continue albuterol  inhaler or nebulizer scheduled for 2 days, start oral prednisone  tomorrow morning Also recommended Tessalon  Perles, continue guaifenesin, hydration Strict ER and return precautions discussed with patient  The patient was given the opportunity to ask questions.  All questions answered to their satisfaction.  The patient is in agreement to this plan.   Final Clinical Impressions(s) / UC Diagnoses   Final diagnoses:  Mild intermittent asthma with acute exacerbation     Discharge Instructions      You are having an exacerbation of asthma likely due to a viral upper respiratory infection.  We gave you a DuoNeb today as well as an injection of steroid medicine.  Please start taking the oral prednisone  tomorrow morning.  Continue albuterol  inhaler or nebulizer every 4-6 hours scheduled for the next 2 days, then use as needed.  Continue Qvar  daily.  Also recommend continuing guaifenesin or Mucinex 600 mg twice daily to help with the congestion.  You can take the cough medicine every 8 hours as needed for cough.  If symptoms worsen, please seek care in the  emergency room.  If symptoms do not improve with treatment, please follow-up here or with primary care provider.     ED Prescriptions     Medication Sig Dispense Auth. Provider   predniSONE  (DELTASONE ) 20 MG tablet Take 2 tablets (40 mg total) by mouth daily with breakfast for 5 days. 10 tablet Thena Fireman A, NP   albuterol  (PROVENTIL ) (2.5 MG/3ML) 0.083% nebulizer solution Take 3 mLs (2.5 mg total) by nebulization every 6 (six) hours as needed for wheezing or shortness of breath. 75 mL Thena Fireman A, NP   benzonatate  (TESSALON ) 100 MG capsule Take 1 capsule (100 mg total) by mouth 3 (three) times daily as needed for cough. Do not take with alcohol or while operating or driving heavy machinery 21 capsule Wilhemena Harbour, NP      PDMP not reviewed this encounter.   Wilhemena Harbour, NP 05/24/24 1557

## 2024-05-24 NOTE — Discharge Instructions (Signed)
 You are having an exacerbation of asthma likely due to a viral upper respiratory infection.  We gave you a DuoNeb today as well as an injection of steroid medicine.  Please start taking the oral prednisone  tomorrow morning.  Continue albuterol  inhaler or nebulizer every 4-6 hours scheduled for the next 2 days, then use as needed.  Continue Qvar  daily.  Also recommend continuing guaifenesin or Mucinex 600 mg twice daily to help with the congestion.  You can take the cough medicine every 8 hours as needed for cough.  If symptoms worsen, please seek care in the emergency room.  If symptoms do not improve with treatment, please follow-up here or with primary care provider.

## 2024-05-31 ENCOUNTER — Other Ambulatory Visit

## 2024-07-04 ENCOUNTER — Telehealth: Payer: Self-pay

## 2024-07-04 DIAGNOSIS — M810 Age-related osteoporosis without current pathological fracture: Secondary | ICD-10-CM

## 2024-07-04 MED ORDER — DENOSUMAB 60 MG/ML ~~LOC~~ SOSY
60.0000 mg | PREFILLED_SYRINGE | SUBCUTANEOUS | Status: AC
  Administered 2024-08-16: 60 mg via SUBCUTANEOUS

## 2024-07-04 NOTE — Telephone Encounter (Signed)
 Prolia  sent for benefits verification

## 2024-07-04 NOTE — Telephone Encounter (Signed)
 Prolia  VOB initiated via [email or web address]  Next Prolia  inj DUE: 08/10/24

## 2024-07-05 ENCOUNTER — Other Ambulatory Visit (HOSPITAL_COMMUNITY): Payer: Self-pay

## 2024-07-05 NOTE — Telephone Encounter (Signed)
 Pt ready for scheduling for PROLIA  on or after : 08/10/24  Option# 1: Buy/Bill (Office supplied medication)  Out-of-pocket cost due at time of clinic visit: $332  Number of injection/visits approved: ---  Primary: [employer] Prolia  co-insurance: 20% Admin fee co-insurance: 0%  Secondary: --- Prolia  co-insurance:  Admin fee co-insurance:   Medical Benefit Details: Date Benefits were checked: 07/05/24 Deductible: NO/ Coinsurance: 20%/ Admin Fee: 0%  Prior Auth: N/A PA# Expiration Date:   # of doses approved: ----------------------------------------------------------------------- Option# 2- Med Obtained from pharmacy:  Pharmacy benefit: Copay $250 (Paid to pharmacy) Admin Fee: 0% (Pay at clinic)  Prior Auth: N/A PA# Expiration Date:   # of doses approved:   If patient wants fill through the pharmacy benefit please send prescription to: [facility], and include estimated need by date in rx notes. Pharmacy will ship medication directly to the office.  Patient NOT eligible for Prolia  Copay Card. Copay Card can make patient's cost as little as $25. Link to apply: [email or web address]  ** This summary of benefits is an estimation of the patient's out-of-pocket cost. Exact cost may very based on individual plan coverage.

## 2024-07-10 ENCOUNTER — Ambulatory Visit (INDEPENDENT_AMBULATORY_CARE_PROVIDER_SITE_OTHER): Payer: PPO | Admitting: Family Medicine

## 2024-07-10 ENCOUNTER — Encounter: Payer: Self-pay | Admitting: Family Medicine

## 2024-07-10 VITALS — BP 138/66 | HR 71 | Temp 97.9°F | Ht 61.0 in | Wt 197.0 lb

## 2024-07-10 DIAGNOSIS — E034 Atrophy of thyroid (acquired): Secondary | ICD-10-CM | POA: Diagnosis not present

## 2024-07-10 DIAGNOSIS — I10 Essential (primary) hypertension: Secondary | ICD-10-CM | POA: Diagnosis not present

## 2024-07-10 DIAGNOSIS — E7800 Pure hypercholesterolemia, unspecified: Secondary | ICD-10-CM

## 2024-07-10 DIAGNOSIS — R0683 Snoring: Secondary | ICD-10-CM | POA: Diagnosis not present

## 2024-07-10 DIAGNOSIS — Z23 Encounter for immunization: Secondary | ICD-10-CM | POA: Diagnosis not present

## 2024-07-10 DIAGNOSIS — J3089 Other allergic rhinitis: Secondary | ICD-10-CM | POA: Diagnosis not present

## 2024-07-10 DIAGNOSIS — Z6837 Body mass index (BMI) 37.0-37.9, adult: Secondary | ICD-10-CM | POA: Diagnosis not present

## 2024-07-10 MED ORDER — EZETIMIBE 10 MG PO TABS: 10.0000 mg | ORAL_TABLET | Freq: Every day | ORAL | 4 refills | Status: AC

## 2024-07-10 MED ORDER — FLUTICASONE PROPIONATE 50 MCG/ACT NA SUSP: 2.0000 | Freq: Every day | NASAL | 3 refills | Status: AC
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
## 2024-07-10 NOTE — Patient Instructions (Signed)
 We'll see if you qualify for Zepbound.  Go ahead and contact your insurance company and see if they will cover it for sleep apnea. I ordered the test for you.

## 2024-07-11 ENCOUNTER — Ambulatory Visit: Payer: Self-pay | Admitting: Family Medicine

## 2024-07-11 LAB — TSH+FREE T4
Free T4: 1.54 ng/dL (ref 0.82–1.77)
TSH: 0.769 u[IU]/mL (ref 0.450–4.500)

## 2024-07-13 DIAGNOSIS — G4710 Hypersomnia, unspecified: Secondary | ICD-10-CM | POA: Diagnosis not present

## 2024-07-14 DIAGNOSIS — G4710 Hypersomnia, unspecified: Secondary | ICD-10-CM | POA: Diagnosis not present

## 2024-07-18 ENCOUNTER — Encounter: Payer: Self-pay | Admitting: Family Medicine

## 2024-07-18 DIAGNOSIS — G4733 Obstructive sleep apnea (adult) (pediatric): Secondary | ICD-10-CM | POA: Insufficient documentation

## 2024-07-20 ENCOUNTER — Encounter: Payer: Self-pay | Admitting: Urology

## 2024-08-14 ENCOUNTER — Other Ambulatory Visit: Payer: Self-pay | Admitting: Family Medicine

## 2024-08-14 DIAGNOSIS — M797 Fibromyalgia: Secondary | ICD-10-CM

## 2024-08-16 ENCOUNTER — Ambulatory Visit

## 2024-08-16 DIAGNOSIS — Z23 Encounter for immunization: Secondary | ICD-10-CM | POA: Diagnosis not present

## 2024-08-16 DIAGNOSIS — M810 Age-related osteoporosis without current pathological fracture: Secondary | ICD-10-CM | POA: Diagnosis not present

## 2024-08-16 NOTE — Progress Notes (Signed)
 Patient is in office today for a nurse visit for prolia  shot. Patient Injection was given in the  Left arm. Patient tolerated injection well.  Pt also given flu shot today in right deltoid

## 2024-09-03 ENCOUNTER — Emergency Department

## 2024-09-03 ENCOUNTER — Other Ambulatory Visit: Payer: Self-pay

## 2024-09-03 ENCOUNTER — Emergency Department
Admission: EM | Admit: 2024-09-03 | Discharge: 2024-09-03 | Disposition: A | Discharge status: Home / Self Care | Attending: Emergency Medicine | Admitting: Emergency Medicine

## 2024-09-03 DIAGNOSIS — S199XXA Unspecified injury of neck, initial encounter: Secondary | ICD-10-CM | POA: Diagnosis present

## 2024-09-03 DIAGNOSIS — M47812 Spondylosis without myelopathy or radiculopathy, cervical region: Secondary | ICD-10-CM | POA: Diagnosis not present

## 2024-09-03 DIAGNOSIS — M549 Dorsalgia, unspecified: Secondary | ICD-10-CM | POA: Diagnosis not present

## 2024-09-03 DIAGNOSIS — S46911A Strain of unspecified muscle, fascia and tendon at shoulder and upper arm level, right arm, initial encounter: Secondary | ICD-10-CM | POA: Insufficient documentation

## 2024-09-03 DIAGNOSIS — E039 Hypothyroidism, unspecified: Secondary | ICD-10-CM | POA: Diagnosis not present

## 2024-09-03 DIAGNOSIS — S3992XA Unspecified injury of lower back, initial encounter: Secondary | ICD-10-CM | POA: Diagnosis not present

## 2024-09-03 DIAGNOSIS — S161XXA Strain of muscle, fascia and tendon at neck level, initial encounter: Secondary | ICD-10-CM | POA: Diagnosis not present

## 2024-09-03 DIAGNOSIS — M19011 Primary osteoarthritis, right shoulder: Secondary | ICD-10-CM | POA: Diagnosis not present

## 2024-09-03 DIAGNOSIS — S39012A Strain of muscle, fascia and tendon of lower back, initial encounter: Secondary | ICD-10-CM | POA: Diagnosis not present

## 2024-09-03 DIAGNOSIS — M51369 Other intervertebral disc degeneration, lumbar region without mention of lumbar back pain or lower extremity pain: Secondary | ICD-10-CM | POA: Diagnosis not present

## 2024-09-03 DIAGNOSIS — Z041 Encounter for examination and observation following transport accident: Secondary | ICD-10-CM | POA: Diagnosis not present

## 2024-09-03 DIAGNOSIS — M50821 Other cervical disc disorders at C4-C5 level: Secondary | ICD-10-CM | POA: Diagnosis not present

## 2024-09-03 DIAGNOSIS — M4802 Spinal stenosis, cervical region: Secondary | ICD-10-CM | POA: Diagnosis not present

## 2024-09-03 MED ORDER — OXYCODONE-ACETAMINOPHEN 5-325 MG PO TABS: 1.0000 | ORAL_TABLET | ORAL | 0 refills | Status: DC | PRN

## 2024-09-03 MED ORDER — OXYCODONE-ACETAMINOPHEN 5-325 MG PO TABS
1.0000 | ORAL_TABLET | Freq: Once | ORAL | Status: AC
  Administered 2024-09-03: 1 via ORAL
  Filled 2024-09-03: qty 1

## 2024-09-03 MED ORDER — METHOCARBAMOL 500 MG PO TABS: 500.0000 mg | ORAL_TABLET | Freq: Four times a day (QID) | ORAL | 0 refills | Status: AC

## 2024-09-03 NOTE — Discharge Instructions (Signed)
 Follow-up with orthopedics if not improving in 1 week.  You may need physical therapy to overcome the soreness Take ibuprofen  as previously prescribed, Robaxin  for muscle relaxer, Percocet as needed for pain not controlled by ibuprofen  and muscle relaxer.  Apply ice to all areas that hurt.  3 days can moved to wet heat followed by ice.  Return if worsening
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
## 2024-09-14 DIAGNOSIS — S134XXA Sprain of ligaments of cervical spine, initial encounter: Secondary | ICD-10-CM | POA: Diagnosis not present

## 2024-09-14 DIAGNOSIS — S335XXA Sprain of ligaments of lumbar spine, initial encounter: Secondary | ICD-10-CM | POA: Diagnosis not present

## 2024-09-14 DIAGNOSIS — M797 Fibromyalgia: Secondary | ICD-10-CM | POA: Diagnosis not present

## 2024-09-14 DIAGNOSIS — S139XXA Sprain of joints and ligaments of unspecified parts of neck, initial encounter: Secondary | ICD-10-CM | POA: Diagnosis not present

## 2024-09-14 DIAGNOSIS — S43401A Unspecified sprain of right shoulder joint, initial encounter: Secondary | ICD-10-CM | POA: Diagnosis not present

## 2024-09-27 DIAGNOSIS — M542 Cervicalgia: Secondary | ICD-10-CM | POA: Diagnosis not present

## 2024-09-27 DIAGNOSIS — M5451 Vertebrogenic low back pain: Secondary | ICD-10-CM | POA: Diagnosis not present

## 2024-10-04 DIAGNOSIS — M5451 Vertebrogenic low back pain: Secondary | ICD-10-CM | POA: Diagnosis not present

## 2024-10-04 DIAGNOSIS — M542 Cervicalgia: Secondary | ICD-10-CM | POA: Diagnosis not present

## 2024-10-09 ENCOUNTER — Encounter: Payer: Self-pay | Admitting: Family Medicine

## 2024-10-09 ENCOUNTER — Telehealth: Admitting: Family Medicine

## 2024-10-09 DIAGNOSIS — I10 Essential (primary) hypertension: Secondary | ICD-10-CM | POA: Diagnosis not present

## 2024-10-09 DIAGNOSIS — M797 Fibromyalgia: Secondary | ICD-10-CM | POA: Diagnosis not present

## 2024-10-09 DIAGNOSIS — F410 Panic disorder [episodic paroxysmal anxiety] without agoraphobia: Secondary | ICD-10-CM | POA: Diagnosis not present

## 2024-10-09 DIAGNOSIS — S139XXA Sprain of joints and ligaments of unspecified parts of neck, initial encounter: Secondary | ICD-10-CM | POA: Diagnosis not present

## 2024-10-09 DIAGNOSIS — F411 Generalized anxiety disorder: Secondary | ICD-10-CM | POA: Diagnosis not present

## 2024-10-09 DIAGNOSIS — F4320 Adjustment disorder, unspecified: Secondary | ICD-10-CM

## 2024-10-09 DIAGNOSIS — S3992XD Unspecified injury of lower back, subsequent encounter: Secondary | ICD-10-CM | POA: Diagnosis not present

## 2024-10-09 DIAGNOSIS — S335XXA Sprain of ligaments of lumbar spine, initial encounter: Secondary | ICD-10-CM | POA: Diagnosis not present

## 2024-10-09 DIAGNOSIS — S4991XD Unspecified injury of right shoulder and upper arm, subsequent encounter: Secondary | ICD-10-CM | POA: Diagnosis not present

## 2024-10-09 DIAGNOSIS — Z634 Disappearance and death of family member: Secondary | ICD-10-CM | POA: Diagnosis not present

## 2024-10-09 MED ORDER — ALPRAZOLAM 0.5 MG PO TABS: 0.2500 mg | ORAL_TABLET | Freq: Two times a day (BID) | ORAL | 0 refills | Status: AC | PRN

## 2024-10-09 MED ORDER — LEVOTHYROXINE SODIUM 125 MCG PO TABS: 125.0000 ug | ORAL_TABLET | Freq: Every day | ORAL | 3 refills | Status: AC

## 2024-10-09 MED ORDER — DULOXETINE HCL 30 MG PO CPEP: 30.0000 mg | ORAL_CAPSULE | Freq: Every day | ORAL | 3 refills | Status: DC

## 2024-10-09 MED ORDER — LOSARTAN POTASSIUM 50 MG PO TABS: 50.0000 mg | ORAL_TABLET | Freq: Every day | ORAL | 3 refills | Status: DC
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
## 2024-10-12 ENCOUNTER — Other Ambulatory Visit: Payer: Self-pay | Admitting: Medical Genetics

## 2024-10-12 DIAGNOSIS — Z006 Encounter for examination for normal comparison and control in clinical research program: Secondary | ICD-10-CM

## 2024-10-16 DIAGNOSIS — M542 Cervicalgia: Secondary | ICD-10-CM | POA: Diagnosis not present

## 2024-10-16 DIAGNOSIS — M5451 Vertebrogenic low back pain: Secondary | ICD-10-CM | POA: Diagnosis not present

## 2024-10-20 DIAGNOSIS — M542 Cervicalgia: Secondary | ICD-10-CM | POA: Diagnosis not present

## 2024-10-20 DIAGNOSIS — M5451 Vertebrogenic low back pain: Secondary | ICD-10-CM | POA: Diagnosis not present

## 2024-10-23 DIAGNOSIS — M542 Cervicalgia: Secondary | ICD-10-CM | POA: Diagnosis not present

## 2024-10-23 DIAGNOSIS — M5451 Vertebrogenic low back pain: Secondary | ICD-10-CM | POA: Diagnosis not present

## 2024-10-24 ENCOUNTER — Other Ambulatory Visit: Payer: Self-pay

## 2024-10-24 DIAGNOSIS — N200 Calculus of kidney: Secondary | ICD-10-CM

## 2024-10-25 ENCOUNTER — Ambulatory Visit
Admission: RE | Admit: 2024-10-25 | Discharge: 2024-10-25 | Disposition: A | Discharge status: Home / Self Care | Attending: Urology | Admitting: Urology

## 2024-10-25 ENCOUNTER — Ambulatory Visit: Admitting: Urology

## 2024-10-25 ENCOUNTER — Ambulatory Visit
Admission: RE | Admit: 2024-10-25 | Discharge: 2024-10-25 | Disposition: A | Discharge status: Home / Self Care | Source: Ambulatory Visit | Attending: Urology | Admitting: Urology

## 2024-10-25 VITALS — BP 175/96 | HR 66 | Ht 60.0 in | Wt 200.0 lb

## 2024-10-25 DIAGNOSIS — N200 Calculus of kidney: Secondary | ICD-10-CM

## 2024-10-25 DIAGNOSIS — Z87442 Personal history of urinary calculi: Secondary | ICD-10-CM | POA: Diagnosis not present

## 2024-10-25 DIAGNOSIS — M5451 Vertebrogenic low back pain: Secondary | ICD-10-CM | POA: Diagnosis not present

## 2024-10-25 DIAGNOSIS — R399 Unspecified symptoms and signs involving the genitourinary system: Secondary | ICD-10-CM | POA: Diagnosis not present

## 2024-10-25 DIAGNOSIS — M542 Cervicalgia: Secondary | ICD-10-CM | POA: Diagnosis not present

## 2024-10-25 NOTE — Progress Notes (Signed)
   10/25/2024 9:33 AM   Alicia Russo 1956-09-21 [id number]  Reason for visit: Follow up nephrolithiasis, family history of bladder and kidney cancer  History: Small right lower pole stone on surveillance, chronic low back pain Family history of bladder + kidney cancer Number of comorbidities including fibromyalgia  Physical Exam: BP (!) 175/96 (BP Location: Left Arm, Patient Position: Sitting, Cuff Size: Small)   Pulse 66   Ht 5' (1.524 m)   Wt 200 lb (90.7 kg)   SpO2 95%   BMI 39.06 kg/m   Imaging/labs: I personally viewed and interpreted the KUB today showing two stable 4 mm right lower pole stones  Today: Denies any gross hematuria or stone episodes in the last year She has some mild urinary symptoms of sensation of straining to urinate, and some stress urinary incontinence  Plan:   Nephrolithiasis: Continue monitoring for stable right lower pole stones, Stone prevention strategies reviewed Urinary symptoms: Recommended starting with Kegel exercises, handout given RTC 1 year KUB prior, PVR   Alicia JAYSON Burnet, MD  [facility] [address], [address] [place], [place] [address] [phone]

## 2024-10-25 NOTE — Patient Instructions (Signed)
Kegel Exercises  Kegel exercises can help strengthen your pelvic floor muscles. The pelvic floor is a group of muscles that support your rectum, small intestine, and bladder. In females, pelvic floor muscles also help support the uterus. These muscles help you control the flow of urine and stool (feces). Kegel exercises are painless and simple. They do not require any equipment. Your provider may suggest Kegel exercises to: Improve bladder and bowel control. Improve sexual response. Improve weak pelvic floor muscles after surgery to remove the uterus (hysterectomy) or after pregnancy, in females. Improve weak pelvic floor muscles after prostate gland removal or surgery, in males. Kegel exercises involve squeezing your pelvic floor muscles. These are the same muscles you squeeze when you try to stop the flow of urine or keep from passing gas. The exercises can be done while sitting, standing, or lying down, but it is best to vary your position. Ask your health care provider which exercises are safe for you. Do exercises exactly as told by your health care provider and adjust them as directed. Do not begin these exercises until told by your health care provider. Exercises How to do Kegel exercises: Squeeze your pelvic floor muscles tight. You should feel a tight lift in your rectal area. If you are a female, you should also feel a tightness in your vaginal area. Keep your stomach, buttocks, and legs relaxed. Hold the muscles tight for up to 10 seconds. Breathe normally. Relax your muscles for up to 10 seconds. Repeat as told by your health care provider. Repeat this exercise daily as told by your health care provider. Continue to do this exercise for at least 4-6 weeks, or for as long as told by your health care provider. You may be referred to a physical therapist who can help you learn more about how to do Kegel exercises. Depending on your condition, your health care provider may  recommend: Varying how long you squeeze your muscles. Doing several sets of exercises every day. Doing exercises for several weeks. Making Kegel exercises a part of your regular exercise routine. This information is not intended to replace advice given to you by your health care provider. Make sure you discuss any questions you have with your health care provider. Document Revised: 04/10/2021 Document Reviewed: 04/10/2021 Elsevier Patient Education  2024 Elsevier Inc.  

## 2024-10-26 ENCOUNTER — Ambulatory Visit: Payer: Self-pay | Admitting: Urology

## 2024-10-30 DIAGNOSIS — M542 Cervicalgia: Secondary | ICD-10-CM | POA: Diagnosis not present

## 2024-10-30 DIAGNOSIS — M5451 Vertebrogenic low back pain: Secondary | ICD-10-CM | POA: Diagnosis not present

## 2024-11-02 DIAGNOSIS — M5451 Vertebrogenic low back pain: Secondary | ICD-10-CM | POA: Diagnosis not present

## 2024-11-02 DIAGNOSIS — M542 Cervicalgia: Secondary | ICD-10-CM | POA: Diagnosis not present

## 2024-11-06 ENCOUNTER — Ambulatory Visit (INDEPENDENT_AMBULATORY_CARE_PROVIDER_SITE_OTHER): Payer: Self-pay | Admitting: Family Medicine

## 2024-11-06 ENCOUNTER — Encounter: Payer: Self-pay | Admitting: Family Medicine

## 2024-11-06 VITALS — BP 184/87 | HR 60 | Temp 97.5°F | Ht 60.0 in | Wt 206.5 lb

## 2024-11-06 DIAGNOSIS — M797 Fibromyalgia: Secondary | ICD-10-CM

## 2024-11-06 DIAGNOSIS — F410 Panic disorder [episodic paroxysmal anxiety] without agoraphobia: Secondary | ICD-10-CM | POA: Diagnosis not present

## 2024-11-06 DIAGNOSIS — I10 Essential (primary) hypertension: Secondary | ICD-10-CM

## 2024-11-06 DIAGNOSIS — F4320 Adjustment disorder, unspecified: Secondary | ICD-10-CM

## 2024-11-06 DIAGNOSIS — F411 Generalized anxiety disorder: Secondary | ICD-10-CM

## 2024-11-06 DIAGNOSIS — R3989 Other symptoms and signs involving the genitourinary system: Secondary | ICD-10-CM | POA: Diagnosis not present

## 2024-11-06 DIAGNOSIS — N3001 Acute cystitis with hematuria: Secondary | ICD-10-CM

## 2024-11-06 LAB — MICROSCOPIC EXAMINATION
Renal Epithel, UA: NONE SEEN /HPF
Yeast, UA: NONE SEEN

## 2024-11-06 LAB — URINALYSIS, ROUTINE W REFLEX MICROSCOPIC
Bilirubin, UA: NEGATIVE
Glucose, UA: NEGATIVE
Nitrite, UA: NEGATIVE
Specific Gravity, UA: 1.02 (ref 1.005–1.030)
Urobilinogen, Ur: 0.2 mg/dL (ref 0.2–1.0)
pH, UA: 6.5 (ref 5.0–7.5)

## 2024-11-06 MED ORDER — PHENAZOPYRIDINE HCL 200 MG PO TABS: 200.0000 mg | ORAL_TABLET | Freq: Three times a day (TID) | ORAL | 0 refills | Status: AC | PRN

## 2024-11-06 MED ORDER — CEPHALEXIN 500 MG PO CAPS: 500.0000 mg | ORAL_CAPSULE | Freq: Three times a day (TID) | ORAL | 0 refills | Status: AC

## 2024-11-06 MED ORDER — LOSARTAN POTASSIUM 50 MG PO TABS: 75.0000 mg | ORAL_TABLET | Freq: Every day | ORAL | 3 refills | Status: DC

## 2024-11-06 MED ORDER — FLUCONAZOLE 150 MG PO TABS: 150.0000 mg | ORAL_TABLET | Freq: Once | ORAL | 0 refills | Status: AC

## 2024-11-06 NOTE — Progress Notes (Signed)
 Subjective: CC: Follow-up grief PCP: Jolinda Norene HERO, DO YEP:Obww Alicia Russo is a 68 y.o. female presenting to clinic today for:  Patient seen for grief reaction on 10/09/2024.  She reports she continues to navigate grief but has not enrolled in counseling services.  She is working 5 days a week approximately 5 hours/day and the work does keep her mind off of things.  She has excellent support by her daughter and her daughter's friends, who have been really involved in keeping her company and keeping her involved.  She has only utilize 1 tablet of the alprazolam  that we prescribed during that visit.  She reports improvement in sleep after Zanaflex was added to her regimen by orthopedics.  She is currently taking methocarbamol  during the day and Zanaflex at night.  She continues to go to PT and will be seeing orthopedics on Wednesday  She does report the blood pressures have been running high over the last several weeks and have been high even at the orthopedics office.  She denies any chest pain, shortness of breath or visual disturbance and is compliant with her losartan  50 mg.  She thinks this is maybe anxiety induced but is not sure  She does report about a 2-week history of intermittent dysuria, vaginal irritation, urinary frequency and urgency.  No flank pain.  No fevers, nausea or vomiting.  She has history of renal stones but was told that they looked stable and did not require follow-up for another year  ROS: Per HPI  Allergies  Allergen Reactions   Bee Venom     Cant breath, swell up   [employer] (Non-Screening) Hives      dizziness   Citalopram Hydrobromide     Mood swings, gets really really mean   Gabapentin Other (See Comments)    Hallucinations   Statins     Myalgia and muscle pain   Tramadol     unknown   Past Medical History:  Diagnosis Date   Allergy    Anxiety    Asthma    AVM (arteriovenous malformation)    Intracranial with seizures   Closed  fracture of left distal radius    Fibromyalgia    GERD (gastroesophageal reflux disease)    Hyperlipidemia    Hypothyroidism    Plantar fasciitis of right foot    Seizures (HCC)    AVM repair    Current Outpatient Medications:    albuterol  (PROVENTIL ) (2.5 MG/3ML) 0.083% nebulizer solution, Take 3 mLs (2.5 mg total) by nebulization every 6 (six) hours as needed for wheezing or shortness of breath., Disp: 75 mL, Rfl: 12   albuterol  (VENTOLIN  HFA) 108 (90 Base) MCG/ACT inhaler, INHALE 2 PUFFS INTO THE LUNGS EVERY 6 HOURS AS NEEDED FOR WHEEZE OR SHORTNESS OF BREATH, Disp: 8.5 g, Rfl: 2   ALPRAZolam  (XANAX ) 0.5 MG tablet, Take 0.5-1 tablets (0.25-0.5 mg total) by mouth 2 (two) times daily as needed for anxiety., Disp: 40 tablet, Rfl: 0   Beclomethasone Diprop HFA (QVAR  REDIHALER IN), Inhale into the lungs., Disp: , Rfl:    Biotin 10 MG TABS, Take 1 tablet by mouth 2 (two) times daily. , Disp: , Rfl:    cetirizine  (ZYRTEC ) 10 MG tablet, Take 1 tablet (10 mg total) by mouth daily., Disp: 90 tablet, Rfl: 3   Cyanocobalamin (VITAMIN B-12 PO), Take 1 tablet by mouth 2 (two) times daily., Disp: , Rfl:    denosumab  (PROLIA ) 60 MG/ML SOSY injection, Inject 60 mg into the  skin every 6 (six) months., Disp: 1 mL, Rfl: 0   DULoxetine  (CYMBALTA ) 30 MG capsule, Take 1 capsule (30 mg total) by mouth daily., Disp: 90 capsule, Rfl: 3   ezetimibe  (ZETIA ) 10 MG tablet, Take 1 tablet (10 mg total) by mouth daily., Disp: 90 tablet, Rfl: 4   fluticasone  (FLONASE ) 50 MCG/ACT nasal spray, Place 2 sprays into both nostrils daily., Disp: 48 g, Rfl: 3   levothyroxine  (SYNTHROID ) 125 MCG tablet, Take 1 tablet (125 mcg total) by mouth daily before breakfast., Disp: 90 tablet, Rfl: 3   magnesium 30 MG tablet, Take 30 mg by mouth 2 (two) times daily., Disp: , Rfl:    meloxicam  (MOBIC ) 15 MG tablet, Take 15 mg by mouth daily., Disp: , Rfl:    methocarbamol  (ROBAXIN ) 500 MG tablet, Take 500 mg by mouth every 8 (eight) hours as  needed for muscle spasms., Disp: , Rfl:    montelukast  (SINGULAIR ) 10 MG tablet, TAKE ONE TABLET BY MOUTH EVERY EVENING, Disp: 90 tablet, Rfl: 0   omeprazole  (PRILOSEC) 20 MG capsule, Take 1 capsule (20 mg total) by mouth daily., Disp: 90 capsule, Rfl: 3   Rimegepant Sulfate (NURTEC) 75 MG TBDP, Take 1 tablet by mouth every other day. For migraine prevention, Disp: 16 tablet, Rfl: prn   Cholecalciferol (VITAMIN D3) 2000 UNITS TABS, Take 1 tablet by mouth 2 (two) times daily. (Patient not taking: Reported on 11/06/2024), Disp: , Rfl:    losartan  (COZAAR ) 50 MG tablet, Take 1.5 tablets (75 mg total) by mouth daily., Disp: 135 tablet, Rfl: 3   nystatin  cream (MYCOSTATIN ), Apply topically. (Patient not taking: Reported on 11/06/2024), Disp: , Rfl:   Current Facility-Administered Medications:    denosumab  (PROLIA ) injection 60 mg, 60 mg, Subcutaneous, Q6 months, Bralen Wiltgen M, DO, 60 mg at 08/16/24 1548 Social History   Socioeconomic History   Marital status: Single    Spouse name: Not on file   Number of children: 2   Years of education: Not on file   Highest education level: GED or equivalent  Occupational History    Comment: disability  Tobacco Use   Smoking status: Former    Current packs/day: 0.00    Types: Cigarettes    Start date: 04/30/1979    Quit date: 04/30/1999    Years since quitting: 25.5   Smokeless tobacco: Never  Vaping Use   Vaping status: Never Used  Substance and Sexual Activity   Alcohol use: No    Alcohol/week: 0.0 standard drinks of alcohol   Drug use: No   Sexual activity: Not Currently  Other Topics Concern   Not on file  Social History Narrative   Lives alone. Her mother lives 15 minutes away. Both children out of town, but daughter visits often.   Social Drivers of Corporate Investment Banker Strain: Low Risk  (11/02/2024)   Overall Financial Resource Strain (CARDIA)    Difficulty of Paying Living Expenses: Not very hard  Food Insecurity: No Food  Insecurity (11/02/2024)   Hunger Vital Sign    Worried About Running Out of Food in the Last Year: Never true    Ran Out of Food in the Last Year: Never true  Transportation Needs: No Transportation Needs (11/02/2024)   PRAPARE - Administrator, Civil Service (Medical): No    Lack of Transportation (Non-Medical): No  Physical Activity: Insufficiently Active (11/02/2024)   Exercise Vital Sign    Days of Exercise per Week: 3 days  Minutes of Exercise per Session: 30 min  Stress: No Stress Concern Present (11/02/2024)   [employer] of Occupational Health - Occupational Stress Questionnaire    Feeling of Stress: Only a little  Social Connections: Moderately Integrated (11/02/2024)   Social Connection and Isolation Panel    Frequency of Communication with Friends and Family: More than three times a week    Frequency of Social Gatherings with Friends and Family: Once a week    Attends Religious Services: More than 4 times per year    Active Member of [employer] or Organizations: Yes    Attends Banker Meetings: More than 4 times per year    Marital Status: Widowed  Intimate Partner Violence: Not At Risk (04/07/2024)   Humiliation, Afraid, Rape, and Kick questionnaire    Fear of Current or Ex-Partner: No    Emotionally Abused: No    Physically Abused: No    Sexually Abused: No   Family History  Problem Relation Age of Onset   Emphysema Father        died at age 93   COPD Father    Hypertension Mother    Cancer Mother    Heart disease Mother    Vision loss Mother    Diabetes Brother    Kidney disease Brother    Diabetes Brother    Kidney disease Paternal Grandmother     Objective: Office vital signs reviewed. BP (!) 184/87   Pulse 60   Temp (!) 97.5 F (36.4 C)   Ht 5' (1.524 m)   Wt 206 lb 8 oz (93.7 kg)   SpO2 96%   BMI 40.33 kg/m   Physical Examination:  General: Awake, alert, morbidly obese female, No acute distress HEENT: sclera white,  MMM Cardio: regular rate and rhythm, S1S2 heard, no murmurs appreciated Pulm: clear to auscultation bilaterally, no wheezes, rhonchi or rales; normal work of breathing on room air GU: no CVA TTP     11/06/2024    1:29 PM 07/10/2024   12:56 PM 04/07/2024    1:39 PM  Depression screen PHQ 2/9  Decreased Interest 1 0 0  Down, Depressed, Hopeless 2 0 0  PHQ - 2 Score 3 0 0  Altered sleeping 2 0 0  Tired, decreased energy 2 0 0  Change in appetite 1 0 0  Feeling bad or failure about yourself  0 0 0  Trouble concentrating 1 0 0  Moving slowly or fidgety/restless 0 0 0  Suicidal thoughts 0 0 0  PHQ-9 Score 9 0  0   Difficult doing work/chores Somewhat difficult Not difficult at all Not difficult at all     Data saved with a previous flowsheet row definition      11/06/2024    1:30 PM 07/10/2024   12:56 PM 01/10/2024    1:06 PM 01/10/2024    1:01 PM  GAD 7 : Generalized Anxiety Score  Nervous, Anxious, on Edge 2 0 1 0  Control/stop worrying 1 0 0 0  Worry too much - different things 2 0 0 0  Trouble relaxing 2 0 1 0  Restless 2 0 1 0  Easily annoyed or irritable 2 0 0 0  Afraid - awful might happen 0 0 0 0  Total GAD 7 Score 11 0 3 0  Anxiety Difficulty Not difficult at all Not difficult at all Not difficult at all Not difficult at all    Assessment/ Plan: 68 y.o. female   Grief reaction -  Plan: ToxASSURE Select 13 (MW), Urine  Generalized anxiety disorder with panic attacks - Plan: ToxASSURE Select 13 (MW), Urine  Fibromyalgia  Essential hypertension - Plan: losartan  (COZAAR ) 50 MG tablet  Acute cystitis with hematuria - Plan: Urinalysis, Routine w reflex microscopic, Urine Culture, cephALEXin  (KEFLEX ) 500 MG capsule, fluconazole  (DIFLUCAN ) 150 MG tablet, phenazopyridine  (PYRIDIUM ) 200 MG tablet   Grief reaction as expected.  Reinforced consideration for counseling services with grief counseling through hospice.  Will update UDS and CSC and the national narcotic database  was reviewed with no red flags.  No additional refills of Xanax  needed at this time  Fibromyalgia getting slightly better with PT for her orthopedic issues  Blood pressure not at goal to increase to 75 mg.  Continue to monitor blood pressures at home.  Goal less than 150/90  Urinalysis with some bacteria and blood.  Sent for culture.  Keflex  started.  Fluconazole  as needed.  Pyridium  as needed.  Discussed red flag signs symptoms warranting further evaluation and she voiced good understanding   Alicia Russo Fielding, DO [facility] [phone]

## 2024-11-08 DIAGNOSIS — M5412 Radiculopathy, cervical region: Secondary | ICD-10-CM | POA: Diagnosis not present

## 2024-11-08 DIAGNOSIS — M542 Cervicalgia: Secondary | ICD-10-CM | POA: Diagnosis not present

## 2024-11-08 DIAGNOSIS — S139XXA Sprain of joints and ligaments of unspecified parts of neck, initial encounter: Secondary | ICD-10-CM | POA: Diagnosis not present

## 2024-11-08 DIAGNOSIS — S39012D Strain of muscle, fascia and tendon of lower back, subsequent encounter: Secondary | ICD-10-CM | POA: Diagnosis not present

## 2024-11-08 DIAGNOSIS — M5451 Vertebrogenic low back pain: Secondary | ICD-10-CM | POA: Diagnosis not present

## 2024-11-08 DIAGNOSIS — S161XXD Strain of muscle, fascia and tendon at neck level, subsequent encounter: Secondary | ICD-10-CM | POA: Diagnosis not present

## 2024-11-09 LAB — URINE CULTURE

## 2024-11-10 ENCOUNTER — Other Ambulatory Visit: Payer: Self-pay | Admitting: Family Medicine

## 2024-11-10 DIAGNOSIS — M797 Fibromyalgia: Secondary | ICD-10-CM

## 2024-11-10 LAB — TOXASSURE SELECT 13 (MW), URINE

## 2024-12-21 NOTE — Progress Notes (Signed)
 Alicia Russo                                          MRN: [id number]   12/21/2024   The [facility] Quality Team Specialist reviewed this patient medical record for the purposes of chart review for care gap closure. The following were reviewed: chart review for care gap closure-controlling blood pressure.    [facility] Quality Team

## 2024-12-29 ENCOUNTER — Ambulatory Visit (INDEPENDENT_AMBULATORY_CARE_PROVIDER_SITE_OTHER): Admitting: Family Medicine

## 2024-12-29 VITALS — BP 188/84 | HR 62 | Temp 98.2°F | Ht 60.0 in | Wt 207.6 lb

## 2024-12-29 DIAGNOSIS — R6889 Other general symptoms and signs: Secondary | ICD-10-CM

## 2024-12-29 DIAGNOSIS — J4520 Mild intermittent asthma, uncomplicated: Secondary | ICD-10-CM | POA: Diagnosis not present

## 2024-12-29 DIAGNOSIS — I10 Essential (primary) hypertension: Secondary | ICD-10-CM

## 2024-12-29 DIAGNOSIS — J029 Acute pharyngitis, unspecified: Secondary | ICD-10-CM

## 2024-12-29 LAB — VERITOR SARS-COV-2 AND FLU A+B
BD Veritor SARS-CoV-2 Ag: NEGATIVE
Influenza A: NEGATIVE
Influenza B: NEGATIVE

## 2024-12-29 LAB — CULTURE, GROUP A STREP

## 2024-12-29 LAB — RAPID STREP SCREEN (MED CTR MEBANE ONLY): Strep Gp A Ag, IA W/Reflex: NEGATIVE

## 2024-12-29 MED ORDER — LOSARTAN POTASSIUM 50 MG PO TABS: 100.0000 mg | ORAL_TABLET | Freq: Every day | ORAL | 3 refills | Status: DC

## 2024-12-29 MED ORDER — OSELTAMIVIR PHOSPHATE 75 MG PO CAPS: 75.0000 mg | ORAL_CAPSULE | Freq: Two times a day (BID) | ORAL | 0 refills | Status: AC

## 2024-12-29 MED ORDER — ALBUTEROL SULFATE HFA 108 (90 BASE) MCG/ACT IN AERS: 2.0000 | INHALATION_SPRAY | Freq: Four times a day (QID) | RESPIRATORY_TRACT | 2 refills | Status: AC | PRN

## 2024-12-29 NOTE — Patient Instructions (Signed)
 Follow up if symptoms acutely worsen, no improvement over the next 2-3 days, fever develops, or for any other concerns

## 2024-12-29 NOTE — Progress Notes (Signed)
 "  Acute Office Visit  Patient ID: Alicia Russo, female    DOB: 1956/11/17, 69 y.o.   MRN: [id number]  PCP: Jolinda Norene HERO, DO  Chief Complaint  Patient presents with   SICK VISIT    Sore throat, congestion, body aches, chills, right ear pain since yesterday. Patient says symptoms caused her to have an asthma flare up. She is using her inhaler.     Subjective:     HPI  Discussed the use of AI scribe software for clinical note transcription with the patient, who gave verbal consent to proceed.  History of Present Illness   Alicia Russo is a 69 year old female who presents with flu like symptoms.    Upper respiratory symptoms - Congestion, cough, chills, and congestion began yesterday. - Patient states symptoms are consistent with previous flu diagnosis.  - Darden was sick for a few days last week and the patient was exposed to him  Otic symptoms - Right ear pain present  Fever - No measured fever  Asthma - Started using Qvar  inhaler yesterday - Albuterol  inhaler also being used as needed.  - No current shortness of breath or chest pain reported  Htn - Patient reports that her blood pressure has been running high with systolic pressures in the 170s despite her blood pressure medicine being increased. - Patient states that she has taken Allegra-D.       ROS     Objective:    BP (!) 188/84 (Cuff Size: Small)   Pulse 62   Temp 98.2 F (36.8 C)   Ht 5' (1.524 m)   Wt 207 lb 9.6 oz (94.2 kg)   SpO2 96%   BMI 40.54 kg/m    Physical Exam Vitals reviewed.  Constitutional:      Appearance: Normal appearance.  HENT:     Head: Normocephalic and atraumatic.     Right Ear: Tympanic membrane, ear canal and external ear normal.     Left Ear: Tympanic membrane, ear canal and external ear normal.     Nose: Nose normal.     Mouth/Throat:     Mouth: Mucous membranes are moist.     Pharynx: Oropharynx is clear.  Eyes:     Extraocular Movements:  Extraocular movements intact.     Conjunctiva/sclera: Conjunctivae normal.     Pupils: Pupils are equal, round, and reactive to light.  Cardiovascular:     Rate and Rhythm: Normal rate and regular rhythm.     Pulses: Normal pulses.     Heart sounds: Normal heart sounds. No murmur heard. Pulmonary:     Effort: Pulmonary effort is normal. No respiratory distress.     Breath sounds: Normal breath sounds. No wheezing or rhonchi.  Musculoskeletal:        General: Normal range of motion.     Cervical back: Normal range of motion.  Skin:    General: Skin is warm and dry.  Neurological:     General: No focal deficit present.     Mental Status: She is alert and oriented to person, place, and time.  Psychiatric:        Mood and Affect: Mood normal.        Behavior: Behavior normal.       No results found for any visits on 12/29/24.     Assessment & Plan:   Problem List Items Addressed This Visit       Cardiovascular and Mediastinum   Essential hypertension  Relevant Medications   losartan  (COZAAR ) 50 MG tablet     Respiratory   Mild intermittent asthma   Relevant Medications   albuterol  (VENTOLIN  HFA) 108 (90 Base) MCG/ACT inhaler   Other Visit Diagnoses       Flu-like symptoms    -  Primary   Relevant Medications   oseltamivir  (TAMIFLU ) 75 MG capsule   Other Relevant Orders   Veritor SARS-CoV-2 and Flu A+B     Sore throat       Relevant Orders   Rapid Strep Screen ([facility] ONLY)       Assessment and Plan    Acute upper respiratory infection - COVID, flu, and strep negative today - Discussed with patient that since her symptoms have only been present for 1 day that viral testing could be falsely negative.  We are currently in a period of high flu cases. - Due to history of asthma I offered Tamiflu  as the flu test could be falsely negative at this point. - Discussed potential side effects of Tamiflu .  Patient states that she has taken in the past without  side effects. - Patient voiced interest in starting the Tamiflu . - Tamiflu  ordered - Refilled albuterol  per request - Follow up if symptoms acutely worsen, no improvement over the next 2-3 days, fever develops, or for any other concerns   Mild intermittent asthma - Continue current asthma management with Qvar  and Albuterol  inhalers as prescribed.   Hypertension - Increased losartan  to 100 mg daily. - Monitor blood pressure at home and keep log.  Discussed proper technique for obtaining blood pressure - Follow up with PCP in 1 week for reevaluation of blood pressure. - Follow-up sooner if blood pressure remains greater than 180/100.  - Recommended against use of decongestions with pseudoephedrine as that can increase blood pressure.        Meds ordered this encounter  Medications   oseltamivir  (TAMIFLU ) 75 MG capsule    Sig: Take 1 capsule (75 mg total) by mouth 2 (two) times daily for 5 days.    Dispense:  10 capsule    Refill:  0    Supervising Provider:   GOTTSCHALK, ASHLY M [[id number]]   albuterol  (VENTOLIN  HFA) 108 (90 Base) MCG/ACT inhaler    Sig: Inhale 2 puffs into the lungs every 6 (six) hours as needed for wheezing or shortness of breath.    Dispense:  18 g    Refill:  2    Supervising Provider:   GOTTSCHALK, ASHLY M [[id number]]   losartan  (COZAAR ) 50 MG tablet    Sig: Take 2 tablets (100 mg total) by mouth daily.    Dispense:  135 tablet    Refill:  3    Supervising Provider:   JOLINDA NORENE HERO [[id number]]    Return if symptoms worsen or fail to improve.  Alicia LELON Severin, FNP [facility]   "

## 2025-01-11 NOTE — Progress Notes (Signed)
 "  Subjective: CC:HTN PCP: Jolinda Norene HERO, DO YEP:Obww GORMAN Brodman is a 69 y.o. female presenting to clinic today for:  Losartan  increased to 100mg  daily.  However, after further discussion today she ports that she was told to only take the extra tablet if her blood pressure was above 140/90.  She is only taken 1 dose of the extra 50 mg.  She has been monitoring blood pressures at home and they are consistently below 140s over 90s except 1 day when she had a migraine headache it was elevated.  She reports no ongoing headaches, visual service, chest pain, shortness of breath  She does report polymyalgia and arthralgia today due to weather and fibromyalgia being uncontrolled today.  Utilizes Robaxin  during the day but it is not as efficacious as the Zanaflex  and she is asking to have that back for bedtime use as needed.   ROS: Per HPI  Allergies[1] Past Medical History:  Diagnosis Date   Allergy    Anxiety    Asthma    AVM (arteriovenous malformation)    Intracranial with seizures   Closed fracture of left distal radius    Fibromyalgia    GERD (gastroesophageal reflux disease)    Hyperlipidemia    Hypothyroidism    Plantar fasciitis of right foot    Seizures (HCC)    AVM repair   Current Medications[2] Social History   Socioeconomic History   Marital status: Single    Spouse name: Not on file   Number of children: 2   Years of education: Not on file   Highest education level: GED or equivalent  Occupational History    Comment: disability  Tobacco Use   Smoking status: Former    Current packs/day: 0.00    Types: Cigarettes    Start date: 04/30/1979    Quit date: 04/30/1999    Years since quitting: 25.7   Smokeless tobacco: Never  Vaping Use   Vaping status: Never Used  Substance and Sexual Activity   Alcohol use: No    Alcohol/week: 0.0 standard drinks of alcohol   Drug use: No   Sexual activity: Not Currently  Other Topics Concern   Not on file  Social History  Narrative   Lives alone. Her mother lives 15 minutes away. Both children out of town, but daughter visits often.   Social Drivers of Health   Tobacco Use: Medium Risk (11/06/2024)   Patient History    Smoking Tobacco Use: Former    Smokeless Tobacco Use: Never    Passive Exposure: Not on file  Financial Resource Strain: Low Risk (11/02/2024)   Overall Financial Resource Strain (CARDIA)    Difficulty of Paying Living Expenses: Not very hard  Food Insecurity: No Food Insecurity (11/02/2024)   Epic    Worried About Programme Researcher, Broadcasting/film/video in the Last Year: Never true    Ran Out of Food in the Last Year: Never true  Transportation Needs: No Transportation Needs (11/02/2024)   Epic    Lack of Transportation (Medical): No    Lack of Transportation (Non-Medical): No  Physical Activity: Insufficiently Active (11/02/2024)   Exercise Vital Sign    Days of Exercise per Week: 3 days    Minutes of Exercise per Session: 30 min  Stress: No Stress Concern Present (11/02/2024)   [employer] of Occupational Health - Occupational Stress Questionnaire    Feeling of Stress: Only a little  Social Connections: Moderately Integrated (11/02/2024)   Social Connection and Isolation Panel  Frequency of Communication with Friends and Family: More than three times a week    Frequency of Social Gatherings with Friends and Family: Once a week    Attends Religious Services: More than 4 times per year    Active Member of [employer] or Organizations: Yes    Attends Banker Meetings: More than 4 times per year    Marital Status: Widowed  Intimate Partner Violence: Not At Risk (04/07/2024)   Humiliation, Afraid, Rape, and Kick questionnaire    Fear of Current or Ex-Partner: No    Emotionally Abused: No    Physically Abused: No    Sexually Abused: No  Depression (PHQ2-9): Medium Risk (11/06/2024)   Depression (PHQ2-9)    PHQ-2 Score: 9  Alcohol Screen: Low Risk (04/07/2024)   Alcohol Screen     Last Alcohol Screening Score (AUDIT): 0  Housing: Low Risk (11/02/2024)   Epic    Unable to Pay for Housing in the Last Year: No    Number of Times Moved in the Last Year: 0    Homeless in the Last Year: No  Utilities: Not At Risk (04/07/2024)   AHC Utilities    Threatened with loss of utilities: No  Health Literacy: Adequate Health Literacy (04/07/2024)   B1300 Health Literacy    Frequency of need for help with medical instructions: Never   Family History  Problem Relation Age of Onset   Emphysema Father        died at age 20   COPD Father    Hypertension Mother    Cancer Mother    Heart disease Mother    Vision loss Mother    Diabetes Brother    Kidney disease Brother    Diabetes Brother    Kidney disease Paternal Grandmother     Objective: Office vital signs reviewed. BP 127/75   Pulse 65   Temp 97.9 F (36.6 C)   Ht 5' (1.524 m)   Wt 207 lb 6 oz (94.1 kg)   SpO2 96%   BMI 40.50 kg/m   Physical Examination:  General: Awake, alert, well nourished, morbidly obese female.  No acute distress HEENT: Sclera white.  Moist mucous membranes Cardio: regular rate and rhythm, S1S2 heard, no murmurs appreciated Pulm: clear to auscultation bilaterally, no wheezes, rhonchi or rales; normal work of breathing on room air MSK: Ambulating independently with antalgic gait and station  Assessment/ Plan: 69 y.o. female   Essential hypertension - Plan: Basic Metabolic Panel, losartan  (COZAAR ) 50 MG tablet, hydrochlorothiazide  (MICROZIDE ) 12.5 MG capsule  Fibromyalgia - Plan: tiZANidine  (ZANAFLEX ) 4 MG tablet, methylPREDNISolone  acetate (DEPO-MEDROL ) injection 40 mg, DISCONTINUED: methylPREDNISolone  acetate (DEPO-MEDROL ) injection 40 mg   Home blood pressure readings are dependable and were near the readings we had here in the office.  Continue Cozaar  50 mg daily.  I canceled the 100 mg daily prescription and added HCTZ as needed edema and or blood pressure above 140/90 as this is much  quicker onset  She was given a Depo-Medrol  intramuscularly today to help with discomfort.  Have added Zanaflex  for as needed use at bedtime again.   Norene CHRISTELLA Fielding, DO [facility] [phone]     [1]  Allergies Allergen Reactions   Bee Venom     Cant breath, swell up   Cherry Flavoring Agent (Non-Screening) Hives      dizziness   Citalopram Hydrobromide     Mood swings, gets really really mean   Gabapentin Other (See Comments)  Hallucinations   Statins     Myalgia and muscle pain   Tramadol     unknown  [2]  Current Outpatient Medications:    albuterol  (PROVENTIL ) (2.5 MG/3ML) 0.083% nebulizer solution, Take 3 mLs (2.5 mg total) by nebulization every 6 (six) hours as needed for wheezing or shortness of breath., Disp: 75 mL, Rfl: 12   albuterol  (VENTOLIN  HFA) 108 (90 Base) MCG/ACT inhaler, INHALE 2 PUFFS INTO THE LUNGS EVERY 6 HOURS AS NEEDED FOR WHEEZE OR SHORTNESS OF BREATH, Disp: 8.5 g, Rfl: 2   albuterol  (VENTOLIN  HFA) 108 (90 Base) MCG/ACT inhaler, Inhale 2 puffs into the lungs every 6 (six) hours as needed for wheezing or shortness of breath., Disp: 18 g, Rfl: 2   ALPRAZolam  (XANAX ) 0.5 MG tablet, Take 0.5-1 tablets (0.25-0.5 mg total) by mouth 2 (two) times daily as needed for anxiety., Disp: 40 tablet, Rfl: 0   Beclomethasone Diprop HFA (QVAR  REDIHALER IN), Inhale into the lungs., Disp: , Rfl:    Biotin 10 MG TABS, Take 1 tablet by mouth 2 (two) times daily. , Disp: , Rfl:    cetirizine  (ZYRTEC ) 10 MG tablet, Take 1 tablet (10 mg total) by mouth daily., Disp: 90 tablet, Rfl: 3   Cholecalciferol (VITAMIN D3) 2000 UNITS TABS, Take 1 tablet by mouth 2 (two) times daily. (Patient not taking: Reported on 12/29/2024), Disp: , Rfl:    Cyanocobalamin (VITAMIN B-12 PO), Take 1 tablet by mouth 2 (two) times daily., Disp: , Rfl:    denosumab  (PROLIA ) 60 MG/ML SOSY injection, Inject 60 mg into the skin every 6 (six) months., Disp: 1 mL, Rfl: 0    DULoxetine  (CYMBALTA ) 30 MG capsule, TAKE 1 CAPSULE BY MOUTH EVERY DAY, Disp: 90 capsule, Rfl: 1   ezetimibe  (ZETIA ) 10 MG tablet, Take 1 tablet (10 mg total) by mouth daily., Disp: 90 tablet, Rfl: 4   fluticasone  (FLONASE ) 50 MCG/ACT nasal spray, Place 2 sprays into both nostrils daily., Disp: 48 g, Rfl: 3   levothyroxine  (SYNTHROID ) 125 MCG tablet, Take 1 tablet (125 mcg total) by mouth daily before breakfast., Disp: 90 tablet, Rfl: 3   losartan  (COZAAR ) 50 MG tablet, Take 2 tablets (100 mg total) by mouth daily., Disp: 135 tablet, Rfl: 3   magnesium 30 MG tablet, Take 30 mg by mouth 2 (two) times daily., Disp: , Rfl:    meloxicam  (MOBIC ) 15 MG tablet, Take 15 mg by mouth daily. (Patient not taking: Reported on 12/29/2024), Disp: , Rfl:    methocarbamol  (ROBAXIN ) 500 MG tablet, Take 500 mg by mouth every 8 (eight) hours as needed for muscle spasms., Disp: , Rfl:    montelukast  (SINGULAIR ) 10 MG tablet, TAKE ONE TABLET BY MOUTH EVERY EVENING, Disp: 90 tablet, Rfl: 0   nystatin  cream (MYCOSTATIN ), Apply topically. (Patient taking differently: Apply topically as needed.), Disp: , Rfl:    omeprazole  (PRILOSEC) 20 MG capsule, Take 1 capsule (20 mg total) by mouth daily., Disp: 90 capsule, Rfl: 3   Rimegepant Sulfate (NURTEC) 75 MG TBDP, Take 1 tablet by mouth every other day. For migraine prevention, Disp: 16 tablet, Rfl: prn  Current Facility-Administered Medications:    denosumab  (PROLIA ) injection 60 mg, 60 mg, Subcutaneous, Q6 months, Brighid Koch M, DO, 60 mg at 08/16/24 1548  "

## 2025-01-12 ENCOUNTER — Encounter: Payer: Self-pay | Admitting: Family Medicine

## 2025-01-12 ENCOUNTER — Ambulatory Visit (INDEPENDENT_AMBULATORY_CARE_PROVIDER_SITE_OTHER): Admitting: Family Medicine

## 2025-01-12 VITALS — BP 127/75 | HR 65 | Temp 97.9°F | Ht 60.0 in | Wt 207.4 lb

## 2025-01-12 DIAGNOSIS — M797 Fibromyalgia: Secondary | ICD-10-CM

## 2025-01-12 DIAGNOSIS — I10 Essential (primary) hypertension: Secondary | ICD-10-CM | POA: Diagnosis not present

## 2025-01-12 MED ORDER — METHYLPREDNISOLONE ACETATE 80 MG/ML IJ SUSP
40.0000 mg | Freq: Once | INTRAMUSCULAR | Status: AC
  Administered 2025-01-12: 40 mg via INTRAMUSCULAR

## 2025-01-12 MED ORDER — HYDROCHLOROTHIAZIDE 12.5 MG PO CAPS: 12.5000 mg | ORAL_CAPSULE | Freq: Every day | ORAL | 3 refills | Status: AC | PRN

## 2025-01-12 MED ORDER — METHYLPREDNISOLONE ACETATE 80 MG/ML IJ SUSP: 40.0000 mg | Freq: Once | INTRAMUSCULAR | Status: DC

## 2025-01-12 MED ORDER — LOSARTAN POTASSIUM 50 MG PO TABS: 50.0000 mg | ORAL_TABLET | Freq: Every day | ORAL | 3 refills | Status: AC

## 2025-01-12 MED ORDER — TIZANIDINE HCL 4 MG PO TABS: 2.0000 mg | ORAL_TABLET | Freq: Every evening | ORAL | 2 refills | Status: AC | PRN

## 2025-03-13 ENCOUNTER — Encounter: Payer: Self-pay | Admitting: Family Medicine

## 2025-04-13 ENCOUNTER — Ambulatory Visit

## 2025-04-17 ENCOUNTER — Ambulatory Visit: Payer: Self-pay

## 2025-10-25 ENCOUNTER — Ambulatory Visit: Admitting: Physician Assistant
